# Patient Record
Sex: Female | Born: 1940
Health system: Southern US, Community
[De-identification: ages and names within clinical notes are randomized; demographics above are authoritative.]

## PROBLEM LIST (undated history)

## (undated) DIAGNOSIS — D72819 Decreased white blood cell count, unspecified: Secondary | ICD-10-CM

## (undated) DIAGNOSIS — N189 Chronic kidney disease, unspecified: Secondary | ICD-10-CM

## (undated) DIAGNOSIS — T7840XA Allergy, unspecified, initial encounter: Secondary | ICD-10-CM

## (undated) DIAGNOSIS — K219 Gastro-esophageal reflux disease without esophagitis: Secondary | ICD-10-CM

## (undated) DIAGNOSIS — E785 Hyperlipidemia, unspecified: Secondary | ICD-10-CM

## (undated) HISTORY — PX: TUBAL LIGATION: SHX77

## (undated) HISTORY — PX: TONSILLECTOMY: SUR1361

## (undated) HISTORY — PX: APPENDECTOMY: SHX54

## (undated) HISTORY — DX: Decreased white blood cell count, unspecified: D72.819

## (undated) HISTORY — PX: COLONOSCOPY: SHX174

## (undated) HISTORY — DX: Hyperlipidemia, unspecified: E78.5

## (undated) HISTORY — PX: VAGINAL HYSTERECTOMY: SUR661

## (undated) HISTORY — DX: Allergy, unspecified, initial encounter: T78.40XA

## (undated) HISTORY — DX: Chronic kidney disease, unspecified: N18.9

## (undated) HISTORY — DX: Gastro-esophageal reflux disease without esophagitis: K21.9

## (undated) HISTORY — PX: EYE SURGERY: SHX253

---

## 2003-01-25 ENCOUNTER — Other Ambulatory Visit: Admission: RE | Admit: 2003-01-25 | Discharge: 2003-01-25 | Payer: Self-pay | Admitting: Obstetrics and Gynecology

## 2003-05-27 ENCOUNTER — Ambulatory Visit (HOSPITAL_COMMUNITY): Admission: RE | Admit: 2003-05-27 | Discharge: 2003-05-27 | Payer: Self-pay | Admitting: Obstetrics and Gynecology

## 2003-05-27 ENCOUNTER — Encounter: Payer: Self-pay | Admitting: Obstetrics and Gynecology

## 2004-02-17 ENCOUNTER — Other Ambulatory Visit: Admission: RE | Admit: 2004-02-17 | Discharge: 2004-02-17 | Payer: Self-pay | Admitting: Obstetrics and Gynecology

## 2004-08-15 ENCOUNTER — Ambulatory Visit: Payer: Self-pay | Admitting: Internal Medicine

## 2004-08-18 ENCOUNTER — Ambulatory Visit: Payer: Self-pay | Admitting: Internal Medicine

## 2006-02-22 ENCOUNTER — Encounter: Admission: RE | Admit: 2006-02-22 | Discharge: 2006-02-22 | Payer: Self-pay | Admitting: Internal Medicine

## 2008-04-27 ENCOUNTER — Encounter: Admission: RE | Admit: 2008-04-27 | Discharge: 2008-04-27 | Payer: Self-pay | Admitting: Internal Medicine

## 2009-07-14 ENCOUNTER — Encounter: Admission: RE | Admit: 2009-07-14 | Discharge: 2009-07-14 | Payer: Self-pay | Admitting: Internal Medicine

## 2009-07-28 ENCOUNTER — Encounter: Admission: RE | Admit: 2009-07-28 | Discharge: 2009-07-28 | Payer: Self-pay | Admitting: Internal Medicine

## 2009-08-09 ENCOUNTER — Encounter (INDEPENDENT_AMBULATORY_CARE_PROVIDER_SITE_OTHER): Payer: Self-pay | Admitting: *Deleted

## 2010-08-27 ENCOUNTER — Encounter: Payer: Self-pay | Admitting: Internal Medicine

## 2010-08-31 ENCOUNTER — Other Ambulatory Visit: Payer: Self-pay | Admitting: Internal Medicine

## 2010-08-31 DIAGNOSIS — Z1239 Encounter for other screening for malignant neoplasm of breast: Secondary | ICD-10-CM

## 2010-09-05 NOTE — Letter (Signed)
Summary: Colonoscopy Date Change Letter  [facility]  [address] [place], [place] [address]   Phone: [phone]  Fax: [phone]      August 09, 2009 MRN: [id number]   [facility] [address] [place], [place]  [address]   Dear Ms. SHAKIR,   Previously you were recommended to have a repeat colonoscopy around this time. Your chart was recently reviewed by Dr. Hedwig Morton. Juanda Chance of  Gastroenterology. Follow up colonoscopy is now recommended in January 2013. This revised recommendation is based on current, nationally recognized guidelines for colorectal cancer screening and polyp surveillance. These guidelines are endorsed by the American Cancer Society, [employer] on Colorectal Cancer as well as numerous other major medical organizations.  Please understand that our recommendation assumes that you do not have any new symptoms such as bleeding, a change in bowel habits, anemia, or significant abdominal discomfort. If you do have any concerning GI symptoms or want to discuss the guideline recommendations, please call to arrange an office visit at your earliest convenience. Otherwise we will keep you in our reminder system and contact you 1-2 months prior to the date listed above to schedule your next colonoscopy.  Thank you,  Hedwig Morton. Juanda Chance, M.D.  [facility] [phone]

## 2010-09-18 ENCOUNTER — Ambulatory Visit: Payer: Self-pay

## 2010-09-22 ENCOUNTER — Ambulatory Visit
Admission: RE | Admit: 2010-09-22 | Discharge: 2010-09-22 | Disposition: A | Discharge status: Home / Self Care | Payer: MEDICARE | Source: Ambulatory Visit | Attending: Internal Medicine | Admitting: Internal Medicine

## 2010-09-22 DIAGNOSIS — Z1239 Encounter for other screening for malignant neoplasm of breast: Secondary | ICD-10-CM

## 2011-05-30 ENCOUNTER — Encounter (HOSPITAL_BASED_OUTPATIENT_CLINIC_OR_DEPARTMENT_OTHER): Payer: Medicare Other | Admitting: Hematology and Oncology

## 2011-05-30 ENCOUNTER — Other Ambulatory Visit: Payer: Self-pay | Admitting: Hematology and Oncology

## 2011-05-30 DIAGNOSIS — D709 Neutropenia, unspecified: Secondary | ICD-10-CM

## 2011-05-30 LAB — CBC WITH DIFFERENTIAL/PLATELET
BASO%: 0.6 % (ref 0.0–2.0)
EOS%: 5.7 % (ref 0.0–7.0)
LYMPH%: 45.1 % (ref 14.0–49.7)
MCH: 34.6 pg — ABNORMAL HIGH (ref 25.1–34.0)
MCHC: 34.3 g/dL (ref 31.5–36.0)
MCV: 101 fL (ref 79.5–101.0)
MONO%: 10.8 % (ref 0.0–14.0)
Platelets: 254 10*3/uL (ref 145–400)
RBC: 3.76 10*6/uL (ref 3.70–5.45)

## 2011-05-30 LAB — COMPREHENSIVE METABOLIC PANEL
BUN: 13 mg/dL (ref 6–23)
CO2: 26 mEq/L (ref 19–32)
Creatinine, Ser: 0.77 mg/dL (ref 0.50–1.10)
Glucose, Bld: 97 mg/dL (ref 70–99)
Sodium: 142 mEq/L (ref 135–145)
Total Bilirubin: 0.6 mg/dL (ref 0.3–1.2)
Total Protein: 6.9 g/dL (ref 6.0–8.3)

## 2011-05-30 LAB — MORPHOLOGY: RBC Comments: NORMAL

## 2011-06-01 ENCOUNTER — Other Ambulatory Visit: Payer: Self-pay | Admitting: Hematology and Oncology

## 2011-06-01 DIAGNOSIS — D72819 Decreased white blood cell count, unspecified: Secondary | ICD-10-CM

## 2011-06-01 LAB — PROTEIN ELECTROPHORESIS, SERUM
Alpha-2-Globulin: 8.2 % (ref 7.1–11.8)
Beta 2: 5.4 % (ref 3.2–6.5)
Beta Globulin: 6.2 % (ref 4.7–7.2)
Gamma Globulin: 22.2 % — ABNORMAL HIGH (ref 11.1–18.8)

## 2011-06-01 LAB — ANCA SCREEN W REFLEX TITER
Atypical p-ANCA Screen: NEGATIVE
c-ANCA Screen: NEGATIVE
p-ANCA Screen: NEGATIVE

## 2011-06-01 LAB — VITAMIN B12: Vitamin B-12: 693 pg/mL (ref 211–911)

## 2011-06-01 LAB — IGG, IGA, IGM: IgA: 232 mg/dL (ref 69–380)

## 2011-06-04 ENCOUNTER — Other Ambulatory Visit: Payer: Self-pay | Admitting: Hematology and Oncology

## 2011-06-04 ENCOUNTER — Ambulatory Visit (HOSPITAL_COMMUNITY)
Admission: RE | Admit: 2011-06-04 | Discharge: 2011-06-04 | Disposition: A | Discharge status: Home / Self Care | Payer: Medicare Other | Source: Ambulatory Visit | Attending: Hematology and Oncology | Admitting: Hematology and Oncology

## 2011-06-04 DIAGNOSIS — D709 Neutropenia, unspecified: Secondary | ICD-10-CM | POA: Insufficient documentation

## 2011-06-08 ENCOUNTER — Other Ambulatory Visit: Payer: Self-pay | Admitting: Hematology and Oncology

## 2011-06-08 ENCOUNTER — Ambulatory Visit (HOSPITAL_COMMUNITY)
Admission: RE | Admit: 2011-06-08 | Discharge: 2011-06-08 | Disposition: A | Discharge status: Home / Self Care | Payer: Medicare Other | Source: Ambulatory Visit | Attending: Hematology and Oncology | Admitting: Hematology and Oncology

## 2011-06-08 ENCOUNTER — Other Ambulatory Visit: Payer: Self-pay | Admitting: Interventional Radiology

## 2011-06-08 ENCOUNTER — Ambulatory Visit (HOSPITAL_COMMUNITY): Payer: Medicare Other

## 2011-06-08 DIAGNOSIS — E119 Type 2 diabetes mellitus without complications: Secondary | ICD-10-CM | POA: Insufficient documentation

## 2011-06-08 DIAGNOSIS — D72819 Decreased white blood cell count, unspecified: Secondary | ICD-10-CM | POA: Insufficient documentation

## 2011-06-08 LAB — CBC
HCT: 37.3 % (ref 36.0–46.0)
Hemoglobin: 12.8 g/dL (ref 12.0–15.0)
WBC: 2.6 10*3/uL — ABNORMAL LOW (ref 4.0–10.5)

## 2011-06-08 LAB — APTT: aPTT: 29 seconds (ref 24–37)

## 2011-06-09 ENCOUNTER — Encounter: Payer: Self-pay | Admitting: *Deleted

## 2011-06-14 ENCOUNTER — Other Ambulatory Visit (HOSPITAL_COMMUNITY): Payer: Medicare Other

## 2011-06-19 ENCOUNTER — Ambulatory Visit (HOSPITAL_BASED_OUTPATIENT_CLINIC_OR_DEPARTMENT_OTHER): Payer: Medicare Other | Admitting: Hematology and Oncology

## 2011-06-19 ENCOUNTER — Other Ambulatory Visit: Payer: Self-pay | Admitting: Hematology and Oncology

## 2011-06-19 ENCOUNTER — Other Ambulatory Visit: Payer: Medicare Other | Admitting: Lab

## 2011-06-19 ENCOUNTER — Telehealth: Payer: Self-pay | Admitting: Hematology and Oncology

## 2011-06-19 VITALS — BP 141/73 | HR 55 | Temp 97.8°F | Ht 61.0 in | Wt 153.6 lb

## 2011-06-19 DIAGNOSIS — D72819 Decreased white blood cell count, unspecified: Secondary | ICD-10-CM

## 2011-06-19 NOTE — Telephone Encounter (Signed)
gv pt appt schedule for aug 2013. Per LO aug f/u.

## 2011-06-19 NOTE — Progress Notes (Signed)
This office note has been dictated.

## 2011-06-19 NOTE — Progress Notes (Signed)
CC:   Ashley Lyons, M.D.  IDENTIFYING STATEMENT:  The patient is a 70 year old woman who presents to discuss results.  INTERVAL HISTORY:  In summary, Ashley Lyons was referred for leukopenia. She is asymptomatic.  She gave no past history for recurring infections. Her family history was negative for blood disorders.   Lab results were as follows: 05/30/2011:  White cell count 2.7, hemoglobin 13, hematocrit 38, platelets 254, ANC 1000; Review of peripheral smear revealed normal-appearing white blood cells, red blood cells and platelets.  Serum protein electrophoresis revealed a nonspecific diffuse polyclonal type and an M spike was not detected.  ANA and p-ANCA were negative.  Vitamin B12 was within the normal range of 693.  LDH 164.  An ultrasound of the abdomen on 06/04/2011 showed that the spleen was not enlarged.  The patient received a bone marrow biopsy with aspiration on 06/2011 that showed trilineage hematopoiesis.  The granulocytic precursors were decreased but showed orderly and progressive maturation. There were no increase in blastic cells.  Erythroid and megakaryocytes were abundant with orderly and progressive maturation.  There was no evidence of dyspoiesis.  Iron storage was present and ringed sideroblasts were absent.  MEDICATIONS:  Reviewed and updated.  PAST MEDICAL HISTORY:  unchanged.  SOCIAL HISTORY:  Unchanged.  FAMILY HISTORY:  Unchanged.  REVIEW OF SYSTEMS:  Ten-point review of systems negative.  PHYSICAL EXAMINATION:  The patient is a well-appearing, well-nourished woman in no distress.  vital signs:  Pulse 55, blood pressure 141/73, temperature 97.8, respirations 20, weight 153 pounds.  HEENT:  Head is atraumatic, normocephalic.  Sclerae anicteric.  Mouth moist.  Chest: Clear.  CVS:  Unremarkable.  ABDOMEN:  Soft, nontender.  Bowel sounds present.  EXTREMITIES:  No edema.  LYMPH NODES:  No adenopathy.  CNS: Nonfocal.  LABORATORY DATA:  As above.  In  addition, sodium 142, potassium 4.2, chloride 108, CO2 26, glucose 97.  Total bilirubin 0.6, alkaline phosphatase 45, AST 19, ALT 16, calcium 8.8.  IMPRESSION AND PLAN:  Ashley Lyons is a 70 year old woman with asymptomatic mild leukopenia.  She has no evidence of a dyspoietic findings on bone marrow exam.  I will not be recommending any treatment at this time except for surveillance.  She is to keep up to date with flu and pneumonia vaccines.  She follows up in 9 months' time.   ______________________________ Ashley Lyons, M.D. LIO/MEDQ  D:  06/19/2011  T:  06/19/2011  Job:  [id number]

## 2011-09-19 ENCOUNTER — Encounter: Payer: Self-pay | Admitting: Internal Medicine

## 2012-02-21 ENCOUNTER — Other Ambulatory Visit (HOSPITAL_COMMUNITY): Payer: Self-pay | Admitting: Internal Medicine

## 2012-02-21 ENCOUNTER — Encounter: Payer: Self-pay | Admitting: Internal Medicine

## 2012-02-21 ENCOUNTER — Ambulatory Visit
Admission: RE | Admit: 2012-02-21 | Discharge: 2012-02-21 | Disposition: A | Discharge status: Home / Self Care | Payer: Medicare Other | Source: Ambulatory Visit | Attending: Internal Medicine | Admitting: Internal Medicine

## 2012-02-21 DIAGNOSIS — Z1231 Encounter for screening mammogram for malignant neoplasm of breast: Secondary | ICD-10-CM

## 2012-02-22 ENCOUNTER — Ambulatory Visit (HOSPITAL_COMMUNITY): Payer: Medicare Other

## 2012-03-11 ENCOUNTER — Other Ambulatory Visit: Payer: Self-pay | Admitting: *Deleted

## 2012-03-11 ENCOUNTER — Telehealth: Payer: Self-pay | Admitting: *Deleted

## 2012-03-11 DIAGNOSIS — D72819 Decreased white blood cell count, unspecified: Secondary | ICD-10-CM

## 2012-03-11 NOTE — Telephone Encounter (Signed)
Spoke with pt today.  Gave pt new date and time for appts on 03/12/12  -  0830  Lab   And   0900  F/U.    Pt voiced understanding.

## 2012-03-11 NOTE — Telephone Encounter (Signed)
Per orders patient is aware of appointment time on 03-12-2012

## 2012-03-12 ENCOUNTER — Other Ambulatory Visit (HOSPITAL_BASED_OUTPATIENT_CLINIC_OR_DEPARTMENT_OTHER): Payer: Medicare Other

## 2012-03-12 ENCOUNTER — Encounter: Payer: Self-pay | Admitting: Nurse Practitioner

## 2012-03-12 ENCOUNTER — Telehealth: Payer: Self-pay | Admitting: *Deleted

## 2012-03-12 ENCOUNTER — Ambulatory Visit (HOSPITAL_BASED_OUTPATIENT_CLINIC_OR_DEPARTMENT_OTHER): Payer: Medicare Other | Admitting: Nurse Practitioner

## 2012-03-12 VITALS — BP 128/68 | HR 53 | Temp 99.0°F | Resp 20 | Ht 61.0 in | Wt 143.6 lb

## 2012-03-12 DIAGNOSIS — D72819 Decreased white blood cell count, unspecified: Secondary | ICD-10-CM

## 2012-03-12 DIAGNOSIS — D709 Neutropenia, unspecified: Secondary | ICD-10-CM

## 2012-03-12 LAB — COMPREHENSIVE METABOLIC PANEL
AST: 18 U/L (ref 0–37)
Albumin: 3.8 g/dL (ref 3.5–5.2)
Alkaline Phosphatase: 42 U/L (ref 39–117)
BUN: 14 mg/dL (ref 6–23)
Creatinine, Ser: 0.79 mg/dL (ref 0.50–1.10)
Potassium: 4.3 mEq/L (ref 3.5–5.3)

## 2012-03-12 LAB — CBC WITH DIFFERENTIAL/PLATELET
BASO%: 0.5 % (ref 0.0–2.0)
Basophils Absolute: 0 10*3/uL (ref 0.0–0.1)
EOS%: 3 % (ref 0.0–7.0)
HGB: 12.9 g/dL (ref 11.6–15.9)
MCH: 35.3 pg — ABNORMAL HIGH (ref 25.1–34.0)
MCHC: 34.3 g/dL (ref 31.5–36.0)
MCV: 103.1 fL — ABNORMAL HIGH (ref 79.5–101.0)
MONO%: 8.6 % (ref 0.0–14.0)
RDW: 13.5 % (ref 11.2–14.5)

## 2012-03-12 NOTE — Progress Notes (Signed)
[facility]  Telephone:([phone] [phone]   OFFICE PROGRESS NOTE   Cc:  Gwynneth Aliment, MD  DIAGNOSIS: Mild leukopenia  PAST THERAPY: Bone marrow biopsy and aspiration on 06/2011 showed trilineage hematopoiesis with no evidence of malignancy.  CURRENT THERAPY:Observation.  INTERVAL HISTORY: Ashley Lyons 71 y.o. female returns for follow up of mild, asymptomatic leukopenia. Since her last office visit on 06/20/2011 she has had no significant changes in her health apart from newly diagnosed food allergies (to peanuts, almonds, corn, strawberries, gluten, and string beans.) She states she does have numerous "skin disorders" which are related to her allergies. She relates taking Zantac, OTC Claritin and Benadryl to manage these. She does occasionally have diarrhea when she eats various foods she is allergic to.   Past Medical History  Diagnosis Date  . Leukopenia     History reviewed. No pertinent past surgical history.  Current Outpatient Prescriptions  Medication Sig Dispense Refill  . Clobetasol Propionate Emulsion 0.05 % topical foam       . metFORMIN (GLUCOPHAGE) 500 MG tablet Take 500 mg by mouth daily.        . ranitidine (ZANTAC) 150 MG tablet       . rosuvastatin (CRESTOR) 5 MG tablet Take 5 mg by mouth as directed. M W F         ALLERGIES:   has no known allergies.  REVIEW OF SYSTEMS: As noted in the HPI - occasional mild diarrhea & skin rash related to allergies. The rest of the 14-point review of system was negative.   Filed Vitals:   03/12/12 0922  BP: 128/68  Pulse: 53  Temp: 99 F (37.2 C)  Resp: 20   Wt Readings from Last 3 Encounters:  03/12/12 143 lb 9.6 oz (65.137 kg)  06/19/11 153 lb 9.6 oz (69.673 kg)  05/30/11 153 lb 11.2 oz (69.718 kg)   ECOG Performance status:   PHYSICAL EXAMINATION:   General:  well-nourished African American female in no acute distress.  Eyes:  no scleral icterus.  ENT:  There were no  oropharyngeal lesions.  Neck was without thyromegaly.  Lymphatics:  Negative cervical, supraclavicular or axillary adenopathy.  Respiratory: lungs were clear bilaterally without wheezing or crackles.  Cardiovascular:  Regular rate and rhythm, S1/S2, without murmur, rub or gallop.  There was no pedal edema.  GI:  abdomen was soft, flat, nontender, nondistended, without organomegaly.  Muscoloskeletal:  no spinal tenderness of palpation of vertebral spine.  Skin exam was without echymosis, petichae.  Neuro exam was nonfocal.  Patient was able to get on and off exam table without assistance.  Gait was normal.  Patient was alerted and oriented.  Attention was good.   Language was appropriate.  Mood was normal without depression.  Speech was not pressured.  Thought content was not tangential.     LABORATORY/RADIOLOGY DATA:  Lab Results  Component Value Date   WBC 3.0* 03/12/2012   HGB 12.9 03/12/2012   HCT 37.8 03/12/2012   PLT 275 03/12/2012   GLUCOSE 97 05/30/2011   ALKPHOS 45 05/30/2011   ALT 16 05/30/2011   AST 19 05/30/2011   NA 142 05/30/2011   K 4.2 05/30/2011   CL 108 05/30/2011   CREATININE 0.77 05/30/2011   BUN 13 05/30/2011   CO2 26 05/30/2011   INR 1.18 06/08/2011    Mm Digital Screening  02/22/2012  *RADIOLOGY REPORT*  Clinical Data: Screening.  DIGITAL SCREENING MAMMOGRAM WITH CAD  Comparison:  Previous exams  Findings:  The breast tissue is heterogeneously dense. No suspicious masses, architectural distortion, or calcifications are present.  Images were processed with CAD.  IMPRESSION: No mammographic evidence of malignancy.  A result letter of this screening mammogram will be mailed directly to the patient.  RECOMMENDATION: Screening mammogram in one year. (Code:SM-B-01Y)  BI-RADS CATEGORY 1:  Negative  Original Report Authenticated By: Britta Mccreedy, M.D.       ASSESSMENT AND PLAN: Mild leukopenia which seems to be resolving spontaneously. Patient's ANC today is 1.7, last November it  was 1.0- her WBC today is 3.0 -- last November 2012 it was 2.7. She has not noticed any predisposition towards infections and her medical history is largely unchanged except for new food allergies. We will see her back in one year, and if she continues to have labwork improve, will discharge her at that point to be followed by her primary care physician. We certainly are happy to see her prior to that time should need arise.   This patient was discussed with Dr. Arlan Organ. The length of time of the face-to-face encounter was 15   minutes. More than 50% of time was spent counseling and coordination of care.   Bobbe Medico, AOCNP, NP-C

## 2012-03-12 NOTE — Telephone Encounter (Signed)
Gave patient appointment for 03-18-2013 starting at 8:30 am

## 2012-03-13 ENCOUNTER — Other Ambulatory Visit: Payer: Medicare Other | Admitting: Lab

## 2012-03-18 ENCOUNTER — Ambulatory Visit: Payer: Medicare Other | Admitting: Hematology and Oncology

## 2012-04-04 ENCOUNTER — Telehealth: Payer: Self-pay | Admitting: *Deleted

## 2012-04-04 ENCOUNTER — Encounter: Payer: Self-pay | Admitting: Internal Medicine

## 2012-04-04 ENCOUNTER — Ambulatory Visit (AMBULATORY_SURGERY_CENTER): Payer: Medicare Other | Admitting: *Deleted

## 2012-04-04 VITALS — Ht 63.0 in | Wt 141.9 lb

## 2012-04-04 DIAGNOSIS — Z860100 Personal history of colon polyps, unspecified: Secondary | ICD-10-CM

## 2012-04-04 DIAGNOSIS — Z1211 Encounter for screening for malignant neoplasm of colon: Secondary | ICD-10-CM

## 2012-04-04 MED ORDER — PEG-KCL-NACL-NASULF-NA ASC-C 100 G PO SOLR: ORAL | Status: DC

## 2012-04-04 NOTE — Progress Notes (Signed)
Pt has a soy allergy.  She states that she gets hives when she has soy products.  Note sent to Dr. Juanda Chance as an Lorain Childes, also to Cathlyn Parsons CRNA

## 2012-04-04 NOTE — Telephone Encounter (Signed)
Pt has a soy allergy.  She states that she gets hives when she has soy products.  She has had Fentanyl and Versed in the past and tolerated that well.

## 2012-04-07 NOTE — Telephone Encounter (Signed)
OK to use Fentanyl/Versed

## 2012-04-18 ENCOUNTER — Ambulatory Visit (AMBULATORY_SURGERY_CENTER): Payer: Medicare Other | Admitting: Internal Medicine

## 2012-04-18 ENCOUNTER — Encounter: Payer: Self-pay | Admitting: Internal Medicine

## 2012-04-18 VITALS — BP 140/62 | HR 51 | Temp 96.9°F | Resp 10 | Ht 63.0 in | Wt 141.0 lb

## 2012-04-18 DIAGNOSIS — D126 Benign neoplasm of colon, unspecified: Secondary | ICD-10-CM

## 2012-04-18 DIAGNOSIS — Z1211 Encounter for screening for malignant neoplasm of colon: Secondary | ICD-10-CM

## 2012-04-18 MED ORDER — SODIUM CHLORIDE 0.9 % IV SOLN: 500.0000 mL | INTRAVENOUS | Status: DC

## 2012-04-18 NOTE — Patient Instructions (Addendum)
YOU HAD AN ENDOSCOPIC PROCEDURE TODAY AT THE [facility]: Refer to the procedure report that was given to you for any specific questions about what was found during the examination.  If the procedure report does not answer your questions, please call your gastroenterologist to clarify.  If you requested that your care partner not be given the details of your procedure findings, then the procedure report has been included in a sealed envelope for you to review at your convenience later.  YOU SHOULD EXPECT: Some feelings of bloating in the abdomen. Passage of more gas than usual.  Walking can help get rid of the air that was put into your GI tract during the procedure and reduce the bloating. If you had a lower endoscopy (such as a colonoscopy or flexible sigmoidoscopy) you may notice spotting of blood in your stool or on the toilet paper. If you underwent a bowel prep for your procedure, then you may not have a normal bowel movement for a few days.  DIET: Your first meal following the procedure should be a light meal and then it is ok to progress to your normal diet.  A half-sandwich or bowl of soup is an example of a good first meal.  Heavy or fried foods are harder to digest and may make you feel nauseous or bloated.  Likewise meals heavy in dairy and vegetables can cause extra gas to form and this can also increase the bloating.  Drink plenty of fluids but you should avoid alcoholic beverages for 24 hours.  ACTIVITY: Your care partner should take you home directly after the procedure.  You should plan to take it easy, moving slowly for the rest of the day.  You can resume normal activity the day after the procedure however you should NOT DRIVE or use heavy machinery for 24 hours (because of the sedation medicines used during the test).    SYMPTOMS TO REPORT IMMEDIATELY: A gastroenterologist can be reached at any hour.  During normal business hours, 8:30 AM to 5:00 PM Monday through Friday,  call [phone].  After hours and on weekends, please call the GI answering service at [phone] who will take a message and have the physician on call contact you.   Following lower endoscopy (colonoscopy or flexible sigmoidoscopy):  Excessive amounts of blood in the stool  Significant tenderness or worsening of abdominal pains  Swelling of the abdomen that is new, acute  Fever of 100F or higher  FOLLOW UP: If any biopsies were taken you will be contacted by phone or by letter within the next 1-3 weeks.  Call your gastroenterologist if you have not heard about the biopsies in 3 weeks.  Our staff will call the home number listed on your records the next business day following your procedure to check on you and address any questions or concerns that you may have at that time regarding the information given to you following your procedure. This is a courtesy call and so if there is no answer at the home number and we have not heard from you through the emergency physician on call, we will assume that you have returned to your regular daily activities without incident.  Resume your normal medications  Follow high fiber diet- see handout   SIGNATURES/CONFIDENTIALITY: You and/or your care partner have signed paperwork which will be entered into your electronic medical record.  These signatures attest to the fact that that the information above on your After Visit Summary has  been reviewed and is understood.  Full responsibility of the confidentiality of this discharge information lies with you and/or your care-partner.  

## 2012-04-18 NOTE — Progress Notes (Signed)
Patient did not experience any of the following events: a burn prior to discharge; a fall within the facility; wrong site/side/patient/procedure/implant event; or a hospital transfer or hospital admission upon discharge from the facility. (G8907) Patient did not have preoperative order for IV antibiotic SSI prophylaxis. (G8918)  

## 2012-04-18 NOTE — Op Note (Signed)
[facility] [address] [place], [address]   COLONOSCOPY PROCEDURE REPORT  PATIENT: Ashley, Lyons  MR#: [id number] BIRTHDATE: [birth year] , 71  yrs. old GENDER: Female ENDOSCOPIST: Hart Carwin, MD REFERRED BY:  Dorothyann Peng, M.D. PROCEDURE DATE:  04/18/2012 PROCEDURE:   Colonoscopy with cold biopsy polypectomy ASA CLASS:   Class II INDICATIOns: screening, average risk MEDICATIONS: MAC sedation, administered by CRNA,), and These medications were titrated to patient response per physician's verbal order  , Versed 5 mg IV, fentanyl 50 ug IV  DESCRIPTION OF PROCEDURE:   After the risks and benefits and of the procedure were explained, informed consent was obtained.  A digital rectal exam revealed no abnormalities of the rectum.    The LB [id number]  endoscope was introduced through the anus and advanced to the cecum, which was identified by both the appendix and ileocecal valve .  The quality of the prep was good, using MoviPrep .  The instrument was then slowly withdrawn as the colon was fully examined.     COLON FINDINGS: 3-5 mm flat polyp at 20 cm, removed with cold biopsies.   Mild diverticulosis was noted in the sigmoid colon. Retroflexed views revealed no abnormalities.     The scope was then withdrawn from the patient and the procedure completed.  COMPLICATIONS: There were no complications. ENDOSCOPIC IMPRESSION: 1.   3-5 mm flat polyp at 20 cm, removed with cold biopsies 2.   Mild diverticulosis was noted in the sigmoid colon  RECOMMENDATIONS: 1.  await pathology results 2.  High fiber diet   REPEAT EXAM: In 10 year(s)  for Colonoscopy.  cc:  _______________________________ eSignedHart Carwin, MD 04/18/2012 9:45 AM     PATIENT NAME:  Ashley, Lyons MR#: [id number]

## 2012-04-21 ENCOUNTER — Telehealth: Payer: Self-pay

## 2012-04-21 NOTE — Telephone Encounter (Signed)
Left message on answering machine. 

## 2012-04-22 ENCOUNTER — Encounter: Payer: Self-pay | Admitting: Internal Medicine

## 2012-05-01 ENCOUNTER — Other Ambulatory Visit: Payer: Self-pay

## 2012-09-27 ENCOUNTER — Telehealth: Payer: Self-pay | Admitting: Oncology

## 2012-09-27 ENCOUNTER — Encounter: Payer: Self-pay | Admitting: Oncology

## 2012-09-27 NOTE — Telephone Encounter (Signed)
LMONVM ADVISING THE PT OF HER REASSIGNED MD FROM DR ODOGWU TO DR SHADAD ALONG WITH AN APPT CALENDAR TO FOLLOW

## 2013-03-12 ENCOUNTER — Other Ambulatory Visit (HOSPITAL_BASED_OUTPATIENT_CLINIC_OR_DEPARTMENT_OTHER): Payer: Medicare Other | Admitting: Lab

## 2013-03-12 ENCOUNTER — Ambulatory Visit (HOSPITAL_BASED_OUTPATIENT_CLINIC_OR_DEPARTMENT_OTHER): Payer: Medicare Other | Admitting: Oncology

## 2013-03-12 ENCOUNTER — Other Ambulatory Visit: Payer: Medicare Other | Admitting: Lab

## 2013-03-12 ENCOUNTER — Ambulatory Visit: Payer: Medicare Other | Admitting: Hematology and Oncology

## 2013-03-12 VITALS — BP 141/68 | HR 51 | Temp 98.4°F | Resp 20 | Ht 63.0 in | Wt 145.5 lb

## 2013-03-12 DIAGNOSIS — D72819 Decreased white blood cell count, unspecified: Secondary | ICD-10-CM

## 2013-03-12 LAB — COMPREHENSIVE METABOLIC PANEL (CC13)
ALT: 11 U/L (ref 0–55)
Albumin: 3.5 g/dL (ref 3.5–5.0)
CO2: 24 mEq/L (ref 22–29)
Calcium: 9 mg/dL (ref 8.4–10.4)
Chloride: 109 mEq/L (ref 98–109)
Creatinine: 0.9 mg/dL (ref 0.6–1.1)
Total Protein: 7.2 g/dL (ref 6.4–8.3)

## 2013-03-12 LAB — CBC WITH DIFFERENTIAL/PLATELET
BASO%: 0.5 % (ref 0.0–2.0)
HCT: 38.1 % (ref 34.8–46.6)
HGB: 13 g/dL (ref 11.6–15.9)
MCHC: 34.1 g/dL (ref 31.5–36.0)
MONO#: 0.4 10*3/uL (ref 0.1–0.9)
NEUT%: 42.7 % (ref 38.4–76.8)
WBC: 3 10*3/uL — ABNORMAL LOW (ref 3.9–10.3)
lymph#: 1.2 10*3/uL (ref 0.9–3.3)

## 2013-03-12 NOTE — Progress Notes (Signed)
[facility]  Telephone:(336) [phone] Fax:(336) [phone]   OFFICE PROGRESS NOTE   Cc:  Gwynneth Aliment, MD  DIAGNOSIS: 72 year old with mild leukopenia diagnosed in 2012. Likely reactive vs ethnic variation.   PAST THERAPY: Bone marrow biopsy and aspiration on 06/2011 showed trilineage hematopoiesis with no evidence of malignancy.  CURRENT THERAPY:Observation.  INTERVAL HISTORY: Ashley Lyons 72 y.o. female returns for follow up of mild, asymptomatic leukopenia. Since her last office visit,  she has had no significant changes. She reports no hospitalizations or illnesses. She reports no infections or generalized symptoms. She continues to be active with any decline in energy or performance status. She lost weight and off of her diabetic medications.   Past Medical History  Diagnosis Date  . Leukopenia   . Allergy   . Glaucoma     right eye  . Diabetes mellitus   . GERD (gastroesophageal reflux disease)   . Hyperlipidemia     Past Surgical History  Procedure Laterality Date  . Colonoscopy    . Vaginal hysterectomy    . Appendectomy    . Tonsillectomy    . Tubal ligation      Current Outpatient Prescriptions  Medication Sig Dispense Refill  . Clobetasol Propionate Emulsion 0.05 % topical foam as needed.       . DiphenhydrAMINE HCl (BENADRYL ALLERGY PO) Take 25 mg by mouth as needed.      . Loratadine 10 MG CAPS Take by mouth daily.      . metFORMIN (GLUCOPHAGE) 500 MG tablet Take 500 mg by mouth daily.        . NON FORMULARY Eye drops- 1 drop right eye daily      . ranitidine (ZANTAC) 150 MG tablet daily.       . rosuvastatin (CRESTOR) 5 MG tablet Take 5 mg by mouth as directed. M W F        No current facility-administered medications for this visit.    ALLERGIES:  is allergic to almond oil; bean pod extract; corn-containing products; peanuts; sodium laureth sulfate; soy allergy; and strawberry.  REVIEW OF SYSTEMS: As noted in the HPI   Filed  Vitals:   03/12/13 0847  BP: 141/68  Pulse: 51  Temp: 98.4 F (36.9 C)  Resp: 20   Wt Readings from Last 3 Encounters:  03/12/13 145 lb 8 oz (65.998 kg)  04/18/12 141 lb (63.957 kg)  04/04/12 141 lb 14.4 oz (64.365 kg)   ECOG Performance status: 0  PHYSICAL EXAMINATION:   General:  well-nourished African American female in no acute distress.  Eyes:  no scleral icterus.  ENT:  There were no oropharyngeal lesions.  Neck was without thyromegaly.  Lymphatics:  Negative cervical, supraclavicular or axillary adenopathy.  Respiratory: lungs were clear bilaterally without wheezing or crackles.  Cardiovascular:  Regular rate and rhythm, S1/S2, without murmur, rub or gallop.  There was no pedal edema.  GI:  abdomen was soft, flat, nontender, nondistended, without organomegaly.  Muscoloskeletal:  no spinal tenderness of palpation of vertebral spine.  Skin exam was without echymosis, petichae.  Neuro exam was nonfocal.  Patient was able to get on and off exam table without assistance.  Gait was normal.  Patient was alerted and oriented.  Attention was good.   Language was appropriate.  Mood was normal without depression.    LABORATORY/RADIOLOGY DATA:  Lab Results  Component Value Date   WBC 3.0* 03/12/2013   HGB 13.0 03/12/2013   HCT  38.1 03/12/2013   PLT 261 03/12/2013   GLUCOSE 111* 03/12/2012   ALKPHOS 42 03/12/2012   ALT 14 03/12/2012   AST 18 03/12/2012   NA 139 03/12/2012   K 4.3 03/12/2012   CL 106 03/12/2012   CREATININE 0.79 03/12/2012   BUN 14 03/12/2012   CO2 23 03/12/2012   INR 1.18 06/08/2011      ASSESSMENT AND PLAN:   72 year old with:  Mild leukopenia which seems to be resolving spontaneously. Patient's ANC today is 1.3. Here work up failed to show any blood disorder and likely due to ethnic variation. I see no need for a regular follow up or further work up.  We certainly are happy to see her in the future should need arise.   [facility] MD 03/12/2013

## 2013-03-12 NOTE — Addendum Note (Signed)
Addended by: Reesa Chew on: 03/12/2013 09:22 AM   Modules accepted: Orders, Medications

## 2013-12-01 ENCOUNTER — Other Ambulatory Visit: Payer: Self-pay

## 2013-12-01 DIAGNOSIS — Z1231 Encounter for screening mammogram for malignant neoplasm of breast: Secondary | ICD-10-CM

## 2013-12-16 ENCOUNTER — Encounter (INDEPENDENT_AMBULATORY_CARE_PROVIDER_SITE_OTHER): Payer: Self-pay

## 2013-12-16 ENCOUNTER — Ambulatory Visit
Admission: RE | Admit: 2013-12-16 | Discharge: 2013-12-16 | Disposition: A | Discharge status: Home / Self Care | Payer: Medicare Other | Source: Ambulatory Visit

## 2013-12-16 DIAGNOSIS — Z1231 Encounter for screening mammogram for malignant neoplasm of breast: Secondary | ICD-10-CM

## 2014-12-07 ENCOUNTER — Encounter: Payer: Self-pay | Admitting: Internal Medicine

## 2015-04-14 DIAGNOSIS — N182 Chronic kidney disease, stage 2 (mild): Secondary | ICD-10-CM | POA: Diagnosis not present

## 2015-04-14 DIAGNOSIS — E1122 Type 2 diabetes mellitus with diabetic chronic kidney disease: Secondary | ICD-10-CM | POA: Diagnosis not present

## 2015-04-14 DIAGNOSIS — N08 Glomerular disorders in diseases classified elsewhere: Secondary | ICD-10-CM | POA: Diagnosis not present

## 2015-04-14 DIAGNOSIS — Z23 Encounter for immunization: Secondary | ICD-10-CM | POA: Diagnosis not present

## 2015-04-14 DIAGNOSIS — Z6829 Body mass index (BMI) 29.0-29.9, adult: Secondary | ICD-10-CM | POA: Diagnosis not present

## 2015-05-13 DIAGNOSIS — H401131 Primary open-angle glaucoma, bilateral, mild stage: Secondary | ICD-10-CM | POA: Diagnosis not present

## 2015-05-13 DIAGNOSIS — H25813 Combined forms of age-related cataract, bilateral: Secondary | ICD-10-CM | POA: Diagnosis not present

## 2015-05-16 ENCOUNTER — Ambulatory Visit (INDEPENDENT_AMBULATORY_CARE_PROVIDER_SITE_OTHER): Payer: Medicare Other | Admitting: Internal Medicine

## 2015-05-16 VITALS — BP 150/78 | HR 83 | Temp 98.4°F | Resp 16 | Ht 60.5 in | Wt 149.0 lb

## 2015-05-16 DIAGNOSIS — L259 Unspecified contact dermatitis, unspecified cause: Secondary | ICD-10-CM

## 2015-05-16 DIAGNOSIS — L299 Pruritus, unspecified: Secondary | ICD-10-CM | POA: Diagnosis not present

## 2015-05-16 DIAGNOSIS — Z91048 Other nonmedicinal substance allergy status: Secondary | ICD-10-CM | POA: Diagnosis not present

## 2015-05-16 DIAGNOSIS — Z9109 Other allergy status, other than to drugs and biological substances: Secondary | ICD-10-CM

## 2015-05-16 MED ORDER — DERMA-SMOOTHE/FS BODY 0.01 % EX OIL: 0.0100 "application " | TOPICAL_OIL | Freq: Three times a day (TID) | CUTANEOUS | Status: DC

## 2015-05-16 MED ORDER — CLOBETASOL PROPIONATE 0.05 % EX OINT: 1.0000 "application " | TOPICAL_OINTMENT | Freq: Two times a day (BID) | CUTANEOUS | Status: DC

## 2015-05-16 MED ORDER — METHYLPREDNISOLONE ACETATE 80 MG/ML IJ SUSP
80.0000 mg | Freq: Once | INTRAMUSCULAR | Status: AC
Start: 2015-05-16 — End: 2015-05-16
  Administered 2015-05-16: 80 mg via INTRAMUSCULAR

## 2015-05-16 NOTE — Progress Notes (Signed)
Patient ID: Ashley Lyons, female   DOB: Feb 08, 1941, 74 y.o.   MRN: [id number]   05/16/2015 at 10:26 AM  Ashley Lyons / DOB: Mar 17, 1941 / MRN: [id number]  Problem list reviewed and updated by me where necessary.   SUBJECTIVE  Ashley Lyons is a 74 y.o. ill appearing female presenting for the chief complaint of itchy rash face, scalp, chest, and arms. This recurrent and she has severe environmental allergys, food allergy and contact allergy.  She has had many dermatologists. What helps is strong cortisone ointments and Derma smooth on scalp. Oral AHs help itching. She requests depomedrol..  Her dermatologists prescribes, derma-smoothe/FS and says penut allergy not a problem with this.   She  has a past medical history of Leukopenia; Allergy; Glaucoma; Diabetes mellitus; GERD (gastroesophageal reflux disease); and Hyperlipidemia.    Medications reviewed and updated by myself where necessary, and exist elsewhere in the encounter.   Ashley Lyons is allergic to almond oil; bean pod extract; corn-containing products; peanuts; sodium laureth sulfate; soy allergy; and strawberry. She  reports that she has never smoked. She has never used smokeless tobacco. She reports that she does not drink alcohol or use illicit drugs. She  has no sexual activity history on file. The patient  has past surgical history that includes Colonoscopy; Vaginal hysterectomy; Appendectomy; Tonsillectomy; and Tubal ligation.  Her family history includes Prostate cancer in her brother and father. There is no history of Colon cancer, Esophageal cancer, Rectal cancer, or Stomach cancer.  Review of Systems  Constitutional: Negative.  Negative for fever.  HENT: Negative.   Eyes: Negative.   Respiratory: Negative for shortness of breath.   Cardiovascular: Negative for chest pain.  Gastrointestinal: Negative for nausea.  Skin: Positive for itching and rash.  Neurological: Negative for dizziness and headaches.    Endo/Heme/Allergies: Positive for environmental allergies.  Psychiatric/Behavioral: Negative.     OBJECTIVE  Her  height is 5' 0.5" (1.537 m) and weight is 149 lb (67.586 kg). Her oral temperature is 98.4 F (36.9 C). Her blood pressure is 150/78 and her pulse is 83. Her respiration is 16 and oxygen saturation is 96%.  The patient's body mass index is 28.61 kg/(m^2).  Physical Exam  Constitutional: She is oriented to person, place, and time. She appears well-developed and well-nourished. She appears distressed.  HENT:  Head: Normocephalic.  Mouth/Throat: Oropharynx is clear and moist.  Eyes: Conjunctivae and EOM are normal. No scleral icterus.  Neck: Normal range of motion. Neck supple.  Cardiovascular: Normal rate and regular rhythm.   Respiratory: Effort normal and breath sounds normal.  GI: She exhibits no distension.  Musculoskeletal: Normal range of motion.  Lymphadenopathy:    She has no cervical adenopathy.  Neurological: She is alert and oriented to person, place, and time. Coordination normal.  Skin: Skin is warm, dry and intact. Rash is not pustular and not urticarial. There is erythema.     Psychiatric: She has a normal mood and affect.    No results found for this or any previous visit (from the past 24 hour(s)).  ASSESSMENT & PLAN  Ashley Lyons was seen today for rash and belepharitis.  Diagnoses and all orders for this visit:  Contact dermatitis -     methylPREDNISolone acetate (DEPO-MEDROL) injection 80 mg; Inject 1 mL (80 mg total) into the muscle once. -     Fluocinolone Acetonide (DERMA-SMOOTHE/FS BODY) 0.01 % OIL; Apply 5.10 application topically 3 (three) times daily. -     clobetasol ointment (  TEMOVATE) 0.05 %; Apply 1 application topically 2 (two) times daily. Apply bid to rash till gone  Avoid face, genitals.  Allergy to environmental factors -     methylPREDNISolone acetate (DEPO-MEDROL) injection 80 mg; Inject 1 mL (80 mg total) into the muscle once. -      Fluocinolone Acetonide (DERMA-SMOOTHE/FS BODY) 0.01 % OIL; Apply 3.61 application topically 3 (three) times daily. -     clobetasol ointment (TEMOVATE) 0.05 %; Apply 1 application topically 2 (two) times daily. Apply bid to rash till gone  Avoid face, genitals.  Itch of skin -     methylPREDNISolone acetate (DEPO-MEDROL) injection 80 mg; Inject 1 mL (80 mg total) into the muscle once. -     Fluocinolone Acetonide (DERMA-SMOOTHE/FS BODY) 0.01 % OIL; Apply 4.43 application topically 3 (three) times daily. -     clobetasol ointment (TEMOVATE) 0.05 %; Apply 1 application topically 2 (two) times daily. Apply bid to rash till gone  Avoid face, genitals.

## 2015-05-16 NOTE — Patient Instructions (Signed)
Food Allergy A food allergy is when your body reacts to a food in a way that is not normal. The reaction can be gentle or very bad. Signs of a Gentle Reaction  Stuffy nose.  Tingling in the mouth.  An itchy, red rash.  Throwing up (vomiting).  Watery poop (diarrhea). Signs of a Very Bad Reaction  Puffiness (swelling). This may be on the lips, face, or tongue, or in the mouth or throat.  Breathing loudly (wheezing).  A hoarse voice.  Itchy, red, swollen areas of skin (hives).  Dizziness or light-headedness.  Fainting.  Trouble breathing or swallowing.  A tight feeling in the chest.  A very fast heartbeat. HOME CARE General Instructions  Avoid the foods that you are allergic to.  Read food labels. Look for ingredients that you are allergic to.  When you are at a restaurant, tell your server that you have an allergy. Ask if your meal has an ingredient that you are allergic to.  Take medicines only as told by your doctor. Do not drive until the medicine has worn off, unless your doctor says it is okay.  Tell all people who care for you that you have a food allergy. This includes your doctor and dentist.  If you think that you might be allergic to something else, talk with your doctor. Do not eat a food to see if you are allergic to it without talking with your doctor first. If you have a very bad allergy:  Wear a bracelet or necklace that says you have an allergy.  Carry your allergy kit (anaphylaxis kit) or an allergy shot (epinephrine injection) with you all the time. Use them as told by your doctor.  Make sure that you, your family, and your boss know:  How to use your allergy kit.  How to give you an allergy shot.  If you use the medicine epinephrine:  Get more right away in case you have another reaction.  Get help. You can have a life-threatening reaction after taking the medicine. If you are being tested for an allergy:  Follow a diet as told by  your doctor.  Keep a food diary as told by your doctor. Every day, write down:  What you eat and drink and when.  What problems you have and when. GET HELP IF:  The signs of your reaction have not gone away within 2 days.  The signs of your reaction get worse.  You have new signs of a reaction. GET HELP RIGHT AWAY IF:  You use the medicine epinephrine.  You are having a very bad reaction. Signs of a very bad reaction are:  Puffiness. This may be on the lips, face, or tongue, or in the mouth or throat.  Breathing loudly.  A hoarse voice.  Itchy, red swollen areas of skin.  Dizziness or light-headedness.  Fainting.  Trouble breathing or swallowing.  A tight feeling in the chest.  A very fast heartbeat.   This information is not intended to replace advice given to you by your health care provider. Make sure you discuss any questions you have with your health care provider.   Document Released: 01/10/2010 Document Revised: 04/13/2015 Document Reviewed: 05/04/2014 Elsevier Interactive Patient Education 2016 [employer]. Drug Allergy Allergic reactions to medicines are common. Some allergic reactions are mild. A delayed type of drug allergy that occurs 1 week or more after exposure to a medicine or vaccine is called serum sickness. A life-threatening, sudden (acute) allergic reaction  that involves the whole body is called anaphylaxis. CAUSES  "True" drug allergies occur when there is an allergic reaction to a medicine. This is caused by overactivity of the immune system. First, the body becomes sensitized. The immune system is triggered by your first exposure to the medicine. Following this first exposure, future exposure to the same medicine may be life-threatening. Almost any medicine can cause an allergic reaction. Common ones are:  Penicillin.  Sulfonamides (sulfa drugs).  Local anesthetics.  X-ray dyes that contain iodine. SYMPTOMS  Common symptoms of a minor  allergic reaction are:  Swelling around the mouth.  An itchy red rash or hives.  Vomiting or diarrhea. Anaphylaxis can cause swelling of the mouth and throat. This makes it difficult to breathe and swallow. Severe reactions can be fatal within seconds, even after exposure to only a trace amount of the drug that causes the reaction. HOME CARE INSTRUCTIONS  If you are unsure of what caused your reaction, write down:  The names of the medicines you took.  How much medicine you took.  How you took the medicine, such as whether you took a pill, injected the medicine, or applied it to your skin.  All of the things you ate and drank.  The date and time of your reaction.  The symptoms of the reaction.  You may want to follow up with an allergy specialist after the reaction has cleared in order to be tested to confirm the allergy. It is important to confirm that your reaction is an allergy, not just a side effect to the medicine. If you have a true allergy to a medicine, this may prevent that medicine and related medicines from being given to you when you are very ill.  If you have hives or a rash:  Take medicines as directed by your caregiver.  You may use an over-the-counter antihistamine (diphenhydramine) as needed.  Apply cold compresses to the skin or take baths in cool water. Avoid hot baths or showers.  If you are severely allergic:  Continuous observation after a severe reaction may be needed. Hospitalization is often required.  Wear a medical alert bracelet or necklace stating your allergy.  You and your family must learn how to use an anaphylaxis kit or give an epinephrine injection to temporarily treat an emergency allergic reaction. If you have had a severe reaction, always carry your epinephrine injection or anaphylaxis kit with you. This can be lifesaving if you have a severe reaction.  Do not drive or perform tasks after treatment until the medicines used to treat your  reaction have worn off, or until your caregiver says it is okay.  If you have a drug allergy that was confirmed by your health care provider:  Carry information about the drug allergy with you at all times.  Always check with a pharmacist before taking any over-the-counter medicine. SEEK MEDICAL CARE IF:   You think you had an allergic reaction. Symptoms usually start within 30 minutes after exposure.  Symptoms are getting worse rather than better.  You develop new symptoms.  The symptoms that brought you to your caregiver return. SEEK IMMEDIATE MEDICAL CARE IF:   You have swelling of the mouth, difficulty breathing, or wheezing.  You have a tight feeling in your chest or throat.  You develop hives, swelling, or itching all over your body.  You develop severe vomiting or diarrhea.  You feel faint or pass out. This is an emergency. Use your epinephrine injection or anaphylaxis  kit as you have been instructed. Call for emergency medical help. Even if you improve after the injection, you need to be examined at a hospital emergency department. MAKE SURE YOU:   Understand these instructions.  Will watch your condition.  Will get help right away if you are not doing well or get worse.   This information is not intended to replace advice given to you by your health care provider. Make sure you discuss any questions you have with your health care provider.   Document Released: 07/23/2005 Document Revised: 08/13/2014 Document Reviewed: 02/22/2015 Elsevier Interactive Patient Education 2016 [employer] Allergy A seafood allergy is when you have a reaction to fish or shellfish. When you have an allergy, your body's defense system (immune system) overreacts to a substance that is normally not harmful (allergen). If you are allergic to seafood, your body reacts to fish or shellfish as though these were dangerous substances. In some cases, a fish or shellfish allergy can cause a  severe, life-threatening reaction that may make it hard to breathe (anaphylaxis). A shellfish allergy is one of the most common types of allergy. Certain types of shellfish are more likely to cause a reaction. These include crab, lobster, and shrimp. A shellfish allergy often does not start until the person is an adult. It is less common to have an allergy to finned fish, such as tuna, halibut, or salmon. A fish allergy also often does not start until the person is an adult. Being allergic to shellfish does not mean you will be allergic to finned fish. The opposite is also true. You might have just one of these allergies, or you might be allergic to both fish and shellfish. CAUSES  A seafood allergy is caused by your immune system's overreaction to a protein in seafood. Allergy symptoms are caused when your immune system releases chemicals in response to that protein. RISK FACTORS You may be at greater risk for a seafood allergy if you already have another type of allergy. SIGNS AND SYMPTOMS  Symptoms of a shellfish allergy include:  Itching or swelling around your mouth.  Wheezing.  Coughing.  Feeling that your throat is tight.  Trouble swallowing.  Hives.  Stomach cramps.  Vomiting.  Diarrhea.  A weak pulse.  Turning pale.  Feeling confused or dizzy. Symptoms of a fish allergy include:  Skin rashes or hives.  Stomach cramps.  Nausea and vomiting.  Diarrhea.  Nasal congestion.  Sneezing.  Headaches.  Asthma.  Trouble breathing.  Feeling confused or dizzy. DIAGNOSIS  Your health care provider may suspect a seafood allergy based on your signs and symptoms. A physical exam will be done. You may have to see a health care provider who specializes in treating allergies (allergist). This health care provider may order tests to help confirm the diagnosis. These may include:  Blood tests.  Blood tests may be used to check for antibodies to the fish or shellfish  being tested. Antibodies are proteins that your body produces to protect you from germs and other things that can make you sick.  Skin prick tests.  In this test, a small amount of a liquid containing the allergy-causing substance is put on your arm.  This spot is then pricked with a germ-free (sterile) needle so some of the liquid can get into your skin.  If a reddish spot appears after about 15 minutes, it could mean you are allergic to that substance.  An oral food challenge.  This is a  supervised test to see how your body responds to small amounts of foods that could be causing your symptoms. TREATMENT The main treatment for a seafood allergy is to avoid the foods you are allergic to. Treatment can also include:  Taking medicine (antihistamines or corticosteroids) to ease symptoms.  Having an emergency treatment plan that includes injecting epinephrine if anaphylaxis occurs.  Your health care provider will give you an emergency plan and teach you how to use the epinephrine injector. HOME CARE INSTRUCTIONS  Do not eat the shellfish or fish that you are allergic to.  Read food labels carefully. Fish and shellfish can be ingredients in sauces, broths, and other products.  When you eat out, let your server know that you have a food allergy. Ask how foods are prepared.  Take medicines only as directed by your health care provider.  Make sure you understand your emergency treatment plan.  Make sure you know how to use the epinephrine injector.  Always carry two doses with you in case of an anaphylactic emergency. Make sure they have not expired. SEEK MEDICAL CARE IF: You continue to have symptoms after treatment. SEEK IMMEDIATE MEDICAL CARE IF:  You have trouble breathing.  You accidentally ate some fish or shellfish that you are allergic to.  You used your epinephrine injector. You must seek emergency care as soon as possible after using the injector. MAKE SURE  YOU:  Understand these instructions.  Will watch your condition.  Will get help right away if you are not doing well or get worse.   This information is not intended to replace advice given to you by your health care provider. Make sure you discuss any questions you have with your health care provider.   Document Released: 01/12/2002 Document Revised: 08/13/2014 Document Reviewed: 11/06/2013 Elsevier Interactive Patient Education [employer].

## 2015-05-18 DIAGNOSIS — N08 Glomerular disorders in diseases classified elsewhere: Secondary | ICD-10-CM | POA: Diagnosis not present

## 2015-05-18 DIAGNOSIS — L509 Urticaria, unspecified: Secondary | ICD-10-CM | POA: Diagnosis not present

## 2015-05-18 DIAGNOSIS — N182 Chronic kidney disease, stage 2 (mild): Secondary | ICD-10-CM | POA: Diagnosis not present

## 2015-05-18 DIAGNOSIS — E1122 Type 2 diabetes mellitus with diabetic chronic kidney disease: Secondary | ICD-10-CM | POA: Diagnosis not present

## 2015-05-20 DIAGNOSIS — L309 Dermatitis, unspecified: Secondary | ICD-10-CM | POA: Diagnosis not present

## 2015-07-04 DIAGNOSIS — L308 Other specified dermatitis: Secondary | ICD-10-CM | POA: Diagnosis not present

## 2015-07-04 DIAGNOSIS — L239 Allergic contact dermatitis, unspecified cause: Secondary | ICD-10-CM | POA: Diagnosis not present

## 2015-08-03 ENCOUNTER — Ambulatory Visit (INDEPENDENT_AMBULATORY_CARE_PROVIDER_SITE_OTHER): Payer: Medicare Other | Admitting: Emergency Medicine

## 2015-08-03 VITALS — BP 124/72 | HR 85 | Temp 97.9°F | Resp 18 | Ht 61.0 in | Wt 151.0 lb

## 2015-08-03 DIAGNOSIS — L308 Other specified dermatitis: Secondary | ICD-10-CM | POA: Diagnosis not present

## 2015-08-03 MED ORDER — PREDNISONE 20 MG PO TABS: 40.0000 mg | ORAL_TABLET | Freq: Every day | ORAL | Status: DC

## 2015-08-03 NOTE — Patient Instructions (Signed)
Prednisone Prednisone tablets What is this medicine? PREDNISONE (PRED ni sone) is a corticosteroid. It is commonly used to treat inflammation of the skin, joints, lungs, and other organs. Common conditions treated include asthma, allergies, and arthritis. It is also used for other conditions, such as blood disorders and diseases of the adrenal glands. This medicine may be used for other purposes; ask your health care provider or pharmacist if you have questions. What should I tell my health care provider before I take this medicine? They need to know if you have any of these conditions: -Cushing's syndrome -diabetes -glaucoma -heart disease -high blood pressure -infection (especially a virus infection such as chickenpox, cold sores, or herpes) -kidney disease -liver disease -mental illness -myasthenia gravis -osteoporosis -seizures -stomach or intestine problems -thyroid disease -an unusual or allergic reaction to lactose, prednisone, other medicines, foods, dyes, or preservatives -pregnant or trying to get pregnant -breast-feeding How should I use this medicine? Take this medicine by mouth with a glass of water. Follow the directions on the prescription label. Take this medicine with food. If you are taking this medicine once a day, take it in the morning. Do not take more medicine than you are told to take. Do not suddenly stop taking your medicine because you may develop a severe reaction. Your doctor will tell you how much medicine to take. If your doctor wants you to stop the medicine, the dose may be slowly lowered over time to avoid any side effects. Talk to your pediatrician regarding the use of this medicine in children. Special care may be needed. Overdosage: If you think you have taken too much of this medicine contact a poison control center or emergency room at once. NOTE: This medicine is only for you. Do not share this medicine with others. What if I miss a dose? If you  miss a dose, take it as soon as you can. If it is almost time for your next dose, talk to your doctor or health care professional. You may need to miss a dose or take an extra dose. Do not take double or extra doses without advice. What may interact with this medicine? Do not take this medicine with any of the following medications: -metyrapone -mifepristone This medicine may also interact with the following medications: -aminoglutethimide -amphotericin B -aspirin and aspirin-like medicines -barbiturates -certain medicines for diabetes, like glipizide or glyburide -cholestyramine -cholinesterase inhibitors -cyclosporine -digoxin -diuretics -ephedrine -female hormones, like estrogens and birth control pills -isoniazid -ketoconazole -NSAIDS, medicines for pain and inflammation, like ibuprofen or naproxen -phenytoin -rifampin -toxoids -vaccines -warfarin This list may not describe all possible interactions. Give your health care provider a list of all the medicines, herbs, non-prescription drugs, or dietary supplements you use. Also tell them if you smoke, drink alcohol, or use illegal drugs. Some items may interact with your medicine. What should I watch for while using this medicine? Visit your doctor or health care professional for regular checks on your progress. If you are taking this medicine over a prolonged period, carry an identification card with your name and address, the type and dose of your medicine, and your doctor's name and address. This medicine may increase your risk of getting an infection. Tell your doctor or health care professional if you are around anyone with measles or chickenpox, or if you develop sores or blisters that do not heal properly. If you are going to have surgery, tell your doctor or health care professional that you have taken this medicine within the  last twelve months. Ask your doctor or health care professional about your diet. You may need to  lower the amount of salt you eat. This medicine may affect blood sugar levels. If you have diabetes, check with your doctor or health care professional before you change your diet or the dose of your diabetic medicine. What side effects may I notice from receiving this medicine? Side effects that you should report to your doctor or health care professional as soon as possible: -allergic reactions like skin rash, itching or hives, swelling of the face, lips, or tongue -changes in emotions or moods -changes in vision -depressed mood -eye pain -fever or chills, cough, sore throat, pain or difficulty passing urine -increased thirst -swelling of ankles, feet Side effects that usually do not require medical attention (report to your doctor or health care professional if they continue or are bothersome): -confusion, excitement, restlessness -headache -nausea, vomiting -skin problems, acne, thin and shiny skin -trouble sleeping -weight gain This list may not describe all possible side effects. Call your doctor for medical advice about side effects. You may report side effects to FDA at 1-800-FDA-1088. Where should I keep my medicine? Keep out of the reach of children. Store at room temperature between 15 and 30 degrees C (59 and 86 degrees F). Protect from light. Keep container tightly closed. Throw away any unused medicine after the expiration date. NOTE: This sheet is a summary. It may not cover all possible information. If you have questions about this medicine, talk to your doctor, pharmacist, or health care provider.    2016, Elsevier/[employer]. (2011-03-08 10:57:14)

## 2015-08-03 NOTE — Progress Notes (Signed)
Subjective:  Patient ID: Ashley Lyons, female    DOB: [birth year]  Age: 74 y.o. MRN: [id number]  CC: Follow-up   HPI Ashley Lyons presents   Patient has a rash. She's been seen by her family doctor and then again by a dermatologist who did a biopsy several months ago. She was treated with oral prednisone with improvement of symptoms but she says symptoms came back as soon as she stopped the medication. She was concerned about taking prednisone because it elevator sugar and she is controlling her diabetes with diet. Her biopsy was significant for superficial perivascular dermatitis. It did not respond to topical agents. Now she's come to the office requesting treatment but does not want prednisone.  History Ashley Lyons has a past medical history of Leukopenia; Allergy; Glaucoma; Diabetes mellitus; GERD (gastroesophageal reflux disease); and Hyperlipidemia.   She has past surgical history that includes Colonoscopy; Vaginal hysterectomy; Appendectomy; Tonsillectomy; and Tubal ligation.   Her  family history includes Ashley Lyons. There is no history of Colon cancer, Esophageal cancer, Rectal cancer, or Stomach cancer.  She   reports that she has never smoked. She has never used smokeless tobacco. She reports that she does not drink alcohol or use illicit drugs.  Outpatient Prescriptions Prior to Visit  Medication Sig Dispense Refill  . DiphenhydrAMINE HCl (BENADRYL ALLERGY PO) Take 25 mg by mouth as needed.    . Loratadine 10 MG CAPS Take by mouth daily.    . NON FORMULARY Apply 1 drop to eye daily. Timolol  1 drop Each eye daily    . rosuvastatin (CRESTOR) 5 MG tablet Take 5 mg by mouth as directed. M W F     . clobetasol ointment (TEMOVATE) [id number] % Apply 1 application topically 2 (two) times daily. Apply bid to rash till gone  Avoid face, genitals. (Patient not taking: Reported on 08/03/2015) 60 g 3  . Fluocinolone Acetonide (DERMA-SMOOTHE/FS BODY) 0.01 % OIL  Apply [id number] application topically 3 (three) times daily. (Patient not taking: Reported on 08/03/2015) 118.28 Bottle 1   No facility-administered medications prior to visit.    Social History   Social History  . Marital Status: Married    Spouse Name: N/A  . Number of Children: N/A  . Years of Education: N/A   Social History Main Topics  . Smoking status: Never Smoker   . Smokeless tobacco: Never Used  . Alcohol Use: No  . Drug Use: No  . Sexual Activity: Not Asked   Other Topics Concern  . None   Social History Narrative     Review of Systems  Constitutional: Negative for fever, chills and appetite change.  HENT: Negative for congestion, ear pain, postnasal drip, sinus pressure and sore throat.   Eyes: Negative for pain and redness.  Respiratory: Negative for cough, shortness of breath and wheezing.   Cardiovascular: Negative for leg swelling.  Gastrointestinal: Negative for nausea, vomiting, abdominal pain, diarrhea, constipation and blood in stool.  Endocrine: Negative for polyuria.  Genitourinary: Negative for dysuria, urgency, frequency and flank pain.  Musculoskeletal: Negative for gait problem.  Skin: Positive for rash.  Neurological: Negative for weakness and headaches.  Psychiatric/Behavioral: Negative for confusion and decreased concentration. The patient is not nervous/anxious.     Objective:  BP 124/72 mmHg  Pulse 85  Temp(Src) 97.9 F (36.6 C) (Oral)  Resp 18  Ht 5\' 1"  (1.549 m)  Wt 151 lb (68.493 kg)  BMI 28.55 kg/m2  SpO2  98%  Physical Exam  Constitutional: She is oriented to person, place, and time. She appears well-developed and well-nourished.  HENT:  Head: Normocephalic and atraumatic.  Eyes: Conjunctivae are normal. Pupils are equal, round, and reactive to light.  Pulmonary/Chest: Effort normal.  Musculoskeletal: She exhibits no edema.  Neurological: She is alert and oriented to person, place, and time.  Skin: Skin is dry. Rash (She has a  widely disseminated coalescent rash involving her neck and trunk and scattered on her arms and proximal legs.) noted.  Psychiatric: She has a normal mood and affect. Her behavior is normal. Thought content normal.      Assessment & Plan:   Ashley Lyons was seen today for follow-up.  Diagnoses and all orders for this visit:  Superficial perivascular dermatitis  Other orders -     predniSONE (DELTASONE) 20 MG tablet; Take 2 tablets (40 mg total) by mouth daily with breakfast.  I am having Ashley Lyons start on predniSONE. I am also having her maintain her rosuvastatin, Loratadine, DiphenhydrAMINE HCl (BENADRYL ALLERGY PO), NON FORMULARY, DERMA-SMOOTHE/FS BODY, and clobetasol ointment.  Meds ordered this encounter  Medications  . predniSONE (DELTASONE) 20 MG tablet    Sig: Take 2 tablets (40 mg total) by mouth daily with breakfast.    Dispense:  60 tablet    Refill:  0    I encouraged her to follow-up with her dermatologist is treating her in to the biopsy. The upper back on her prednisone with instructions absolutely cannot run out of her prednisone to either be seen again here or follow-up with her dermatologist prior to discharge medicines  Appropriate red flag conditions were discussed with the patient as well as actions that should be taken.  Patient expressed his understanding.  Follow-up: Return if symptoms worsen or fail to improve.  Ashley Culver, MD

## 2015-08-05 DIAGNOSIS — L239 Allergic contact dermatitis, unspecified cause: Secondary | ICD-10-CM | POA: Diagnosis not present

## 2015-08-15 DIAGNOSIS — N08 Glomerular disorders in diseases classified elsewhere: Secondary | ICD-10-CM | POA: Diagnosis not present

## 2015-08-15 DIAGNOSIS — E1122 Type 2 diabetes mellitus with diabetic chronic kidney disease: Secondary | ICD-10-CM | POA: Diagnosis not present

## 2015-08-15 DIAGNOSIS — N182 Chronic kidney disease, stage 2 (mild): Secondary | ICD-10-CM | POA: Diagnosis not present

## 2015-08-15 DIAGNOSIS — L309 Dermatitis, unspecified: Secondary | ICD-10-CM | POA: Diagnosis not present

## 2015-09-06 DIAGNOSIS — B353 Tinea pedis: Secondary | ICD-10-CM | POA: Diagnosis not present

## 2015-09-06 DIAGNOSIS — E1151 Type 2 diabetes mellitus with diabetic peripheral angiopathy without gangrene: Secondary | ICD-10-CM | POA: Diagnosis not present

## 2015-09-06 DIAGNOSIS — M216X1 Other acquired deformities of right foot: Secondary | ICD-10-CM | POA: Diagnosis not present

## 2015-09-06 DIAGNOSIS — M216X2 Other acquired deformities of left foot: Secondary | ICD-10-CM | POA: Diagnosis not present

## 2015-09-06 DIAGNOSIS — L602 Onychogryphosis: Secondary | ICD-10-CM | POA: Diagnosis not present

## 2015-09-27 DIAGNOSIS — L853 Xerosis cutis: Secondary | ICD-10-CM | POA: Diagnosis not present

## 2015-10-11 DIAGNOSIS — H43813 Vitreous degeneration, bilateral: Secondary | ICD-10-CM | POA: Diagnosis not present

## 2015-10-11 DIAGNOSIS — H25013 Cortical age-related cataract, bilateral: Secondary | ICD-10-CM | POA: Diagnosis not present

## 2015-10-11 DIAGNOSIS — E119 Type 2 diabetes mellitus without complications: Secondary | ICD-10-CM | POA: Diagnosis not present

## 2015-10-11 DIAGNOSIS — H401131 Primary open-angle glaucoma, bilateral, mild stage: Secondary | ICD-10-CM | POA: Diagnosis not present

## 2015-11-01 DIAGNOSIS — H25013 Cortical age-related cataract, bilateral: Secondary | ICD-10-CM | POA: Diagnosis not present

## 2015-11-01 DIAGNOSIS — H401131 Primary open-angle glaucoma, bilateral, mild stage: Secondary | ICD-10-CM | POA: Diagnosis not present

## 2015-11-01 DIAGNOSIS — H43813 Vitreous degeneration, bilateral: Secondary | ICD-10-CM | POA: Diagnosis not present

## 2015-11-01 DIAGNOSIS — E119 Type 2 diabetes mellitus without complications: Secondary | ICD-10-CM | POA: Diagnosis not present

## 2015-11-07 DIAGNOSIS — H409 Unspecified glaucoma: Secondary | ICD-10-CM | POA: Diagnosis not present

## 2015-11-07 DIAGNOSIS — H2513 Age-related nuclear cataract, bilateral: Secondary | ICD-10-CM | POA: Diagnosis not present

## 2015-11-07 DIAGNOSIS — H25811 Combined forms of age-related cataract, right eye: Secondary | ICD-10-CM | POA: Diagnosis not present

## 2015-11-07 DIAGNOSIS — H401131 Primary open-angle glaucoma, bilateral, mild stage: Secondary | ICD-10-CM | POA: Diagnosis not present

## 2015-11-07 DIAGNOSIS — H401111 Primary open-angle glaucoma, right eye, mild stage: Secondary | ICD-10-CM | POA: Diagnosis not present

## 2015-11-07 DIAGNOSIS — H2511 Age-related nuclear cataract, right eye: Secondary | ICD-10-CM | POA: Diagnosis not present

## 2015-11-21 DIAGNOSIS — L03115 Cellulitis of right lower limb: Secondary | ICD-10-CM | POA: Diagnosis not present

## 2015-12-07 DIAGNOSIS — N08 Glomerular disorders in diseases classified elsewhere: Secondary | ICD-10-CM | POA: Diagnosis not present

## 2015-12-07 DIAGNOSIS — R030 Elevated blood-pressure reading, without diagnosis of hypertension: Secondary | ICD-10-CM | POA: Diagnosis not present

## 2015-12-07 DIAGNOSIS — E1122 Type 2 diabetes mellitus with diabetic chronic kidney disease: Secondary | ICD-10-CM | POA: Diagnosis not present

## 2015-12-07 DIAGNOSIS — N182 Chronic kidney disease, stage 2 (mild): Secondary | ICD-10-CM | POA: Diagnosis not present

## 2015-12-14 DIAGNOSIS — H10012 Acute follicular conjunctivitis, left eye: Secondary | ICD-10-CM | POA: Diagnosis not present

## 2015-12-21 DIAGNOSIS — H10012 Acute follicular conjunctivitis, left eye: Secondary | ICD-10-CM | POA: Diagnosis not present

## 2015-12-21 DIAGNOSIS — H2512 Age-related nuclear cataract, left eye: Secondary | ICD-10-CM | POA: Diagnosis not present

## 2015-12-21 DIAGNOSIS — H00012 Hordeolum externum right lower eyelid: Secondary | ICD-10-CM | POA: Diagnosis not present

## 2015-12-21 DIAGNOSIS — H401134 Primary open-angle glaucoma, bilateral, indeterminate stage: Secondary | ICD-10-CM | POA: Diagnosis not present

## 2015-12-22 DIAGNOSIS — L2084 Intrinsic (allergic) eczema: Secondary | ICD-10-CM | POA: Diagnosis not present

## 2015-12-22 DIAGNOSIS — L299 Pruritus, unspecified: Secondary | ICD-10-CM | POA: Diagnosis not present

## 2016-01-10 DIAGNOSIS — H10012 Acute follicular conjunctivitis, left eye: Secondary | ICD-10-CM | POA: Diagnosis not present

## 2016-01-10 DIAGNOSIS — H2512 Age-related nuclear cataract, left eye: Secondary | ICD-10-CM | POA: Diagnosis not present

## 2016-01-10 DIAGNOSIS — H401134 Primary open-angle glaucoma, bilateral, indeterminate stage: Secondary | ICD-10-CM | POA: Diagnosis not present

## 2016-01-10 DIAGNOSIS — H00012 Hordeolum externum right lower eyelid: Secondary | ICD-10-CM | POA: Diagnosis not present

## 2016-01-10 DIAGNOSIS — H0012 Chalazion right lower eyelid: Secondary | ICD-10-CM | POA: Diagnosis not present

## 2016-01-24 DIAGNOSIS — Z961 Presence of intraocular lens: Secondary | ICD-10-CM | POA: Diagnosis not present

## 2016-02-14 ENCOUNTER — Ambulatory Visit
Admission: RE | Admit: 2016-02-14 | Discharge: 2016-02-14 | Disposition: A | Discharge status: Home / Self Care | Payer: Medicare Other | Source: Ambulatory Visit | Attending: Internal Medicine | Admitting: Internal Medicine

## 2016-02-14 ENCOUNTER — Other Ambulatory Visit: Payer: Self-pay | Admitting: Internal Medicine

## 2016-02-14 DIAGNOSIS — Z1231 Encounter for screening mammogram for malignant neoplasm of breast: Secondary | ICD-10-CM

## 2016-02-14 DIAGNOSIS — R001 Bradycardia, unspecified: Secondary | ICD-10-CM | POA: Diagnosis not present

## 2016-02-14 DIAGNOSIS — E1122 Type 2 diabetes mellitus with diabetic chronic kidney disease: Secondary | ICD-10-CM | POA: Diagnosis not present

## 2016-02-14 DIAGNOSIS — E559 Vitamin D deficiency, unspecified: Secondary | ICD-10-CM | POA: Diagnosis not present

## 2016-02-14 DIAGNOSIS — N08 Glomerular disorders in diseases classified elsewhere: Secondary | ICD-10-CM | POA: Diagnosis not present

## 2016-02-14 DIAGNOSIS — N182 Chronic kidney disease, stage 2 (mild): Secondary | ICD-10-CM | POA: Diagnosis not present

## 2016-02-14 DIAGNOSIS — Z0000 Encounter for general adult medical examination without abnormal findings: Secondary | ICD-10-CM | POA: Diagnosis not present

## 2016-04-12 DIAGNOSIS — L2084 Intrinsic (allergic) eczema: Secondary | ICD-10-CM | POA: Diagnosis not present

## 2016-04-12 DIAGNOSIS — L299 Pruritus, unspecified: Secondary | ICD-10-CM | POA: Diagnosis not present

## 2016-04-12 DIAGNOSIS — L409 Psoriasis, unspecified: Secondary | ICD-10-CM | POA: Diagnosis not present

## 2016-04-25 DIAGNOSIS — H00012 Hordeolum externum right lower eyelid: Secondary | ICD-10-CM | POA: Diagnosis not present

## 2016-04-25 DIAGNOSIS — H401134 Primary open-angle glaucoma, bilateral, indeterminate stage: Secondary | ICD-10-CM | POA: Diagnosis not present

## 2016-04-25 DIAGNOSIS — H10012 Acute follicular conjunctivitis, left eye: Secondary | ICD-10-CM | POA: Diagnosis not present

## 2016-04-25 DIAGNOSIS — H2512 Age-related nuclear cataract, left eye: Secondary | ICD-10-CM | POA: Diagnosis not present

## 2016-04-25 DIAGNOSIS — H0012 Chalazion right lower eyelid: Secondary | ICD-10-CM | POA: Diagnosis not present

## 2016-06-06 DIAGNOSIS — H26491 Other secondary cataract, right eye: Secondary | ICD-10-CM | POA: Diagnosis not present

## 2016-06-06 DIAGNOSIS — H2512 Age-related nuclear cataract, left eye: Secondary | ICD-10-CM | POA: Diagnosis not present

## 2016-06-06 DIAGNOSIS — H401134 Primary open-angle glaucoma, bilateral, indeterminate stage: Secondary | ICD-10-CM | POA: Diagnosis not present

## 2016-06-18 DIAGNOSIS — H2512 Age-related nuclear cataract, left eye: Secondary | ICD-10-CM | POA: Diagnosis not present

## 2016-06-21 DIAGNOSIS — N08 Glomerular disorders in diseases classified elsewhere: Secondary | ICD-10-CM | POA: Diagnosis not present

## 2016-06-21 DIAGNOSIS — M859 Disorder of bone density and structure, unspecified: Secondary | ICD-10-CM | POA: Diagnosis not present

## 2016-06-21 DIAGNOSIS — M8580 Other specified disorders of bone density and structure, unspecified site: Secondary | ICD-10-CM | POA: Diagnosis not present

## 2016-06-21 DIAGNOSIS — N182 Chronic kidney disease, stage 2 (mild): Secondary | ICD-10-CM | POA: Diagnosis not present

## 2016-06-21 DIAGNOSIS — E1122 Type 2 diabetes mellitus with diabetic chronic kidney disease: Secondary | ICD-10-CM | POA: Diagnosis not present

## 2016-06-21 DIAGNOSIS — N181 Chronic kidney disease, stage 1: Secondary | ICD-10-CM | POA: Diagnosis not present

## 2016-07-09 DIAGNOSIS — H25812 Combined forms of age-related cataract, left eye: Secondary | ICD-10-CM | POA: Diagnosis not present

## 2016-07-09 DIAGNOSIS — H2512 Age-related nuclear cataract, left eye: Secondary | ICD-10-CM | POA: Diagnosis not present

## 2016-08-24 DIAGNOSIS — Z961 Presence of intraocular lens: Secondary | ICD-10-CM | POA: Diagnosis not present

## 2016-09-06 DIAGNOSIS — L2084 Intrinsic (allergic) eczema: Secondary | ICD-10-CM | POA: Diagnosis not present

## 2016-09-06 DIAGNOSIS — L299 Pruritus, unspecified: Secondary | ICD-10-CM | POA: Diagnosis not present

## 2016-10-22 DIAGNOSIS — N182 Chronic kidney disease, stage 2 (mild): Secondary | ICD-10-CM | POA: Diagnosis not present

## 2016-10-22 DIAGNOSIS — N08 Glomerular disorders in diseases classified elsewhere: Secondary | ICD-10-CM | POA: Diagnosis not present

## 2016-10-22 DIAGNOSIS — M549 Dorsalgia, unspecified: Secondary | ICD-10-CM | POA: Diagnosis not present

## 2016-10-22 DIAGNOSIS — E1122 Type 2 diabetes mellitus with diabetic chronic kidney disease: Secondary | ICD-10-CM | POA: Diagnosis not present

## 2016-11-13 DIAGNOSIS — H26492 Other secondary cataract, left eye: Secondary | ICD-10-CM | POA: Diagnosis not present

## 2016-11-13 DIAGNOSIS — H0012 Chalazion right lower eyelid: Secondary | ICD-10-CM | POA: Diagnosis not present

## 2016-11-13 DIAGNOSIS — H401134 Primary open-angle glaucoma, bilateral, indeterminate stage: Secondary | ICD-10-CM | POA: Diagnosis not present

## 2016-12-06 DIAGNOSIS — H26492 Other secondary cataract, left eye: Secondary | ICD-10-CM | POA: Diagnosis not present

## 2016-12-06 DIAGNOSIS — H0012 Chalazion right lower eyelid: Secondary | ICD-10-CM | POA: Diagnosis not present

## 2016-12-06 DIAGNOSIS — H401134 Primary open-angle glaucoma, bilateral, indeterminate stage: Secondary | ICD-10-CM | POA: Diagnosis not present

## 2017-02-13 DIAGNOSIS — H04123 Dry eye syndrome of bilateral lacrimal glands: Secondary | ICD-10-CM | POA: Diagnosis not present

## 2017-02-13 DIAGNOSIS — H26492 Other secondary cataract, left eye: Secondary | ICD-10-CM | POA: Diagnosis not present

## 2017-03-26 DIAGNOSIS — E1122 Type 2 diabetes mellitus with diabetic chronic kidney disease: Secondary | ICD-10-CM | POA: Diagnosis not present

## 2017-03-26 DIAGNOSIS — E559 Vitamin D deficiency, unspecified: Secondary | ICD-10-CM | POA: Diagnosis not present

## 2017-03-26 DIAGNOSIS — Z0000 Encounter for general adult medical examination without abnormal findings: Secondary | ICD-10-CM | POA: Diagnosis not present

## 2017-03-26 DIAGNOSIS — D531 Other megaloblastic anemias, not elsewhere classified: Secondary | ICD-10-CM | POA: Diagnosis not present

## 2017-03-26 DIAGNOSIS — N182 Chronic kidney disease, stage 2 (mild): Secondary | ICD-10-CM | POA: Diagnosis not present

## 2017-03-26 DIAGNOSIS — N08 Glomerular disorders in diseases classified elsewhere: Secondary | ICD-10-CM | POA: Diagnosis not present

## 2017-06-06 DIAGNOSIS — L2084 Intrinsic (allergic) eczema: Secondary | ICD-10-CM | POA: Diagnosis not present

## 2017-06-25 DIAGNOSIS — H33322 Round hole, left eye: Secondary | ICD-10-CM | POA: Diagnosis not present

## 2017-06-25 DIAGNOSIS — H26492 Other secondary cataract, left eye: Secondary | ICD-10-CM | POA: Diagnosis not present

## 2017-06-25 DIAGNOSIS — H04123 Dry eye syndrome of bilateral lacrimal glands: Secondary | ICD-10-CM | POA: Diagnosis not present

## 2017-06-25 DIAGNOSIS — H401134 Primary open-angle glaucoma, bilateral, indeterminate stage: Secondary | ICD-10-CM | POA: Diagnosis not present

## 2017-07-22 DIAGNOSIS — R030 Elevated blood-pressure reading, without diagnosis of hypertension: Secondary | ICD-10-CM | POA: Diagnosis not present

## 2017-07-22 DIAGNOSIS — N08 Glomerular disorders in diseases classified elsewhere: Secondary | ICD-10-CM | POA: Diagnosis not present

## 2017-07-22 DIAGNOSIS — E1122 Type 2 diabetes mellitus with diabetic chronic kidney disease: Secondary | ICD-10-CM | POA: Diagnosis not present

## 2017-07-22 DIAGNOSIS — N182 Chronic kidney disease, stage 2 (mild): Secondary | ICD-10-CM | POA: Diagnosis not present

## 2017-09-25 DIAGNOSIS — H04123 Dry eye syndrome of bilateral lacrimal glands: Secondary | ICD-10-CM | POA: Diagnosis not present

## 2017-09-25 DIAGNOSIS — H26492 Other secondary cataract, left eye: Secondary | ICD-10-CM | POA: Diagnosis not present

## 2017-09-25 DIAGNOSIS — H33322 Round hole, left eye: Secondary | ICD-10-CM | POA: Diagnosis not present

## 2017-09-25 DIAGNOSIS — H43812 Vitreous degeneration, left eye: Secondary | ICD-10-CM | POA: Diagnosis not present

## 2017-10-11 DIAGNOSIS — H35412 Lattice degeneration of retina, left eye: Secondary | ICD-10-CM | POA: Diagnosis not present

## 2017-10-11 DIAGNOSIS — H43813 Vitreous degeneration, bilateral: Secondary | ICD-10-CM | POA: Diagnosis not present

## 2017-10-11 DIAGNOSIS — H33322 Round hole, left eye: Secondary | ICD-10-CM | POA: Diagnosis not present

## 2017-10-11 DIAGNOSIS — H33312 Horseshoe tear of retina without detachment, left eye: Secondary | ICD-10-CM | POA: Diagnosis not present

## 2017-11-01 DIAGNOSIS — H33312 Horseshoe tear of retina without detachment, left eye: Secondary | ICD-10-CM | POA: Diagnosis not present

## 2017-11-25 DIAGNOSIS — E1122 Type 2 diabetes mellitus with diabetic chronic kidney disease: Secondary | ICD-10-CM | POA: Diagnosis not present

## 2017-11-25 DIAGNOSIS — N08 Glomerular disorders in diseases classified elsewhere: Secondary | ICD-10-CM | POA: Diagnosis not present

## 2017-11-25 DIAGNOSIS — E785 Hyperlipidemia, unspecified: Secondary | ICD-10-CM | POA: Diagnosis not present

## 2017-11-25 DIAGNOSIS — N182 Chronic kidney disease, stage 2 (mild): Secondary | ICD-10-CM | POA: Diagnosis not present

## 2018-01-28 DIAGNOSIS — Z1389 Encounter for screening for other disorder: Secondary | ICD-10-CM | POA: Diagnosis not present

## 2018-01-28 DIAGNOSIS — E785 Hyperlipidemia, unspecified: Secondary | ICD-10-CM | POA: Diagnosis not present

## 2018-01-28 DIAGNOSIS — Z79899 Other long term (current) drug therapy: Secondary | ICD-10-CM | POA: Diagnosis not present

## 2018-03-18 ENCOUNTER — Other Ambulatory Visit: Payer: Self-pay

## 2018-03-18 NOTE — Patient Outreach (Signed)
[place] [facility]) Care Management  03/18/2018  AMELY VOORHEIS 1941-01-17 [id number]   Medication Adherence call to Mrs. Luvenia Redden Left a message for patient to call back patient is due on Pravastatin 40 mg. Mrs. Kwan is showing past due under [place].   [place] Management Direct Dial [phone]  Fax [phone] [email or web address]

## 2018-03-25 DIAGNOSIS — H401122 Primary open-angle glaucoma, left eye, moderate stage: Secondary | ICD-10-CM | POA: Diagnosis not present

## 2018-03-25 DIAGNOSIS — H401111 Primary open-angle glaucoma, right eye, mild stage: Secondary | ICD-10-CM | POA: Diagnosis not present

## 2018-04-03 DIAGNOSIS — D649 Anemia, unspecified: Secondary | ICD-10-CM | POA: Diagnosis not present

## 2018-04-03 DIAGNOSIS — R8291 Other chromoabnormalities of urine: Secondary | ICD-10-CM | POA: Diagnosis not present

## 2018-04-03 DIAGNOSIS — Z1212 Encounter for screening for malignant neoplasm of rectum: Secondary | ICD-10-CM | POA: Diagnosis not present

## 2018-04-03 DIAGNOSIS — Z1231 Encounter for screening mammogram for malignant neoplasm of breast: Secondary | ICD-10-CM | POA: Diagnosis not present

## 2018-04-03 DIAGNOSIS — N182 Chronic kidney disease, stage 2 (mild): Secondary | ICD-10-CM | POA: Diagnosis not present

## 2018-04-03 DIAGNOSIS — E1122 Type 2 diabetes mellitus with diabetic chronic kidney disease: Secondary | ICD-10-CM | POA: Diagnosis not present

## 2018-04-03 DIAGNOSIS — Z0000 Encounter for general adult medical examination without abnormal findings: Secondary | ICD-10-CM | POA: Diagnosis not present

## 2018-04-03 DIAGNOSIS — Z79899 Other long term (current) drug therapy: Secondary | ICD-10-CM | POA: Diagnosis not present

## 2018-04-03 DIAGNOSIS — E559 Vitamin D deficiency, unspecified: Secondary | ICD-10-CM | POA: Diagnosis not present

## 2018-04-03 DIAGNOSIS — Z1289 Encounter for screening for malignant neoplasm of other sites: Secondary | ICD-10-CM | POA: Diagnosis not present

## 2018-04-03 DIAGNOSIS — E7800 Pure hypercholesterolemia, unspecified: Secondary | ICD-10-CM | POA: Diagnosis not present

## 2018-04-03 LAB — BASIC METABOLIC PANEL
BUN: 11 (ref 4–21)
Creatinine: 0.9 (ref 0.5–1.1)
GLUCOSE: 101
POTASSIUM: 4.5 (ref 3.4–5.3)
SODIUM: 143 (ref 137–147)

## 2018-04-03 LAB — HEPATIC FUNCTION PANEL
ALT: 12 (ref 7–35)
AST: 22 (ref 13–35)
BILIRUBIN, TOTAL: 0.5

## 2018-04-03 LAB — LIPID PANEL
Cholesterol: 196 (ref 0–200)
HDL: 72 — AB (ref 35–70)
LDL Cholesterol: 110
LDL/HDL RATIO: 1.5
Triglycerides: 68 (ref 40–160)

## 2018-04-03 LAB — CBC AND DIFFERENTIAL
HEMATOCRIT: 39 (ref 36–46)
Hemoglobin: 13.3 (ref 12.0–16.0)
WBC: 2.4

## 2018-04-03 LAB — VITAMIN B12: VITAMIN B 12: 1047

## 2018-04-03 LAB — TSH: TSH: 1.75 (ref 0.41–5.90)

## 2018-04-03 LAB — HEMOGLOBIN A1C: HEMOGLOBIN A1C: 6.3

## 2018-04-03 LAB — VITAMIN D 25 HYDROXY (VIT D DEFICIENCY, FRACTURES): Vit D, 25-Hydroxy: 45.9

## 2018-04-17 ENCOUNTER — Other Ambulatory Visit: Payer: Self-pay | Admitting: Internal Medicine

## 2018-04-17 DIAGNOSIS — Z1231 Encounter for screening mammogram for malignant neoplasm of breast: Secondary | ICD-10-CM

## 2018-04-17 DIAGNOSIS — E2839 Other primary ovarian failure: Secondary | ICD-10-CM

## 2018-05-10 ENCOUNTER — Encounter: Payer: Self-pay | Admitting: Internal Medicine

## 2018-05-10 DIAGNOSIS — R8291 Other chromoabnormalities of urine: Secondary | ICD-10-CM | POA: Insufficient documentation

## 2018-05-10 DIAGNOSIS — E2839 Other primary ovarian failure: Secondary | ICD-10-CM

## 2018-05-10 DIAGNOSIS — R001 Bradycardia, unspecified: Secondary | ICD-10-CM | POA: Insufficient documentation

## 2018-05-10 DIAGNOSIS — R7303 Prediabetes: Secondary | ICD-10-CM | POA: Insufficient documentation

## 2018-05-10 DIAGNOSIS — E7800 Pure hypercholesterolemia, unspecified: Secondary | ICD-10-CM | POA: Insufficient documentation

## 2018-05-13 ENCOUNTER — Other Ambulatory Visit: Payer: Self-pay | Admitting: Nurse Practitioner

## 2018-06-12 ENCOUNTER — Other Ambulatory Visit: Payer: Medicare Other

## 2018-06-12 ENCOUNTER — Ambulatory Visit: Payer: Medicare Other

## 2018-06-23 ENCOUNTER — Ambulatory Visit
Admission: RE | Admit: 2018-06-23 | Discharge: 2018-06-23 | Disposition: A | Discharge status: Home / Self Care | Payer: Medicare Other | Source: Ambulatory Visit | Attending: Internal Medicine | Admitting: Internal Medicine

## 2018-06-23 DIAGNOSIS — Z780 Asymptomatic menopausal state: Secondary | ICD-10-CM | POA: Diagnosis not present

## 2018-06-23 DIAGNOSIS — Z1231 Encounter for screening mammogram for malignant neoplasm of breast: Secondary | ICD-10-CM

## 2018-06-23 DIAGNOSIS — M85851 Other specified disorders of bone density and structure, right thigh: Secondary | ICD-10-CM | POA: Diagnosis not present

## 2018-06-23 DIAGNOSIS — E2839 Other primary ovarian failure: Secondary | ICD-10-CM

## 2018-06-24 NOTE — Progress Notes (Signed)
Your bone density results are NORMAL. Please continue to engage in weight-bearing exercises three days weekly. If you are taking calcium and vitamin D supplements, please continue.   Happy holidays to you and your family!  Sincerely,    Ellesse Antenucci N. Baird Cancer, MD

## 2018-07-15 ENCOUNTER — Ambulatory Visit (INDEPENDENT_AMBULATORY_CARE_PROVIDER_SITE_OTHER): Payer: Medicare Other | Admitting: Internal Medicine

## 2018-07-15 ENCOUNTER — Encounter: Payer: Self-pay | Admitting: Internal Medicine

## 2018-07-15 VITALS — BP 112/78 | HR 50 | Temp 98.1°F | Ht 60.25 in | Wt 143.6 lb

## 2018-07-15 DIAGNOSIS — E1122 Type 2 diabetes mellitus with diabetic chronic kidney disease: Secondary | ICD-10-CM

## 2018-07-15 DIAGNOSIS — L2084 Intrinsic (allergic) eczema: Secondary | ICD-10-CM

## 2018-07-15 DIAGNOSIS — N182 Chronic kidney disease, stage 2 (mild): Secondary | ICD-10-CM

## 2018-07-15 DIAGNOSIS — E7800 Pure hypercholesterolemia, unspecified: Secondary | ICD-10-CM

## 2018-07-15 LAB — POCT URINALYSIS DIPSTICK
BILIRUBIN UA: NEGATIVE
GLUCOSE UA: NEGATIVE
Ketones, UA: NEGATIVE
Nitrite, UA: NEGATIVE
Protein, UA: NEGATIVE
RBC UA: NEGATIVE
Spec Grav, UA: 1.015 (ref 1.010–1.025)
Urobilinogen, UA: 0.2 E.U./dL
pH, UA: 5.5 (ref 5.0–8.0)

## 2018-07-15 LAB — BMP8+EGFR
BUN / CREAT RATIO: 15 (ref 12–28)
BUN: 12 mg/dL (ref 8–27)
CHLORIDE: 102 mmol/L (ref 96–106)
CO2: 23 mmol/L (ref 20–29)
CREATININE: 0.8 mg/dL (ref 0.57–1.00)
Calcium: 9.2 mg/dL (ref 8.7–10.3)
GFR calc non Af Amer: 70 mL/min/{1.73_m2} (ref >59)
GFR, EST AFRICAN AMERICAN: 81 mL/min/{1.73_m2} (ref >59)
GLUCOSE: 113 mg/dL — AB (ref 65–99)
Potassium: 4.3 mmol/L (ref 3.5–5.2)
Sodium: 141 mmol/L (ref 134–144)

## 2018-07-15 LAB — POCT UA - MICROALBUMIN
ALBUMIN/CREATININE RATIO, URINE, POC: 30
CREATININE, POC: 200 mg/dL
MICROALBUMIN (UR) POC: 10 mg/L

## 2018-07-15 LAB — HEMOGLOBIN A1C
Est. average glucose Bld gHb Est-mCnc: 134 mg/dL
Hgb A1c MFr Bld: 6.3 % — ABNORMAL HIGH (ref 4.8–5.6)

## 2018-07-15 MED ORDER — PRAVASTATIN SODIUM 40 MG PO TABS
ORAL_TABLET | ORAL | 2 refills | Status: DC
Start: 2018-07-15 — End: 2019-06-24

## 2018-07-15 NOTE — Patient Instructions (Signed)

## 2018-07-15 NOTE — Progress Notes (Signed)
Subjective:     Patient ID: Ashley Lyons , female    DOB: Sep 16, 1940 , 77 y.o.   MRN: [id number]   Chief Complaint  Patient presents with  . Diabetes    HPI  Diabetes  She presents for her follow-up diabetic visit. She has type 2 diabetes mellitus. Her disease course has been stable. There are no hypoglycemic associated symptoms. There are no diabetic associated symptoms. There are no hypoglycemic complications. Risk factors for coronary artery disease include diabetes mellitus, dyslipidemia and post-menopausal.     Past Medical History:  Diagnosis Date  . Allergy   . Diabetes mellitus   . GERD (gastroesophageal reflux disease)   . Glaucoma    right eye  . Hyperlipidemia   . Leukopenia      Family History  Problem Relation Age of Onset  . Prostate cancer Father   . Diabetes Mother   . Prostate cancer Brother   . Colon cancer Neg Hx   . Esophageal cancer Neg Hx   . Rectal cancer Neg Hx   . Stomach cancer Neg Hx      Current Outpatient Medications:  .  Barberry-Oreg Grape-Goldenseal (BERBERINE COMPLEX PO), Take 500 mg by mouth., Disp: , Rfl:  .  BIOTIN PO, Take by mouth., Disp: , Rfl:  .  CALCIUM PO, Take by mouth., Disp: , Rfl:  .  co-enzyme Q-10 30 MG capsule, Take 30 mg by mouth 3 (three) times daily., Disp: , Rfl:  .  DiphenhydrAMINE HCl (BENADRYL ALLERGY PO), Take 25 mg by mouth as needed., Disp: , Rfl:  .  LECITHIN PO, Take by mouth., Disp: , Rfl:  .  NON FORMULARY, Apply 1 drop to eye daily. Timolol  1 drop Each eye daily, Disp: , Rfl:  .  SPIRULINA PO, Take by mouth., Disp: , Rfl:  .  Timolol Maleate (ISTALOL) 0.5 % (DAILY) SOLN, Apply to eye., Disp: , Rfl:  .  pravastatin (PRAVACHOL) 40 MG tablet, TAKE 1 TABLET BY MOUTH ONCE DAILY MONDAY  THROUGH  FRIDAY (Patient not taking: Reported on 07/15/2018), Disp: 30 tablet, Rfl: 2   Allergies  Allergen Reactions  . Almond Oil Hives    Almonds  . Bean Pod Extract Hives    String Beans  . Corn-Containing  Products Hives  . Peanuts [Peanut Oil] Hives  . Sodium Laureth Sulfate Hives  . Soy Allergy Hives  . Strawberry Extract Hives     Review of Systems  Constitutional: Negative.   Respiratory: Negative.   Cardiovascular: Negative.   Neurological: Negative.   Psychiatric/Behavioral: Negative.      Today's Vitals   07/15/18 0904  BP: 112/78  Pulse: (!) 50  Temp: 98.1 F (36.7 C)  TempSrc: Oral  Weight: 143 lb 9.6 oz (65.1 kg)  Height: 5' 0.25" (1.53 m)  PainSc: 0-No pain   Body mass index is 27.81 kg/m.   Objective:  Physical Exam Vitals signs and nursing note reviewed.  Constitutional:      Appearance: Normal appearance. She is normal weight.  HENT:     Head: Normocephalic and atraumatic.  Neck:     Musculoskeletal: Normal range of motion and neck supple.  Cardiovascular:     Rate and Rhythm: Normal rate and regular rhythm.  Pulmonary:     Effort: Pulmonary effort is normal.     Breath sounds: Normal breath sounds.  Skin:    General: Skin is warm and dry.  Neurological:     General: No focal deficit  present.     Mental Status: She is alert and oriented to person, place, and time.  Psychiatric:        Mood and Affect: Mood normal.         Assessment And Plan:     1. Type 2 diabetes mellitus with stage 2 chronic kidney disease, without long-term current use of insulin ([place])  Importance of regular exercise was discussed with the patient. She will rto in four months for re-evaluation. She is encouraged to avoid sugary beverages.   - Hemoglobin A1c - BMP8+EGFR  2. Chronic renal disease, stage II  Chronic. She is encouraged to stay well hydrated.   3. Pure hypercholesterolemia  I will check lipid panel at her next visit. She will continue with current meds.   4. Intrinsic atopic dermatitis  Chronic. As per Dermatology. Thankfully, a lot of her symptoms have improved with dietary changes.   [name], MD

## 2018-07-17 NOTE — Progress Notes (Signed)
Here are your lab results:  Your hba1c is 6.3, this is great. Your kidney function are stable. Happy holidays to you and your family!  Sincerely,    Eleshia Wooley N. Baird Cancer, MD

## 2018-07-21 DIAGNOSIS — H401111 Primary open-angle glaucoma, right eye, mild stage: Secondary | ICD-10-CM | POA: Diagnosis not present

## 2018-07-22 DIAGNOSIS — H26492 Other secondary cataract, left eye: Secondary | ICD-10-CM | POA: Diagnosis not present

## 2018-07-22 DIAGNOSIS — H04123 Dry eye syndrome of bilateral lacrimal glands: Secondary | ICD-10-CM | POA: Diagnosis not present

## 2018-07-22 DIAGNOSIS — H401122 Primary open-angle glaucoma, left eye, moderate stage: Secondary | ICD-10-CM | POA: Diagnosis not present

## 2018-07-22 DIAGNOSIS — H401111 Primary open-angle glaucoma, right eye, mild stage: Secondary | ICD-10-CM | POA: Diagnosis not present

## 2018-08-12 ENCOUNTER — Other Ambulatory Visit: Payer: Self-pay | Admitting: Internal Medicine

## 2018-10-21 DIAGNOSIS — H43812 Vitreous degeneration, left eye: Secondary | ICD-10-CM | POA: Diagnosis not present

## 2018-10-21 DIAGNOSIS — H26492 Other secondary cataract, left eye: Secondary | ICD-10-CM | POA: Diagnosis not present

## 2018-10-21 DIAGNOSIS — H04123 Dry eye syndrome of bilateral lacrimal glands: Secondary | ICD-10-CM | POA: Diagnosis not present

## 2018-10-21 DIAGNOSIS — H401111 Primary open-angle glaucoma, right eye, mild stage: Secondary | ICD-10-CM | POA: Diagnosis not present

## 2018-10-21 DIAGNOSIS — H401122 Primary open-angle glaucoma, left eye, moderate stage: Secondary | ICD-10-CM | POA: Diagnosis not present

## 2018-10-21 LAB — HM DIABETES EYE EXAM

## 2018-10-30 ENCOUNTER — Other Ambulatory Visit: Payer: Self-pay

## 2018-10-30 NOTE — Patient Outreach (Signed)
[place] [facility]) Care Management  10/30/2018  KHLOEY CHERN May 14, 1941 [id number]   Medication Adherence call to Mrs. Luvenia Redden HIPPA Compliant Voice message left with a call back number. Mrs. Warrior is showing due on Pravastatin 40 mg under [place].    [place] Management Direct Dial [phone]  Fax [phone] [email or web address]

## 2018-11-12 ENCOUNTER — Other Ambulatory Visit: Payer: Self-pay

## 2018-11-12 ENCOUNTER — Ambulatory Visit (INDEPENDENT_AMBULATORY_CARE_PROVIDER_SITE_OTHER): Payer: Medicare Other | Admitting: Internal Medicine

## 2018-11-12 ENCOUNTER — Encounter: Payer: Self-pay | Admitting: Internal Medicine

## 2018-11-12 VITALS — BP 126/82 | HR 49 | Temp 98.3°F | Ht 60.25 in | Wt 141.2 lb

## 2018-11-12 DIAGNOSIS — R252 Cramp and spasm: Secondary | ICD-10-CM | POA: Diagnosis not present

## 2018-11-12 DIAGNOSIS — E7800 Pure hypercholesterolemia, unspecified: Secondary | ICD-10-CM

## 2018-11-12 DIAGNOSIS — E1122 Type 2 diabetes mellitus with diabetic chronic kidney disease: Secondary | ICD-10-CM | POA: Diagnosis not present

## 2018-11-12 DIAGNOSIS — N182 Chronic kidney disease, stage 2 (mild): Secondary | ICD-10-CM

## 2018-11-12 NOTE — Patient Instructions (Signed)
Muscle Cramps and Spasms Muscle cramps and spasms are when muscles tighten by themselves. They usually get better within minutes. Muscle cramps are painful. They are usually stronger and last longer than muscle spasms. Muscle spasms may or may not be painful. They can last a few seconds or much longer. Cramps and spasms can affect any muscle, but they occur most often in the calf muscles of the leg. They are usually not caused by a serious problem. In many cases, the cause is not known. Some common causes include:  Doing more physical work or exercise than your body is ready for.  Using the muscles too much (overuse) by repeating certain movements too many times.  Staying in a certain position for a long time.  Playing a sport or doing an activity without preparing properly.  Using bad form or technique while playing a sport or doing an activity.  Not having enough water in your body (dehydration).  Injury.  Side effects of some medicines.  Low levels of the salts and minerals in your blood (electrolytes), such as low potassium or calcium. Follow these instructions at home: Managing pain and stiffness      Massage, stretch, and relax the muscle. Do this for many minutes at a time.  If told, put heat on tight or tense muscles as often as told by your doctor. Use the heat source that your doctor recommends, such as a moist heat pack or a heating pad. ? Place a towel between your skin and the heat source. ? Leave the heat on for 20-30 minutes. ? Remove the heat if your skin turns bright red. This is very important if you are not able to feel pain, heat, or cold. You may have a greater risk of getting burned.  If told, put ice on the affected area. This may help if you are sore or have pain after a cramp or spasm. ? Put ice in a plastic bag. ? Place a towel between your skin and the bag. ? Leave the ice on for 20 minutes, 2-3 times a day.  Try taking hot showers or baths to help  relax tight muscles. Eating and drinking  Drink enough fluid to keep your pee (urine) pale yellow.  Eat a healthy diet to help ensure that your muscles work well. This should include: ? Fruits and vegetables. ? Lean protein. ? Whole grains. ? Low-fat or nonfat dairy products. General instructions  If you are having cramps often, avoid intense exercise for several days.  Take over-the-counter and prescription medicines only as told by your doctor.  Watch for any changes in your symptoms.  Keep all follow-up visits as told by your doctor. This is important. Contact a doctor if:  Your cramps or spasms get worse or happen more often.  Your cramps or spasms do not get better with time. Summary  Muscle cramps and spasms are when muscles tighten by themselves. They usually get better within minutes.  Cramps and spasms occur most often in the calf muscles of the leg.  Massage, stretch, and relax the muscle. This may help the cramp or spasm go away.  Drink enough fluid to keep your pee (urine) pale yellow. This information is not intended to replace advice given to you by your health care provider. Make sure you discuss any questions you have with your health care provider. Document Released: 07/05/2008 Document Revised: 12/16/2017 Document Reviewed: 12/16/2017 Elsevier Interactive Patient Education  2019 Elsevier Inc.  

## 2018-11-12 NOTE — Progress Notes (Signed)
Subjective:     Patient ID: Ashley Lyons , female    DOB: 01/21/1941 , 78 y.o.   MRN: [id number]   Chief Complaint  Patient presents with  . Diabetes  . Hyperlipidemia    HPI  Diabetes  She presents for her follow-up diabetic visit. She has type 2 diabetes mellitus. Her disease course has been stable. There are no hypoglycemic associated symptoms. There are no hypoglycemic complications. Risk factors for coronary artery disease include dyslipidemia, diabetes mellitus, sedentary lifestyle and post-menopausal. She is following a generally healthy diet. She participates in exercise intermittently. There is no change in her home blood glucose trend. Her breakfast blood glucose is taken between 7-8 am. Her breakfast blood glucose range is generally 90-110 mg/dl. An ACE inhibitor/angiotensin II receptor blocker is not being taken. Eye exam is current.     Past Medical History:  Diagnosis Date  . Allergy   . Diabetes mellitus   . GERD (gastroesophageal reflux disease)   . Glaucoma    right eye  . Hyperlipidemia   . Leukopenia      Family History  Problem Relation Age of Onset  . Prostate cancer Father   . Diabetes Mother   . Prostate cancer Brother   . Colon cancer Neg Hx   . Esophageal cancer Neg Hx   . Rectal cancer Neg Hx   . Stomach cancer Neg Hx      Current Outpatient Medications:  .  Barberry-Oreg Grape-Goldenseal (BERBERINE COMPLEX PO), Take 500 mg by mouth., Disp: , Rfl:  .  BIOTIN PO, Take by mouth., Disp: , Rfl:  .  CALCIUM PO, Take by mouth., Disp: , Rfl:  .  Cholecalciferol (VITAMIN D3) 50 MCG (2000 UT) capsule, Take 2,000 Units by mouth daily., Disp: , Rfl:  .  co-enzyme Q-10 30 MG capsule, Take 30 mg by mouth 3 (three) times daily., Disp: , Rfl:  .  DiphenhydrAMINE HCl (BENADRYL ALLERGY PO), Take 25 mg by mouth as needed., Disp: , Rfl:  .  LECITHIN PO, Take by mouth., Disp: , Rfl:  .  Multiple Vitamin (MULTIVITAMIN) tablet, Take 1 tablet by mouth daily.,  Disp: , Rfl:  .  omega-3 acid ethyl esters (LOVAZA) 1 g capsule, Take by mouth 2 (two) times daily., Disp: , Rfl:  .  pravastatin (PRAVACHOL) 40 MG tablet, TAKE 1 TABLET BY MOUTH ONCE DAILY MONDAY  THROUGH  FRIDAY, Disp: 90 tablet, Rfl: 2 .  SPIRULINA PO, Take by mouth., Disp: , Rfl:  .  Timolol Maleate (ISTALOL) 0.5 % (DAILY) SOLN, Apply to eye., Disp: , Rfl:  .  vitamin C (ASCORBIC ACID) 500 MG tablet, Take 500 mg by mouth daily., Disp: , Rfl:    Allergies  Allergen Reactions  . Almond Oil Hives    Almonds  . Bean Pod Extract Hives    String Beans  . Corn-Containing Products Hives  . Peanuts [Peanut Oil] Hives  . Sodium Laureth Sulfate Hives  . Soy Allergy Hives  . Strawberry Extract Hives     Review of Systems  Constitutional: Negative.   Respiratory: Negative.   Cardiovascular: Negative.   Gastrointestinal: Negative.   Musculoskeletal: Positive for myalgias (she c/o muscle aches/cramps. Wants to decrease dosing of chol meds. ).  Neurological: Negative.   Psychiatric/Behavioral: Negative.      Today's Vitals   11/12/18 0939  BP: 126/82  Pulse: (!) 49  Temp: 98.3 F (36.8 C)  TempSrc: Oral  Weight: 141 lb 3.2 oz (64 kg)  Height: 5' 0.25" (1.53 m)  PainSc: 1   PainLoc: Ankle   Body mass index is 27.35 kg/m.   Objective:  Physical Exam Vitals signs and nursing note reviewed.  Constitutional:      Appearance: Normal appearance.  HENT:     Head: Normocephalic and atraumatic.  Cardiovascular:     Rate and Rhythm: Normal rate and regular rhythm.     Heart sounds: Normal heart sounds.  Pulmonary:     Effort: Pulmonary effort is normal.     Breath sounds: Normal breath sounds.  Skin:    General: Skin is warm.  Neurological:     General: No focal deficit present.     Mental Status: She is alert.  Psychiatric:        Mood and Affect: Mood normal.        Behavior: Behavior normal.         Assessment And Plan:     1. Type 2 diabetes mellitus with stage 2  chronic kidney disease, without long-term current use of insulin ([place])  I will check labs as listed below. She is not on any medications. However, she does take supplements that she believes help her with glycemic control. She stopped meds due to previous adverse reactions. Thus far, these supplements have kept her a1c less than 7. She is encouraged to stay active.   - CMP14+EGFR - Hemoglobin A1c  2. Pure hypercholesterolemia  I will check a lipid panel at her next visit. She is encouraged to avoid fried foods and to exercise no less than 150 minutes per week.   3. Muscle cramps  I will check cpk. She is encouraged to stay well hydrated. She may benefit from CoQ10. She agrees to continue with statin use, just less frequent dosing. She will now take MWF.   - CK, total      [name], MD    THE PATIENT IS ENCOURAGED TO PRACTICE SOCIAL DISTANCING DUE TO THE COVID-19 PANDEMIC.

## 2018-11-13 LAB — CMP14+EGFR
ALT: 14 IU/L (ref 0–32)
AST: 26 IU/L (ref 0–40)
Albumin/Globulin Ratio: 1.4 (ref 1.2–2.2)
Albumin: 4.1 g/dL (ref 3.7–4.7)
Alkaline Phosphatase: 53 IU/L (ref 39–117)
BUN/Creatinine Ratio: 18 (ref 12–28)
BUN: 15 mg/dL (ref 8–27)
Bilirubin Total: 0.6 mg/dL (ref 0.0–1.2)
CO2: 24 mmol/L (ref 20–29)
Calcium: 9.3 mg/dL (ref 8.7–10.3)
Chloride: 103 mmol/L (ref 96–106)
Creatinine, Ser: 0.85 mg/dL (ref 0.57–1.00)
GFR calc Af Amer: 75 mL/min/{1.73_m2} (ref >59)
GFR calc non Af Amer: 65 mL/min/{1.73_m2} (ref >59)
Globulin, Total: 2.9 g/dL (ref 1.5–4.5)
Glucose: 95 mg/dL (ref 65–99)
Potassium: 4.6 mmol/L (ref 3.5–5.2)
Sodium: 139 mmol/L (ref 134–144)
Total Protein: 7 g/dL (ref 6.0–8.5)

## 2018-11-13 LAB — HEMOGLOBIN A1C
Est. average glucose Bld gHb Est-mCnc: 137 mg/dL
Hgb A1c MFr Bld: 6.4 % — ABNORMAL HIGH (ref 4.8–5.6)

## 2018-11-13 LAB — CK: Total CK: 94 U/L (ref 24–173)

## 2018-11-17 ENCOUNTER — Other Ambulatory Visit: Payer: Self-pay

## 2018-11-17 NOTE — Patient Outreach (Signed)
[place] [facility]) Care Management  11/17/2018  Ashley Lyons 1941/04/24 [id number]   Medication Adherence call to Ashley Lyons spoke with patient she is due on Pravastatin 40 mg she explain she had a doctors appointment last week and doctor said to take 1 tablet every other day the prescription said 1 tablet daily. Ashley Lyons is showing past due under [place].   [place] Management Direct Dial [phone]  Fax [phone] [email or web address]

## 2018-11-18 ENCOUNTER — Encounter: Payer: Self-pay | Admitting: Internal Medicine

## 2019-02-20 IMAGING — MG DIGITAL SCREENING BILATERAL MAMMOGRAM WITH TOMO AND CAD
8 series · 8 of 24 positions shown · non-contrast
Comparison: Previous exam(s).

CLINICAL DATA: Screening.

EXAM:
DIGITAL SCREENING BILATERAL MAMMOGRAM WITH TOMO AND CAD

[R MLO synth-2D]
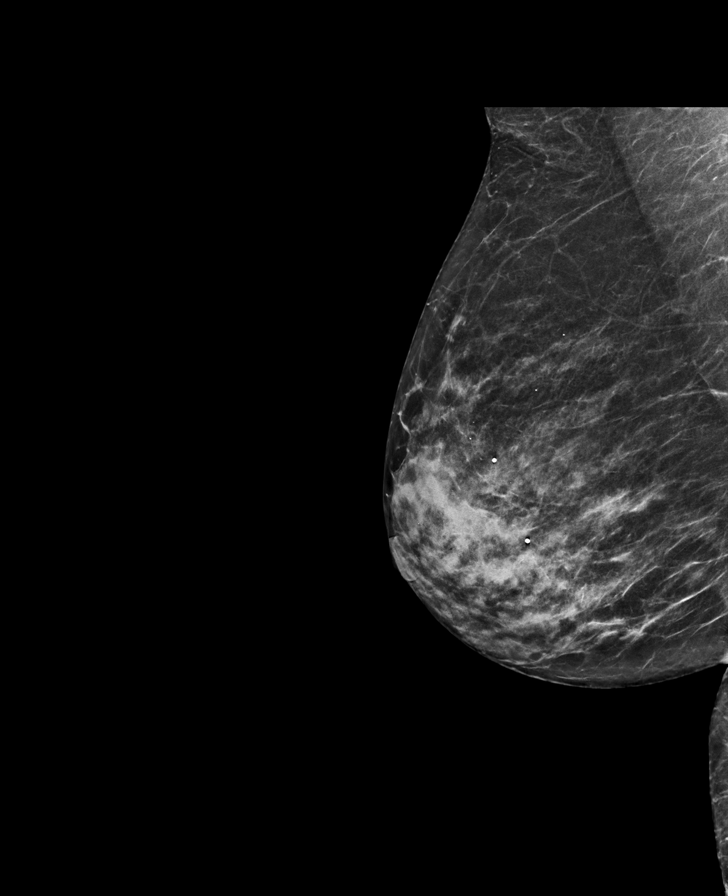

[R CC synth-2D]
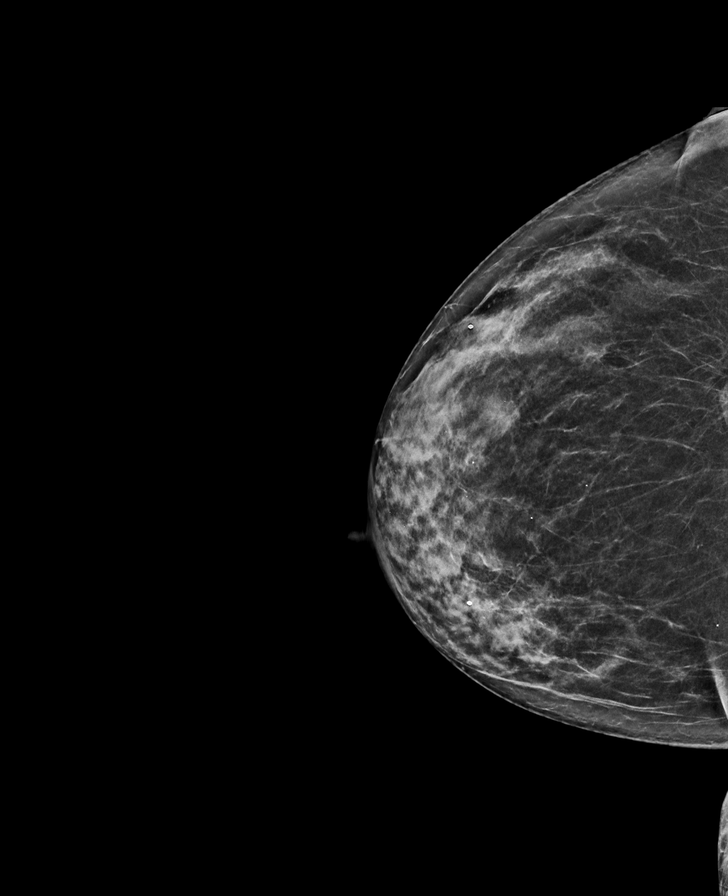

[L MLO synth-2D]
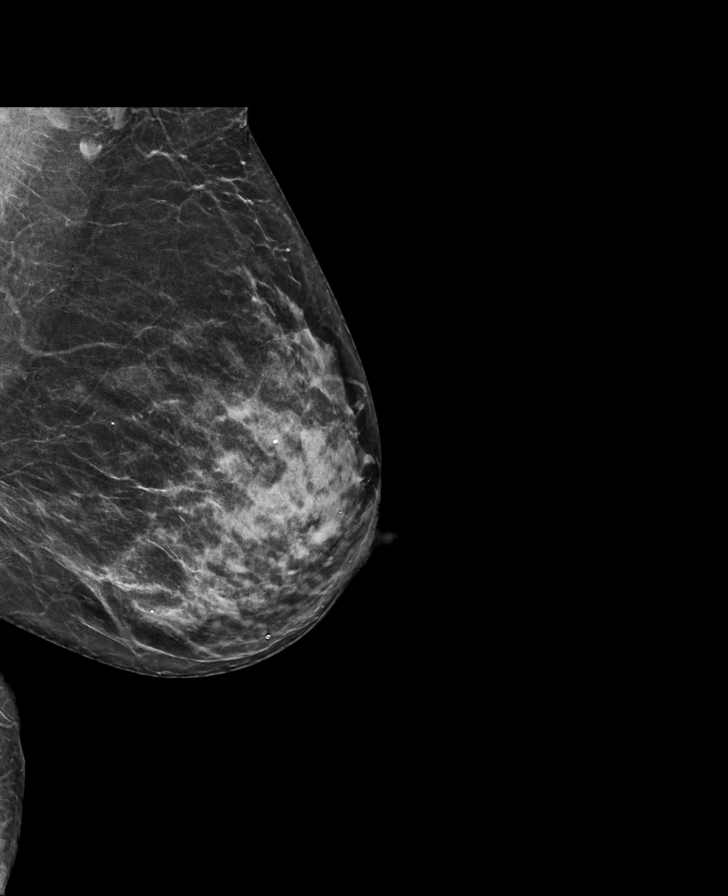

[L CC synth-2D]
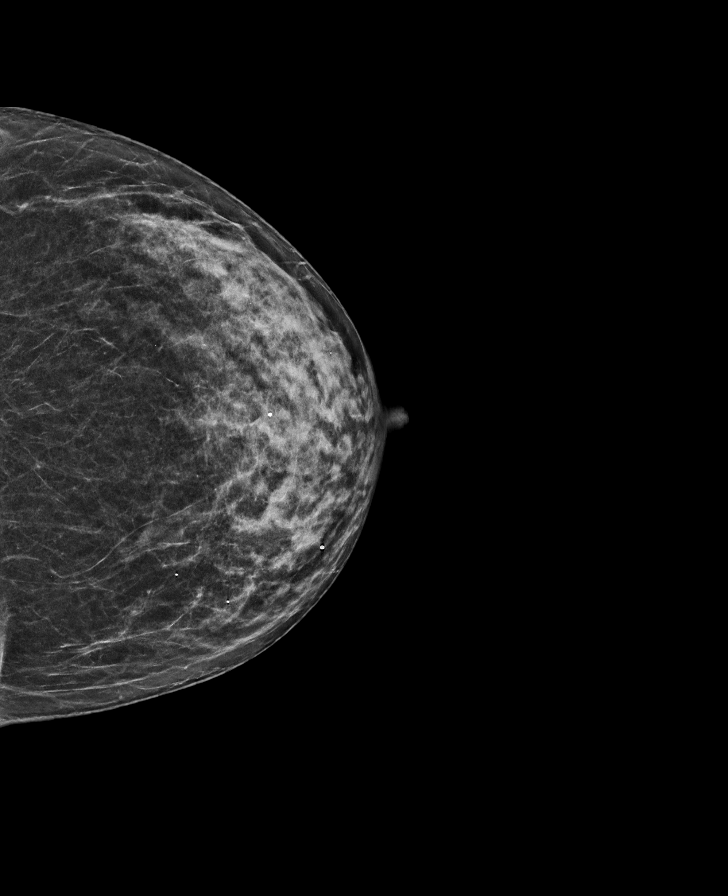

[R MLO tomo · tomo slice 33/65.0]
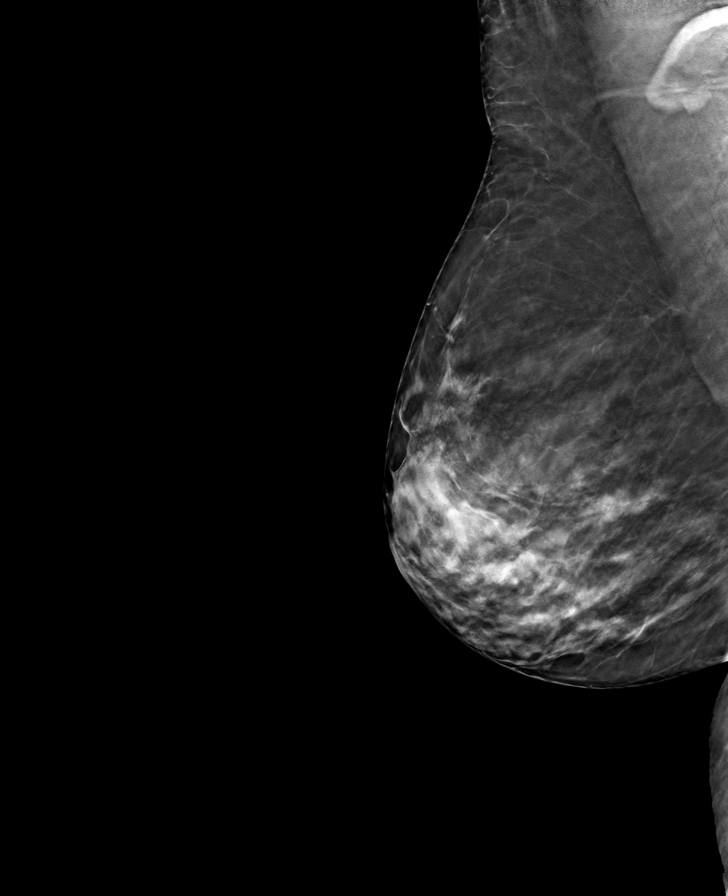

[L CC tomo · tomo slice 30/59.0]
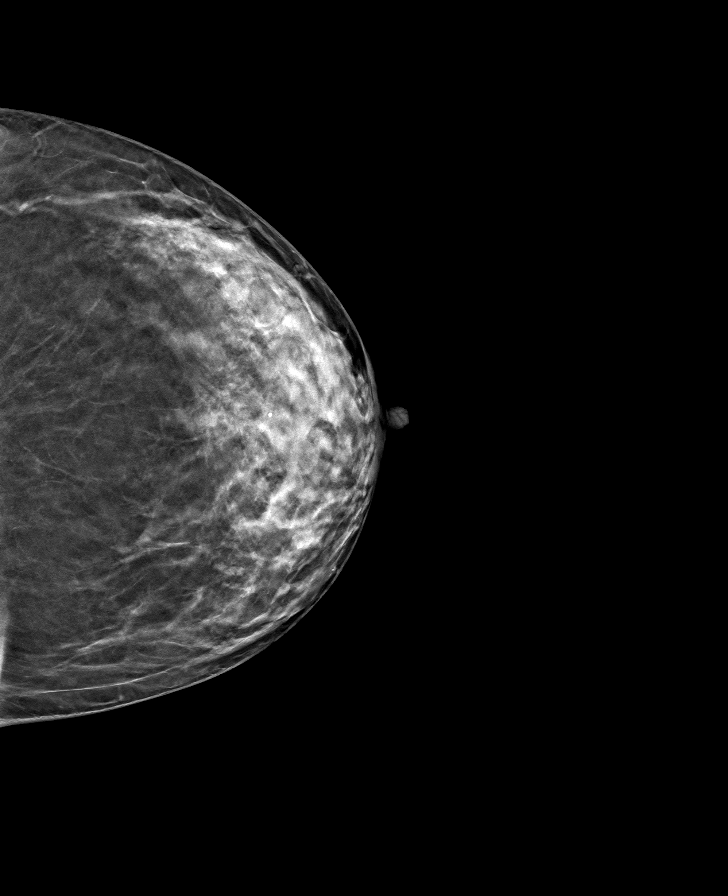

[L MLO tomo · tomo slice 35/69.0]
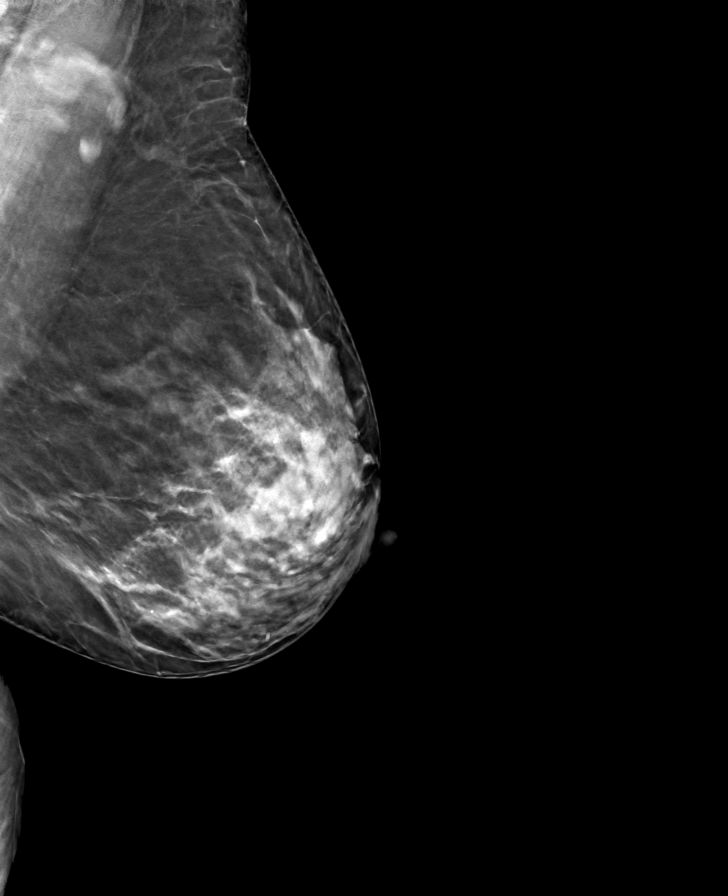

[R CC tomo · tomo slice 33/64.0]
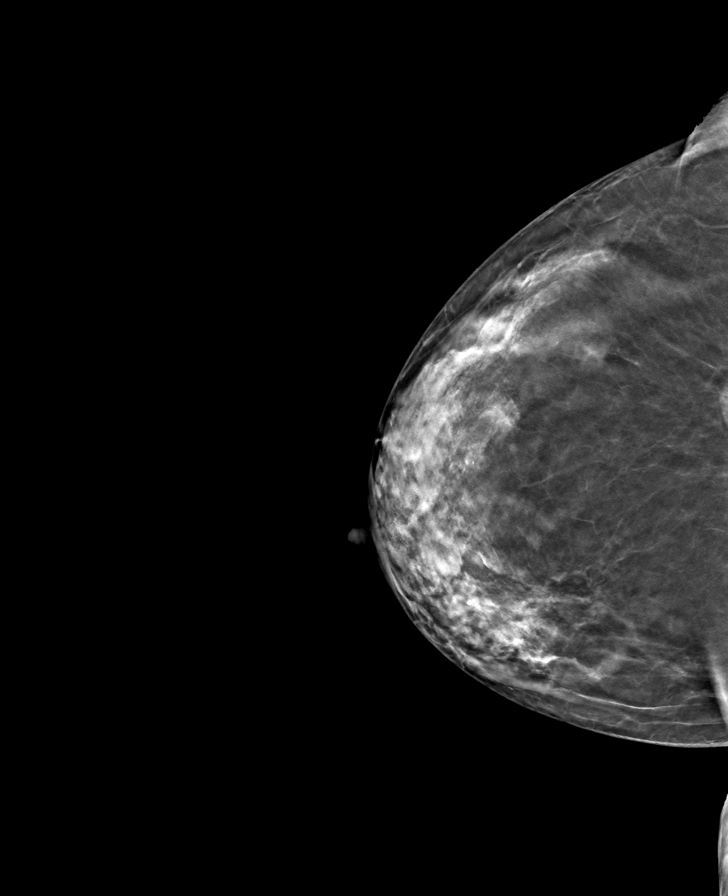

[8 of 24 positions shown; findings below may reference images not displayed]

ACR Breast Density Category c: The breast tissue is heterogeneously
dense, which may obscure small masses.
FINDINGS: There are no findings suspicious for malignancy. Images were
processed with CAD.
IMPRESSION: No mammographic evidence of malignancy. A result letter of this
screening mammogram will be mailed directly to the patient.

RECOMMENDATION:
Screening mammogram in one year. (Code:FT-U-LHB)

BI-RADS CATEGORY  1: Negative.

## 2019-04-14 ENCOUNTER — Other Ambulatory Visit: Payer: Self-pay

## 2019-04-15 ENCOUNTER — Ambulatory Visit (INDEPENDENT_AMBULATORY_CARE_PROVIDER_SITE_OTHER): Payer: Medicare Other

## 2019-04-15 ENCOUNTER — Encounter: Payer: Self-pay | Admitting: Internal Medicine

## 2019-04-15 ENCOUNTER — Ambulatory Visit (INDEPENDENT_AMBULATORY_CARE_PROVIDER_SITE_OTHER): Payer: Medicare Other | Admitting: Internal Medicine

## 2019-04-15 VITALS — BP 132/76 | HR 50 | Temp 98.5°F | Ht 60.0 in | Wt 147.2 lb

## 2019-04-15 DIAGNOSIS — D709 Neutropenia, unspecified: Secondary | ICD-10-CM | POA: Diagnosis not present

## 2019-04-15 DIAGNOSIS — E1122 Type 2 diabetes mellitus with diabetic chronic kidney disease: Secondary | ICD-10-CM

## 2019-04-15 DIAGNOSIS — R001 Bradycardia, unspecified: Secondary | ICD-10-CM | POA: Diagnosis not present

## 2019-04-15 DIAGNOSIS — Z23 Encounter for immunization: Secondary | ICD-10-CM

## 2019-04-15 DIAGNOSIS — Z0000 Encounter for general adult medical examination without abnormal findings: Secondary | ICD-10-CM | POA: Diagnosis not present

## 2019-04-15 DIAGNOSIS — N182 Chronic kidney disease, stage 2 (mild): Secondary | ICD-10-CM

## 2019-04-15 LAB — POCT UA - MICROALBUMIN
Albumin/Creatinine Ratio, Urine, POC: 30
Creatinine, POC: 100 mg/dL
Microalbumin Ur, POC: 10 mg/L

## 2019-04-15 LAB — POCT URINALYSIS DIPSTICK
Bilirubin, UA: NEGATIVE
Blood, UA: NEGATIVE
Glucose, UA: NEGATIVE
Ketones, UA: NEGATIVE
Nitrite, UA: NEGATIVE
Protein, UA: NEGATIVE
Spec Grav, UA: 1.015 (ref 1.010–1.025)
Urobilinogen, UA: 0.2 E.U./dL
pH, UA: 5.5 (ref 5.0–8.0)

## 2019-04-15 NOTE — Progress Notes (Signed)
Subjective:   Ashley Lyons is a 78 y.o. female who presents for Medicare Annual (Subsequent) preventive examination.  Review of Systems:  n/a Cardiac Risk Factors include: advanced age (>49men, >65 women);diabetes mellitus;sedentary lifestyle     Objective:     Vitals: BP 132/76 (BP Location: Left Arm, Patient Position: Sitting, Cuff Size: Normal)   Pulse (!) 50   Temp 98.5 F (36.9 C) (Oral)   Ht 5' (1.524 m)   Wt 147 lb 3.2 oz (66.8 kg)   BMI 28.75 kg/m   Body mass index is 28.75 kg/m.  Advanced Directives 04/15/2019  Does Patient Have a Medical Advance Directive? Yes  Type of Paramedic of [place];Living will  Copy of [place] in Chart? No - copy requested    Tobacco Social History   Tobacco Use  Smoking Status Never Smoker  Smokeless Tobacco Never Used     Counseling given: Not Answered   Clinical Intake:  Pre-visit preparation completed: Yes  Pain : No/denies pain     Nutritional Status: BMI 25 -29 Overweight Nutritional Risks: None Diabetes: No  How often do you need to have someone help you when you read instructions, pamphlets, or other written materials from your doctor or pharmacy?: 1 - Never What is the last grade level you completed in school?: Master's degree  Interpreter Needed?: No  Information entered by :: NAllen LPN  Past Medical History:  Diagnosis Date  . Allergy   . Diabetes mellitus   . GERD (gastroesophageal reflux disease)   . Glaucoma    right eye  . Hyperlipidemia   . Leukopenia    Past Surgical History:  Procedure Laterality Date  . APPENDECTOMY    . COLONOSCOPY    . TONSILLECTOMY    . TUBAL LIGATION    . VAGINAL HYSTERECTOMY     Family History  Problem Relation Age of Onset  . Prostate cancer Father   . Diabetes Mother   . Prostate cancer Brother   . Colon cancer Neg Hx   . Esophageal cancer Neg Hx   . Rectal cancer Neg Hx   . Stomach cancer Neg Hx    Social  History   Socioeconomic History  . Marital status: Married    Spouse name: Not on file  . Number of children: Not on file  . Years of education: Not on file  . Highest education level: Not on file  Occupational History  . Occupation: retired  Scientific laboratory technician  . Financial resource strain: Not hard at all  . Food insecurity    Worry: Never true    Inability: Never true  . Transportation needs    Medical: No    Non-medical: No  Tobacco Use  . Smoking status: Never Smoker  . Smokeless tobacco: Never Used  Substance and Sexual Activity  . Alcohol use: Yes    Comment: wine on occasion  . Drug use: No  . Sexual activity: Not Currently  Lifestyle  . Physical activity    Days per week: 0 days    Minutes per session: 0 min  . Stress: Only a little  Relationships  . Social Herbalist on phone: Not on file    Gets together: Not on file    Attends religious service: Not on file    Active member of club or organization: Not on file    Attends meetings of clubs or organizations: Not on file    Relationship  status: Not on file  Other Topics Concern  . Not on file  Social History Narrative  . Not on file    Outpatient Encounter Medications as of 04/15/2019  Medication Sig  . Barberry-Oreg Grape-Goldenseal (BERBERINE COMPLEX PO) Take 500 mg by mouth.  Marland Kitchen BIOTIN PO Take by mouth.  Marland Kitchen CALCIUM PO Take by mouth.  . Cholecalciferol (VITAMIN D3) 50 MCG (2000 UT) capsule Take 2,000 Units by mouth daily.  Marland Kitchen co-enzyme Q-10 30 MG capsule Take 30 mg by mouth 3 (three) times daily.  . DiphenhydrAMINE HCl (BENADRYL ALLERGY PO) Take 25 mg by mouth as needed.  Marland Kitchen LECITHIN PO Take by mouth.  . Multiple Vitamin (MULTIVITAMIN) tablet Take 1 tablet by mouth daily.  Marland Kitchen omega-3 acid ethyl esters (LOVAZA) 1 g capsule Take by mouth 2 (two) times daily.  . pravastatin (PRAVACHOL) 40 MG tablet TAKE 1 TABLET BY MOUTH ONCE DAILY MONDAY  THROUGH  FRIDAY  . SPIRULINA PO Take by mouth.  . Timolol Maleate  (ISTALOL) 0.5 % (DAILY) SOLN Apply to eye.  . vitamin C (ASCORBIC ACID) 500 MG tablet Take 500 mg by mouth daily.   No facility-administered encounter medications on file as of 04/15/2019.     Activities of Daily Living In your present state of health, do you have any difficulty performing the following activities: 04/15/2019  Hearing? N  Vision? N  Difficulty concentrating or making decisions? N  Walking or climbing stairs? N  Dressing or bathing? N  Doing errands, shopping? N  Preparing Food and eating ? N  Using the Toilet? N  In the past six months, have you accidently leaked urine? Y  Comment if held too long  Do you have problems with loss of bowel control? N  Managing your Medications? N  Managing your Finances? N  Housekeeping or managing your Housekeeping? N  Some recent data might be hidden    Patient Care Team: Glendale Chard, MD as PCP - General (Internal Medicine)    Assessment:   This is a routine wellness examination for Maurita.  Exercise Activities and Dietary recommendations Current Exercise Habits: The patient does not participate in regular exercise at present  Goals    . Weight (lb) < 200 lb (90.7 kg)     04/15/2019, would like to weigh 135 pounds       Fall Risk Fall Risk  04/15/2019 11/12/2018 07/15/2018  Falls in the past year? 0 1 0  Number falls in past yr: - 0 -  Injury with Fall? - 0 -  Follow up Falls evaluation completed;Education provided;Falls prevention discussed - -   Is the patient's home free of loose throw rugs in walkways, pet beds, electrical cords, etc?   yes      Grab bars in the bathroom? no      Handrails on the stairs?   yes      Adequate lighting?   yes  Timed Get Up and Go performed: n/a  Depression Screen PHQ 2/9 Scores 04/15/2019 11/12/2018 07/15/2018 08/03/2015  PHQ - 2 Score 0 0 0 0  PHQ- 9 Score 0 - - -     Cognitive Function     6CIT Screen 04/15/2019  What Year? 0 points  What month? 0 points  What time? 0 points   Count back from 20 0 points  Months in reverse 0 points  Repeat phrase 0 points  Total Score 0    Immunization History  Administered Date(s) Administered  . DTaP 10/27/2012  .  Influenza-Unspecified 05/15/2018  . Pneumococcal Conjugate-13 07/11/2016  . Pneumococcal-Unspecified 10/27/2010, 07/21/2016    Qualifies for Shingles Vaccine? yes  Screening Tests Health Maintenance  Topic Date Due  . INFLUENZA VACCINE  03/07/2019  . URINE MICROALBUMIN  07/16/2019  . TETANUS/TDAP  10/28/2022  . DEXA SCAN  Completed  . PNA vac Low Risk Adult  Completed    Cancer Screenings: Lung: Low Dose CT Chest recommended if Age 64-80 years, 30 pack-year currently smoking OR have quit w/in 15years. Patient does not qualify. Breast:  Up to date on Mammogram? Yes   Up to date of Bone Density/Dexa? Yes Colorectal: up to date  Additional Screenings: : Hepatitis C Screening: n/a     Plan:    Patient would like to weigh 135 pounds.   I have personally reviewed and noted the following in the patient's chart:   . Medical and social history . Use of alcohol, tobacco or illicit drugs  . Current medications and supplements . Functional ability and status . Nutritional status . Physical activity . Advanced directives . List of other physicians . Hospitalizations, surgeries, and ER visits in previous 12 months . Vitals . Screenings to include cognitive, depression, and falls . Referrals and appointments  In addition, I have reviewed and discussed with patient certain preventive protocols, quality metrics, and best practice recommendations. A written personalized care plan for preventive services as well as general preventive health recommendations were provided to patient.     Kellie Simmering, LPN  [phone]

## 2019-04-15 NOTE — Progress Notes (Signed)
Subjective:     Patient ID: Ashley Lyons , female    DOB: 1940-12-09 , 78 y.o.   MRN: [id number]   Chief Complaint  Patient presents with  . Annual Exam  . Diabetes    HPI  She is here today for full physical examination. She also had AWV performed by [facility] today. She has no specific concerns or complaints at this time.   Diabetes She presents for her follow-up diabetic visit. She has type 2 diabetes mellitus. There are no hypoglycemic associated symptoms. Pertinent negatives for diabetes include no blurred vision and no chest pain. There are no hypoglycemic complications. Risk factors for coronary artery disease include diabetes mellitus, dyslipidemia, sedentary lifestyle and post-menopausal. Current diabetic treatment includes diet. She is compliant with treatment most of the time. She is following a diabetic diet. She participates in exercise intermittently. An ACE inhibitor/angiotensin II receptor blocker is not being taken. Eye exam is current.     Past Medical History:  Diagnosis Date  . Allergy   . Diabetes mellitus   . GERD (gastroesophageal reflux disease)   . Glaucoma    right eye  . Hyperlipidemia   . Leukopenia      Family History  Problem Relation Age of Onset  . Prostate cancer Father   . Diabetes Mother   . Prostate cancer Brother   . Colon cancer Neg Hx   . Esophageal cancer Neg Hx   . Rectal cancer Neg Hx   . Stomach cancer Neg Hx      Current Outpatient Medications:  .  Barberry-Oreg Grape-Goldenseal (BERBERINE COMPLEX PO), Take 500 mg by mouth., Disp: , Rfl:  .  BIOTIN PO, Take by mouth., Disp: , Rfl:  .  CALCIUM PO, Take by mouth., Disp: , Rfl:  .  Cholecalciferol (VITAMIN D3) 50 MCG (2000 UT) capsule, Take 2,000 Units by mouth daily., Disp: , Rfl:  .  co-enzyme Q-10 30 MG capsule, Take 30 mg by mouth 3 (three) times daily., Disp: , Rfl:  .  DiphenhydrAMINE HCl (BENADRYL ALLERGY PO), Take 25 mg by mouth as needed., Disp: , Rfl:  .   LECITHIN PO, Take by mouth., Disp: , Rfl:  .  Multiple Vitamin (MULTIVITAMIN) tablet, Take 1 tablet by mouth daily., Disp: , Rfl:  .  omega-3 acid ethyl esters (LOVAZA) 1 g capsule, Take by mouth 2 (two) times daily., Disp: , Rfl:  .  pravastatin (PRAVACHOL) 40 MG tablet, TAKE 1 TABLET BY MOUTH ONCE DAILY MONDAY  THROUGH  FRIDAY, Disp: 90 tablet, Rfl: 2 .  SPIRULINA PO, Take by mouth., Disp: , Rfl:  .  Timolol Maleate (ISTALOL) 0.5 % (DAILY) SOLN, Apply to eye., Disp: , Rfl:  .  vitamin C (ASCORBIC ACID) 500 MG tablet, Take 500 mg by mouth daily., Disp: , Rfl:    Allergies  Allergen Reactions  . Almond Oil Hives    Almonds  . Bean Pod Extract Hives    String Beans  . Corn-Containing Products Hives  . Peanuts [Peanut Oil] Hives  . Sodium Laureth Sulfate Hives  . Soy Allergy Hives  . Strawberry Extract Hives     The patient states she uses post menopausal status for birth control. Last LMP was No LMP recorded. Patient has had a hysterectomy.. Negative for DysmenorrheaNegative for: breast discharge, breast lump(s), breast pain and breast self exam. Associated symptoms include abnormal vaginal bleeding. Pertinent negatives include abnormal bleeding (hematology), anxiety, decreased libido, depression, difficulty falling sleep, dyspareunia, history  of infertility, nocturia, sexual dysfunction, sleep disturbances, urinary incontinence, urinary urgency, vaginal discharge and vaginal itching. Diet regular.The patient states her exercise level is  intermittent.  . The patient's tobacco use is:  Social History   Tobacco Use  Smoking Status Never Smoker  Smokeless Tobacco Never Used  . She has been exposed to passive smoke. The patient's alcohol use is:  Social History   Substance and Sexual Activity  Alcohol Use Yes   Comment: wine on occasion    Review of Systems  Constitutional: Negative.   HENT: Negative.   Eyes: Negative.  Negative for blurred vision.  Respiratory: Negative.    Cardiovascular: Negative.  Negative for chest pain.  Endocrine: Negative.   Genitourinary: Negative.   Musculoskeletal: Negative.   Skin: Negative.   Allergic/Immunologic: Negative.   Neurological: Negative.   Hematological: Negative.   Psychiatric/Behavioral: Negative.      Today's Vitals   04/15/19 0909  BP: 132/76  Pulse: (!) 50  Temp: 98.5 F (36.9 C)  TempSrc: Oral  SpO2: 96%  Weight: 147 lb 3.2 oz (66.8 kg)  Height: 5' (1.524 m)   Body mass index is 28.75 kg/m.   Objective:  Physical Exam Vitals signs and nursing note reviewed.  Constitutional:      Appearance: Normal appearance.  HENT:     Head: Normocephalic and atraumatic.     Right Ear: Tympanic membrane, ear canal and external ear normal.     Left Ear: Tympanic membrane, ear canal and external ear normal.     Nose: Nose normal.     Mouth/Throat:     Mouth: Mucous membranes are moist.     Pharynx: Oropharynx is clear.  Eyes:     Extraocular Movements: Extraocular movements intact.     Conjunctiva/sclera: Conjunctivae normal.     Pupils: Pupils are equal, round, and reactive to light.  Neck:     Musculoskeletal: Normal range of motion and neck supple.  Cardiovascular:     Rate and Rhythm: Normal rate and regular rhythm.     Pulses:          Dorsalis pedis pulses are 2+ on the right side and 2+ on the left side.       Posterior tibial pulses are 1+ on the right side and 1+ on the left side.     Heart sounds: Normal heart sounds.  Pulmonary:     Effort: Pulmonary effort is normal.     Breath sounds: Normal breath sounds.  Chest:     Breasts: Tanner Score is 5.        Right: Normal. No swelling, bleeding, inverted nipple, mass, nipple discharge or skin change.        Left: Normal. No swelling, bleeding, inverted nipple, mass, nipple discharge or skin change.  Abdominal:     General: Abdomen is flat. Bowel sounds are normal.     Palpations: Abdomen is soft.  Genitourinary:    Comments:  deferred Musculoskeletal: Normal range of motion.  Feet:     Right foot:     Protective Sensation: 5 sites tested. 5 sites sensed.     Skin integrity: Skin integrity normal.     Toenail Condition: Right toenails are normal.     Left foot:     Protective Sensation: 5 sites tested. 5 sites sensed.     Skin integrity: Skin integrity normal.     Toenail Condition: Left toenails are normal.  Skin:    General: Skin is warm and dry.  Neurological:     General: No focal deficit present.     Mental Status: She is alert and oriented to person, place, and time.  Psychiatric:        Mood and Affect: Mood normal.        Behavior: Behavior normal.         Assessment And Plan:     1. Routine general medical examination at health care facility  A full exam was performed. Importance of monthly self breast exams was discussed with the patient.  PATIENT HAS BEEN ADVISED TO GET 30-45 MINUTES REGULAR EXERCISE NO LESS THAN FOUR TO FIVE DAYS PER WEEK - BOTH WEIGHTBEARING EXERCISES AND AEROBIC ARE RECOMMENDED.  SHE WAS ADVISED TO FOLLOW A HEALTHY DIET WITH AT LEAST SIX FRUITS/VEGGIES PER DAY, DECREASE INTAKE OF RED MEAT, AND TO INCREASE FISH INTAKE TO TWO DAYS PER WEEK.  MEATS/FISH SHOULD NOT BE FRIED, BAKED OR BROILED IS PREFERABLE.  I SUGGEST WEARING SPF 50 SUNSCREEN ON EXPOSED PARTS AND ESPECIALLY WHEN IN THE DIRECT SUNLIGHT FOR AN EXTENDED PERIOD OF TIME.  PLEASE AVOID FAST FOOD RESTAURANTS AND INCREASE YOUR WATER INTAKE.   2. Type 2 diabetes mellitus with stage 2 chronic kidney disease, without long-term current use of insulin ([place])  Diabetic foot exam was performed.  I DISCUSSED WITH THE PATIENT AT LENGTH REGARDING THE GOALS OF GLYCEMIC CONTROL AND POSSIBLE LONG-TERM COMPLICATIONS.  I  ALSO STRESSED THE IMPORTANCE OF COMPLIANCE WITH HOME GLUCOSE MONITORING, DIETARY RESTRICTIONS INCLUDING AVOIDANCE OF SUGARY DRINKS/PROCESSED FOODS,  ALONG WITH REGULAR EXERCISE.  I  ALSO STRESSED THE IMPORTANCE OF ANNUAL  EYE EXAMS, SELF FOOT CARE AND COMPLIANCE WITH OFFICE VISITS.  - CMP14+EGFR - CBC - Lipid panel - Hemoglobin A1c - POCT Urinalysis Dipstick (81002) - POCT UA - Microalbumin  3. Neutropenia, unspecified type ([place])  Chronic. I will check CBC today.   4. Bradycardia  Chronic. However, today her HR is lowest it has been. She denies having SOB or worsening fatigue. However, I would like to refer her to Cardiology for further evaluation. I will also check thyroid function today.  OF note, it was normal in August 2019.  - TSH - EKG 12-Lead        [name], MD    THE PATIENT IS ENCOURAGED TO PRACTICE SOCIAL DISTANCING DUE TO THE COVID-19 PANDEMIC.

## 2019-04-15 NOTE — Patient Instructions (Signed)
Ashley Lyons , Thank you for taking time to come for your Medicare Wellness Visit. I appreciate your ongoing commitment to your health goals. Please review the following plan we discussed and let me know if I can assist you in the future.   Screening recommendations/referrals: Colonoscopy: 04/2012 Mammogram: 06/2018 Bone Density: 06/2018 Recommended yearly ophthalmology/optometry visit for glaucoma screening and checkup Recommended yearly dental visit for hygiene and checkup  Vaccinations: Influenza vaccine: today Pneumococcal vaccine: 07/2016 Tdap vaccine: 10/2012 Shingles vaccine: discussed    Advanced directives: Please bring a copy of your POA (Power of Alice) and/or Living Will to your next appointment.    Conditions/risks identified: overweight  Next appointment: 04/20/2020 at 8:30   Preventive Care 29 Years and Older, Female Preventive care refers to lifestyle choices and visits with your health care provider that can promote health and wellness. What does preventive care include?  A yearly physical exam. This is also called an annual well check.  Dental exams once or twice a year.  Routine eye exams. Ask your health care provider how often you should have your eyes checked.  Personal lifestyle choices, including:  Daily care of your teeth and gums.  Regular physical activity.  Eating a healthy diet.  Avoiding tobacco and drug use.  Limiting alcohol use.  Practicing safe sex.  Taking low-dose aspirin every day.  Taking vitamin and mineral supplements as recommended by your health care provider. What happens during an annual well check? The services and screenings done by your health care provider during your annual well check will depend on your age, overall health, lifestyle risk factors, and family history of disease. Counseling  Your health care provider may ask you questions about your:  Alcohol use.  Tobacco use.  Drug use.  Emotional  well-being.  Home and relationship well-being.  Sexual activity.  Eating habits.  History of falls.  Memory and ability to understand (cognition).  Work and work Statistician.  Reproductive health. Screening  You may have the following tests or measurements:  Height, weight, and BMI.  Blood pressure.  Lipid and cholesterol levels. These may be checked every 5 years, or more frequently if you are over 87 years old.  Skin check.  Lung cancer screening. You may have this screening every year starting at age 54 if you have a 30-pack-year history of smoking and currently smoke or have quit within the past 15 years.  Fecal occult blood test (FOBT) of the stool. You may have this test every year starting at age 58.  Flexible sigmoidoscopy or colonoscopy. You may have a sigmoidoscopy every 5 years or a colonoscopy every 10 years starting at age 53.  Hepatitis C blood test.  Hepatitis B blood test.  Sexually transmitted disease (STD) testing.  Diabetes screening. This is done by checking your blood sugar (glucose) after you have not eaten for a while (fasting). You may have this done every 1-3 years.  Bone density scan. This is done to screen for osteoporosis. You may have this done starting at age 90.  Mammogram. This may be done every 1-2 years. Talk to your health care provider about how often you should have regular mammograms. Talk with your health care provider about your test results, treatment options, and if necessary, the need for more tests. Vaccines  Your health care provider may recommend certain vaccines, such as:  Influenza vaccine. This is recommended every year.  Tetanus, diphtheria, and acellular pertussis (Tdap, Td) vaccine. You may need a Td booster  every 10 years.  Zoster vaccine. You may need this after age 80.  Pneumococcal 13-valent conjugate (PCV13) vaccine. One dose is recommended after age 88.  Pneumococcal polysaccharide (PPSV23) vaccine. One  dose is recommended after age 62. Talk to your health care provider about which screenings and vaccines you need and how often you need them. This information is not intended to replace advice given to you by your health care provider. Make sure you discuss any questions you have with your health care provider. Document Released: 08/19/2015 Document Revised: 04/11/2016 Document Reviewed: 05/24/2015 Elsevier Interactive Patient Education  2017 [place] Prevention in the Home Falls can cause injuries. They can happen to people of all ages. There are many things you can do to make your home safe and to help prevent falls. What can I do on the outside of my home?  Regularly fix the edges of walkways and driveways and fix any cracks.  Remove anything that might make you trip as you walk through a door, such as a raised step or threshold.  Trim any bushes or trees on the path to your home.  Use bright outdoor lighting.  Clear any walking paths of anything that might make someone trip, such as rocks or tools.  Regularly check to see if handrails are loose or broken. Make sure that both sides of any steps have handrails.  Any raised decks and porches should have guardrails on the edges.  Have any leaves, snow, or ice cleared regularly.  Use sand or salt on walking paths during winter.  Clean up any spills in your garage right away. This includes oil or grease spills. What can I do in the bathroom?  Use night lights.  Install grab bars by the toilet and in the tub and shower. Do not use towel bars as grab bars.  Use non-skid mats or decals in the tub or shower.  If you need to sit down in the shower, use a plastic, non-slip stool.  Keep the floor dry. Clean up any water that spills on the floor as soon as it happens.  Remove soap buildup in the tub or shower regularly.  Attach bath mats securely with double-sided non-slip rug tape.  Do not have throw rugs and other  things on the floor that can make you trip. What can I do in the bedroom?  Use night lights.  Make sure that you have a light by your bed that is easy to reach.  Do not use any sheets or blankets that are too big for your bed. They should not hang down onto the floor.  Have a firm chair that has side arms. You can use this for support while you get dressed.  Do not have throw rugs and other things on the floor that can make you trip. What can I do in the kitchen?  Clean up any spills right away.  Avoid walking on wet floors.  Keep items that you use a lot in easy-to-reach places.  If you need to reach something above you, use a strong step stool that has a grab bar.  Keep electrical cords out of the way.  Do not use floor polish or wax that makes floors slippery. If you must use wax, use non-skid floor wax.  Do not have throw rugs and other things on the floor that can make you trip. What can I do with my stairs?  Do not leave any items on the stairs.  Make  sure that there are handrails on both sides of the stairs and use them. Fix handrails that are broken or loose. Make sure that handrails are as long as the stairways.  Check any carpeting to make sure that it is firmly attached to the stairs. Fix any carpet that is loose or worn.  Avoid having throw rugs at the top or bottom of the stairs. If you do have throw rugs, attach them to the floor with carpet tape.  Make sure that you have a light switch at the top of the stairs and the bottom of the stairs. If you do not have them, ask someone to add them for you. What else can I do to help prevent falls?  Wear shoes that:  Do not have high heels.  Have rubber bottoms.  Are comfortable and fit you well.  Are closed at the toe. Do not wear sandals.  If you use a stepladder:  Make sure that it is fully opened. Do not climb a closed stepladder.  Make sure that both sides of the stepladder are locked into place.  Ask  someone to hold it for you, if possible.  Clearly mark and make sure that you can see:  Any grab bars or handrails.  First and last steps.  Where the edge of each step is.  Use tools that help you move around (mobility aids) if they are needed. These include:  Canes.  Walkers.  Scooters.  Crutches.  Turn on the lights when you go into a dark area. Replace any light bulbs as soon as they burn out.  Set up your furniture so you have a clear path. Avoid moving your furniture around.  If any of your floors are uneven, fix them.  If there are any pets around you, be aware of where they are.  Review your medicines with your doctor. Some medicines can make you feel dizzy. This can increase your chance of falling. Ask your doctor what other things that you can do to help prevent falls. This information is not intended to replace advice given to you by your health care provider. Make sure you discuss any questions you have with your health care provider. Document Released: 05/19/2009 Document Revised: 12/29/2015 Document Reviewed: 08/27/2014 Elsevier Interactive Patient Education  2017 [employer].

## 2019-04-15 NOTE — Patient Instructions (Signed)
Health Maintenance, Female Adopting a healthy lifestyle and getting preventive care are important in promoting health and wellness. Ask your health care provider about:  The right schedule for you to have regular tests and exams.  Things you can do on your own to prevent diseases and keep yourself healthy. What should I know about diet, weight, and exercise? Eat a healthy diet   Eat a diet that includes plenty of vegetables, fruits, low-fat dairy products, and lean protein.  Do not eat a lot of foods that are high in solid fats, added sugars, or sodium. Maintain a healthy weight Body mass index (BMI) is used to identify weight problems. It estimates body fat based on height and weight. Your health care provider can help determine your BMI and help you achieve or maintain a healthy weight. Get regular exercise Get regular exercise. This is one of the most important things you can do for your health. Most adults should:  Exercise for at least 150 minutes each week. The exercise should increase your heart rate and make you sweat (moderate-intensity exercise).  Do strengthening exercises at least twice a week. This is in addition to the moderate-intensity exercise.  Spend less time sitting. Even light physical activity can be beneficial. Watch cholesterol and blood lipids Have your blood tested for lipids and cholesterol at 80 years of age, then have this test every 5 years. Have your cholesterol levels checked more often if:  Your lipid or cholesterol levels are high.  You are older than 80 years of age.  You are at high risk for heart disease. What should I know about cancer screening? Depending on your health history and family history, you may need to have cancer screening at various ages. This may include screening for:  Breast cancer.  Cervical cancer.  Colorectal cancer.  Skin cancer.  Lung cancer. What should I know about heart disease, diabetes, and high blood  pressure? Blood pressure and heart disease  High blood pressure causes heart disease and increases the risk of stroke. This is more likely to develop in people who have high blood pressure readings, are of African descent, or are overweight.  Have your blood pressure checked: ? Every 3-5 years if you are 18-39 years of age. ? Every year if you are 40 years old or older. Diabetes Have regular diabetes screenings. This checks your fasting blood sugar level. Have the screening done:  Once every three years after age 40 if you are at a normal weight and have a low risk for diabetes.  More often and at a younger age if you are overweight or have a high risk for diabetes. What should I know about preventing infection? Hepatitis B If you have a higher risk for hepatitis B, you should be screened for this virus. Talk with your health care provider to find out if you are at risk for hepatitis B infection. Hepatitis C Testing is recommended for:  Everyone born from 1945 through 1965.  Anyone with known risk factors for hepatitis C. Sexually transmitted infections (STIs)  Get screened for STIs, including gonorrhea and chlamydia, if: ? You are sexually active and are younger than 80 years of age. ? You are older than 80 years of age and your health care provider tells you that you are at risk for this type of infection. ? Your sexual activity has changed since you were last screened, and you are at increased risk for chlamydia or gonorrhea. Ask your health care provider if   you are at risk.  Ask your health care provider about whether you are at high risk for HIV. Your health care provider may recommend a prescription medicine to help prevent HIV infection. If you choose to take medicine to prevent HIV, you should first get tested for HIV. You should then be tested every 3 months for as long as you are taking the medicine. Pregnancy  If you are about to stop having your period (premenopausal) and  you may become pregnant, seek counseling before you get pregnant.  Take 400 to 800 micrograms (mcg) of folic acid every day if you become pregnant.  Ask for birth control (contraception) if you want to prevent pregnancy. Osteoporosis and menopause Osteoporosis is a disease in which the bones lose minerals and strength with aging. This can result in bone fractures. If you are 65 years old or older, or if you are at risk for osteoporosis and fractures, ask your health care provider if you should:  Be screened for bone loss.  Take a calcium or vitamin D supplement to lower your risk of fractures.  Be given hormone replacement therapy (HRT) to treat symptoms of menopause. Follow these instructions at home: Lifestyle  Do not use any products that contain nicotine or tobacco, such as cigarettes, e-cigarettes, and chewing tobacco. If you need help quitting, ask your health care provider.  Do not use street drugs.  Do not share needles.  Ask your health care provider for help if you need support or information about quitting drugs. Alcohol use  Do not drink alcohol if: ? Your health care provider tells you not to drink. ? You are pregnant, may be pregnant, or are planning to become pregnant.  If you drink alcohol: ? Limit how much you use to 0-1 drink a day. ? Limit intake if you are breastfeeding.  Be aware of how much alcohol is in your drink. In the [place] one drink equals one 12 oz bottle of beer (355 mL), one 5 oz glass of wine (148 mL), or one 1 oz glass of hard liquor (44 mL). General instructions  Schedule regular health, dental, and eye exams.  Stay current with your vaccines.  Tell your health care provider if: ? You often feel depressed. ? You have ever been abused or do not feel safe at home. Summary  Adopting a healthy lifestyle and getting preventive care are important in promoting health and wellness.  Follow your health care provider's instructions about healthy  diet, exercising, and getting tested or screened for diseases.  Follow your health care provider's instructions on monitoring your cholesterol and blood pressure. This information is not intended to replace advice given to you by your health care provider. Make sure you discuss any questions you have with your health care provider. Document Released: 02/05/2011 Document Revised: 07/16/2018 Document Reviewed: 07/16/2018 Elsevier Patient Education  2020 Elsevier Inc.  

## 2019-04-16 LAB — CBC
Hematocrit: 42.2 % (ref 34.0–46.6)
Hemoglobin: 13.9 g/dL (ref 11.1–15.9)
MCH: 33.2 pg — ABNORMAL HIGH (ref 26.6–33.0)
MCHC: 32.9 g/dL (ref 31.5–35.7)
MCV: 101 fL — ABNORMAL HIGH (ref 79–97)
Platelets: 268 10*3/uL (ref 150–450)
RBC: 4.19 x10E6/uL (ref 3.77–5.28)
RDW: 13.1 % (ref 11.7–15.4)
WBC: 3.8 10*3/uL (ref 3.4–10.8)

## 2019-04-16 LAB — LIPID PANEL
Chol/HDL Ratio: 3.1 ratio (ref 0.0–4.4)
Cholesterol, Total: 227 mg/dL — ABNORMAL HIGH (ref 100–199)
HDL: 73 mg/dL (ref >39)
LDL Chol Calc (NIH): 143 mg/dL — ABNORMAL HIGH (ref 0–99)
Triglycerides: 65 mg/dL (ref 0–149)
VLDL Cholesterol Cal: 11 mg/dL (ref 5–40)

## 2019-04-16 LAB — CMP14+EGFR
ALT: 14 IU/L (ref 0–32)
AST: 24 IU/L (ref 0–40)
Albumin/Globulin Ratio: 1.3 (ref 1.2–2.2)
Albumin: 4.2 g/dL (ref 3.7–4.7)
Alkaline Phosphatase: 58 IU/L (ref 39–117)
BUN/Creatinine Ratio: 16 (ref 12–28)
BUN: 13 mg/dL (ref 8–27)
Bilirubin Total: 0.6 mg/dL (ref 0.0–1.2)
CO2: 24 mmol/L (ref 20–29)
Calcium: 9.3 mg/dL (ref 8.7–10.3)
Chloride: 102 mmol/L (ref 96–106)
Creatinine, Ser: 0.79 mg/dL (ref 0.57–1.00)
GFR calc Af Amer: 82 mL/min/{1.73_m2} (ref >59)
GFR calc non Af Amer: 71 mL/min/{1.73_m2} (ref >59)
Globulin, Total: 3.3 g/dL (ref 1.5–4.5)
Glucose: 110 mg/dL — ABNORMAL HIGH (ref 65–99)
Potassium: 4.1 mmol/L (ref 3.5–5.2)
Sodium: 140 mmol/L (ref 134–144)
Total Protein: 7.5 g/dL (ref 6.0–8.5)

## 2019-04-16 LAB — TSH: TSH: 2.29 u[IU]/mL (ref 0.450–4.500)

## 2019-04-16 LAB — HEMOGLOBIN A1C
Est. average glucose Bld gHb Est-mCnc: 137 mg/dL
Hgb A1c MFr Bld: 6.4 % — ABNORMAL HIGH (ref 4.8–5.6)

## 2019-04-19 NOTE — Addendum Note (Signed)
Addended by: [name] on: 04/19/2019 02:02 PM   Modules accepted: Orders

## 2019-05-05 DIAGNOSIS — H04123 Dry eye syndrome of bilateral lacrimal glands: Secondary | ICD-10-CM | POA: Diagnosis not present

## 2019-05-05 DIAGNOSIS — H401111 Primary open-angle glaucoma, right eye, mild stage: Secondary | ICD-10-CM | POA: Diagnosis not present

## 2019-05-05 DIAGNOSIS — H401122 Primary open-angle glaucoma, left eye, moderate stage: Secondary | ICD-10-CM | POA: Diagnosis not present

## 2019-05-05 DIAGNOSIS — H26492 Other secondary cataract, left eye: Secondary | ICD-10-CM | POA: Diagnosis not present

## 2019-06-24 ENCOUNTER — Ambulatory Visit: Payer: Medicare Other | Admitting: Cardiovascular Disease

## 2019-06-24 ENCOUNTER — Encounter: Payer: Self-pay | Admitting: Cardiovascular Disease

## 2019-06-24 ENCOUNTER — Other Ambulatory Visit: Payer: Self-pay

## 2019-06-24 VITALS — BP 143/71 | HR 50 | Temp 96.9°F | Ht 62.0 in | Wt 145.0 lb

## 2019-06-24 DIAGNOSIS — Z5181 Encounter for therapeutic drug level monitoring: Secondary | ICD-10-CM | POA: Diagnosis not present

## 2019-06-24 DIAGNOSIS — E7800 Pure hypercholesterolemia, unspecified: Secondary | ICD-10-CM

## 2019-06-24 DIAGNOSIS — R001 Bradycardia, unspecified: Secondary | ICD-10-CM | POA: Diagnosis not present

## 2019-06-24 MED ORDER — PRAVASTATIN SODIUM 20 MG PO TABS: 20.0000 mg | ORAL_TABLET | Freq: Every day | ORAL | 3 refills | Status: DC

## 2019-06-24 NOTE — Patient Instructions (Signed)
Medication Instructions:  CHANGE YOUR PRAVACHOL TO 20 MG DAILY   *If you need a refill on your cardiac medications before your next appointment, please call your pharmacy*  Lab Work: FASTING LP/CMET IN 6 MONTHS   If you have labs (blood work) drawn today and your tests are completely normal, you will receive your results only by: Marland Kitchen MyChart Message (if you have MyChart) OR . A paper copy in the mail If you have any lab test that is abnormal or we need to change your treatment, we will call you to review the results.  Testing/Procedures: NONE  Follow-Up: At [facility], you and your health needs are our priority.  As part of our continuing mission to provide you with exceptional heart care, we have created designated Provider Care Teams.  These Care Teams include your primary Cardiologist (physician) and Advanced Practice Providers (APPs -  Physician Assistants and Nurse Practitioners) who all work together to provide you with the care you need, when you need it.  Your next appointment:   6 month(s)  The format for your next appointment:   Either In Person or Virtual  Provider:   You may see DR [facility]  or one of the following Advanced Practice Providers on your designated Care Team:    Kerin Ransom, PA-C  [place], [place]  Coletta Memos, [place]   Other Instructions  TRY TO EXERCISE [place]

## 2019-06-24 NOTE — Progress Notes (Signed)
Cardiology Office Note   Date:  06/24/2019   ID:  RENESSA Lyons, DOB [birth year], MRN [id number]  PCP:  Ashley Chard, MD  Cardiologist:   Ashley Latch, MD   No chief complaint on file.     History of Present Illness: Ashley Lyons is a 78 y.o. female with diabetes, hyperlipidemia, and GERD who is being seen today for the evaluation of bradycardia at the request of Ashley Chard, MD. She saw Dr. Baird Lyons on 04/2019 and was noted to be bradycardic.  Her heart rate was 43 bpm at that appointment.  EKG at that time revealed sinus bradycardia at 43 bpm and LAFB.  She has a history of bradycardia but this was the lowest recorded value.  Overall she has been feeling quite well.  She has no exertional chest pain or shortness of breath.  The only time she ever gets chest pain is when she is feeling stressed.  She has no lightheadedness or dizziness.  At times she gets mild edema at her ankles which she attributes to sitting for long periods of time.  She works as an English as a second language teacher.  She has no orthopnea or PND.  She does limit the salt in her diet.  She notes that lately she has not been exercising as much, which she attributes to the coronavirus.  She has started riding her stationary bike and feels good with exercise.  She started with 15 minutes and is trying to increase to 30 minutes.   Past Medical History:  Diagnosis Date  . Allergy   . Diabetes mellitus   . GERD (gastroesophageal reflux disease)   . Glaucoma    right eye  . Hyperlipidemia   . Leukopenia     Past Surgical History:  Procedure Laterality Date  . APPENDECTOMY    . COLONOSCOPY    . TONSILLECTOMY    . TUBAL LIGATION    . VAGINAL HYSTERECTOMY       Current Outpatient Medications  Medication Sig Dispense Refill  . Barberry-Oreg Grape-Goldenseal (BERBERINE COMPLEX PO) Take 500 mg by mouth.    Marland Kitchen BIOTIN PO Take by mouth.    Marland Kitchen CALCIUM PO Take by mouth.    . Cholecalciferol (VITAMIN D3) 50 MCG (2000 UT) capsule Take  2,000 Units by mouth daily.    Marland Kitchen co-enzyme Q-10 30 MG capsule Take 30 mg by mouth 3 (three) times daily.    . DiphenhydrAMINE HCl (BENADRYL ALLERGY PO) Take 25 mg by mouth as needed.    Marland Kitchen LECITHIN PO Take by mouth.    . Multiple Vitamin (MULTIVITAMIN) tablet Take 1 tablet by mouth daily.    Marland Kitchen omega-3 acid ethyl esters (LOVAZA) 1 g capsule Take by mouth 2 (two) times daily.    . pravastatin (PRAVACHOL) 40 MG tablet TAKE 1 TABLET BY MOUTH ONCE DAILY MONDAY  THROUGH  FRIDAY 90 tablet 2  . SPIRULINA PO Take by mouth.    . Timolol Maleate (ISTALOL) 0.5 % (DAILY) SOLN Apply to eye.    . vitamin C (ASCORBIC ACID) 500 MG tablet Take 500 mg by mouth daily.     No current facility-administered medications for this visit.     Allergies:   Almond oil, Bean pod extract, Corn-containing products, Peanuts [peanut oil], Sodium laureth sulfate, Soy allergy, and Strawberry extract    Social History:  The patient  reports that she has never smoked. She has never used smokeless tobacco. She reports current alcohol use. She reports that she does not  use drugs.   Family History:  The patient's family history includes Diabetes in her mother; Prostate Lyons in her brother and father.    ROS:  Please see the history of present illness.   Otherwise, review of systems are positive for none.   All other systems are reviewed and negative.    PHYSICAL EXAM: VS:  BP (!) 143/71   Pulse (!) 50   Temp (!) 96.9 F (36.1 C)   Ht 5\' 2"  (1.575 m)   Wt 145 lb (65.8 kg)   SpO2 100%   BMI 26.52 kg/m  , BMI Body mass index is 26.52 kg/m. GENERAL:  Well appearing HEENT:  Pupils equal round and reactive, fundi not visualized, oral mucosa unremarkable NECK:  No jugular venous distention, waveform within normal limits, carotid upstroke brisk and symmetric, no bruits LUNGS:  Clear to auscultation bilaterally HEART:  Bradycardic.  Regular rhythm.  PMI not displaced or sustained,S1 and S2 within normal limits, no S3, no S4,  no clicks, no rubs, no murmurs ABD:  Flat, positive bowel sounds normal in frequency in pitch, no bruits, no rebound, no guarding, no midline pulsatile mass, no hepatomegaly, no splenomegaly EXT:  2 plus pulses throughout, no edema, no cyanosis no clubbing SKIN:  No rashes no nodules NEURO:  Cranial nerves II through XII grossly intact, motor grossly intact throughout PSYCH:  Cognitively intact, oriented to person place and time   EKG:  EKG is not ordered today. 04/15/2019: Sinus bradycardia.  Rate 43 bpm.  LAFB.   Recent Labs: 04/15/2019: ALT 14; BUN 13; Creatinine, Ser 0.79; Hemoglobin 13.9; Platelets 268; Potassium 4.1; Sodium 140; TSH 2.290    Lipid Panel    Component Value Date/Time   CHOL 227 (H) 04/15/2019 1054   TRIG 65 04/15/2019 1054   HDL 73 04/15/2019 1054   CHOLHDL 3.1 04/15/2019 1054   LDLCALC 143 (H) 04/15/2019 1054      Wt Readings from Last 3 Encounters:  06/24/19 145 lb (65.8 kg)  04/15/19 147 lb 3.2 oz (66.8 kg)  04/15/19 147 lb 3.2 oz (66.8 kg)      ASSESSMENT AND PLAN:  # Bradycardia:  She is asymptomatic.  QRS is narrow on EKG.  No nodal agents but no indication for pacemaker.  # Hyperlipidemia:  Lipids are poorly controlled.  LDL should be at least <100.  She notes that her diet has been poor lately and she is not exercising.  She is going to work on increasing this to 150 minutes weekly.  She is taking pravastatin every other day due to myalgias.  She is going to take it 20 mg daily instead.  Repeat lipids and CMP in 6 months.   Current medicines are reviewed at length with the patient today.  The patient does not have concerns regarding medicines.  The following changes have been made:  Pravastatin 20mg  daily  Labs/ tests ordered today include:  No orders of the defined types were placed in this encounter.    Disposition:   FU with Bacilio Abascal C. Oval Linsey, MD, [facility] in 6 months.       Signed, Daryan Cagley C. Oval Linsey, [place], [facility]  06/24/2019 10:24 AM     [place]

## 2019-08-20 ENCOUNTER — Encounter: Payer: Self-pay | Admitting: Internal Medicine

## 2019-08-20 ENCOUNTER — Ambulatory Visit (INDEPENDENT_AMBULATORY_CARE_PROVIDER_SITE_OTHER): Payer: Medicare Other | Admitting: Internal Medicine

## 2019-08-20 ENCOUNTER — Other Ambulatory Visit: Payer: Self-pay

## 2019-08-20 VITALS — BP 136/74 | HR 45 | Temp 98.7°F | Ht 62.0 in | Wt 148.4 lb

## 2019-08-20 DIAGNOSIS — E1122 Type 2 diabetes mellitus with diabetic chronic kidney disease: Secondary | ICD-10-CM | POA: Diagnosis not present

## 2019-08-20 DIAGNOSIS — D709 Neutropenia, unspecified: Secondary | ICD-10-CM | POA: Diagnosis not present

## 2019-08-20 DIAGNOSIS — Z6827 Body mass index (BMI) 27.0-27.9, adult: Secondary | ICD-10-CM

## 2019-08-20 DIAGNOSIS — N182 Chronic kidney disease, stage 2 (mild): Secondary | ICD-10-CM | POA: Diagnosis not present

## 2019-08-20 DIAGNOSIS — R001 Bradycardia, unspecified: Secondary | ICD-10-CM

## 2019-08-20 DIAGNOSIS — E663 Overweight: Secondary | ICD-10-CM

## 2019-08-20 NOTE — Patient Instructions (Signed)
COVID-19 Vaccine Information can be found at: [email or web address].uk For questions related to vaccine distribution or appointments, please email [email or web address] or call [phone].    Exercising to Stay Healthy To become healthy and stay healthy, it is recommended that you do moderate-intensity and vigorous-intensity exercise. You can tell that you are exercising at a moderate intensity if your heart starts beating faster and you start breathing faster but can still hold a conversation. You can tell that you are exercising at a vigorous intensity if you are breathing much harder and faster and cannot hold a conversation while exercising. Exercising regularly is important. It has many health benefits, such as:  Improving overall fitness, flexibility, and endurance.  Increasing bone density.  Helping with weight control.  Decreasing body fat.  Increasing muscle strength.  Reducing stress and tension.  Improving overall health. How often should I exercise? Choose an activity that you enjoy, and set realistic goals. Your health care provider can help you make an activity plan that works for you. Exercise regularly as told by your health care provider. This may include:  Doing strength training two times a week, such as: ? Lifting weights. ? Using resistance bands. ? Push-ups. ? Sit-ups. ? Yoga.  Doing a certain intensity of exercise for a given amount of time. Choose from these options: ? A total of 150 minutes of moderate-intensity exercise every week. ? A total of 75 minutes of vigorous-intensity exercise every week. ? A mix of moderate-intensity and vigorous-intensity exercise every week. Children, pregnant women, people who have not exercised regularly, people who are overweight, and older adults may need to talk with a health care provider about what activities are safe to do. If you have a medical condition, be sure  to talk with your health care provider before you start a new exercise program. What are some exercise ideas? Moderate-intensity exercise ideas include:  Walking 1 mile (1.6 km) in about 15 minutes.  Biking.  Hiking.  Golfing.  Dancing.  Water aerobics. Vigorous-intensity exercise ideas include:  Walking 4.5 miles (7.2 km) or more in about 1 hour.  Jogging or running 5 miles (8 km) in about 1 hour.  Biking 10 miles (16.1 km) or more in about 1 hour.  Lap swimming.  Roller-skating or in-line skating.  Cross-country skiing.  Vigorous competitive sports, such as football, basketball, and soccer.  Jumping rope.  Aerobic dancing. What are some everyday activities that can help me to get exercise?  [place] work, such as: ? Pushing a Conservation officer, nature. ? Raking and bagging leaves.  Washing your car.  Pushing a stroller.  Shoveling snow.  Gardening.  Washing windows or floors. How can I be more active in my day-to-day activities?  Use stairs instead of an elevator.  Take a walk during your lunch break.  If you drive, park your car farther away from your work or school.  If you take public transportation, get off one stop early and walk the rest of the way.  Stand up or walk around during all of your indoor phone calls.  Get up, stretch, and walk around every 30 minutes throughout the day.  Enjoy exercise with a friend. Support to continue exercising will help you keep a regular routine of activity. What guidelines can I follow while exercising?  Before you start a new exercise program, talk with your health care provider.  Do not exercise so much that you hurt yourself, feel dizzy, or get very short of breath.  Wear comfortable clothes and  wear shoes with good support.  Drink plenty of water while you exercise to prevent dehydration or heat stroke.  Work out until your breathing and your heartbeat get faster. Where to find more information  [place] Department  of Health and Human Services: BondedCompany.at  Centers for Disease Control and Prevention (CDC): http://www.wolf.info/ Summary  Exercising regularly is important. It will improve your overall fitness, flexibility, and endurance.  Regular exercise also will improve your overall health. It can help you control your weight, reduce stress, and improve your bone density.  Do not exercise so much that you hurt yourself, feel dizzy, or get very short of breath.  Before you start a new exercise program, talk with your health care provider. This information is not intended to replace advice given to you by your health care provider. Make sure you discuss any questions you have with your health care provider. Document Revised: 07/05/2017 Document Reviewed: 06/13/2017 Elsevier Patient Education  2020 [employer].

## 2019-08-20 NOTE — Progress Notes (Signed)
This visit occurred during the SARS-CoV-2 public health emergency.  Safety protocols were in place, including screening questions prior to the visit, additional usage of staff PPE, and extensive cleaning of exam room while observing appropriate contact time as indicated for disinfecting solutions.  Subjective:     Patient ID: Ashley Lyons , female    DOB: 20-Jul-1941 , 79 y.o.   MRN: [id number]   Chief Complaint  Patient presents with  . Diabetes    HPI  Diabetes She presents for her follow-up diabetic visit. She has type 2 diabetes mellitus. Her disease course has been stable. There are no hypoglycemic associated symptoms. There are no hypoglycemic complications. Risk factors for coronary artery disease include dyslipidemia, diabetes mellitus, sedentary lifestyle and post-menopausal. She is following a generally healthy diet. She participates in exercise intermittently. There is no change in her home blood glucose trend. Her breakfast blood glucose is taken between 7-8 am. Her breakfast blood glucose range is generally 90-110 mg/dl. An ACE inhibitor/angiotensin II receptor blocker is not being taken. Eye exam is current.     Past Medical History:  Diagnosis Date  . Allergy   . Diabetes mellitus   . GERD (gastroesophageal reflux disease)   . Glaucoma    right eye  . Hyperlipidemia   . Leukopenia      Family History  Problem Relation Age of Onset  . Prostate cancer Father   . Diabetes Mother   . Prostate cancer Brother   . Kidney disease Sister   . Liver disease Brother   . Colon cancer Brother   . Esophageal cancer Neg Hx   . Rectal cancer Neg Hx   . Stomach cancer Neg Hx      Current Outpatient Medications:  .  Barberry-Oreg Grape-Goldenseal (BERBERINE COMPLEX PO), Take 500 mg by mouth., Disp: , Rfl:  .  BIOTIN PO, Take by mouth., Disp: , Rfl:  .  CALCIUM PO, Take by mouth., Disp: , Rfl:  .  Cholecalciferol (VITAMIN D3) 50 MCG (2000 UT) capsule, Take 2,000 Units by  mouth daily., Disp: , Rfl:  .  co-enzyme Q-10 30 MG capsule, Take 30 mg by mouth daily. , Disp: , Rfl:  .  DiphenhydrAMINE HCl (BENADRYL ALLERGY PO), Take 25 mg by mouth as needed., Disp: , Rfl:  .  Multiple Vitamin (MULTIVITAMIN) tablet, Take 1 tablet by mouth daily., Disp: , Rfl:  .  omega-3 acid ethyl esters (LOVAZA) 1 g capsule, Take by mouth 2 (two) times daily., Disp: , Rfl:  .  pravastatin (PRAVACHOL) 20 MG tablet, Take 1 tablet (20 mg total) by mouth daily. (Patient taking differently: Take 20 mg by mouth daily. 1/2 tablet daily), Disp: 90 tablet, Rfl: 3 .  SPIRULINA PO, Take by mouth., Disp: , Rfl:  .  Timolol Maleate (ISTALOL) 0.5 % (DAILY) SOLN, Apply to eye., Disp: , Rfl:  .  vitamin C (ASCORBIC ACID) 500 MG tablet, Take 500 mg by mouth daily., Disp: , Rfl:  .  LECITHIN PO, Take by mouth., Disp: , Rfl:    Allergies  Allergen Reactions  . Almond Oil Hives    Almonds  . Bean Pod Extract Hives    String Beans  . Corn-Containing Products Hives  . Peanuts [Peanut Oil] Hives  . Sodium Laureth Sulfate Hives  . Soy Allergy Hives  . Strawberry Extract Hives     Review of Systems  Constitutional: Negative.   Respiratory: Negative.   Cardiovascular: Negative.   Gastrointestinal: Negative.  Neurological: Negative.   Psychiatric/Behavioral: Negative.      Today's Vitals   08/20/19 0847  BP: 136/74  Pulse: (!) 45  Temp: 98.7 F (37.1 C)  TempSrc: Oral  Weight: 148 lb 6.4 oz (67.3 kg)  Height: 5' 2"  (1.575 m)  PainSc: 0-No pain   Body mass index is 27.14 kg/m.   Objective:  Physical Exam Vitals and nursing note reviewed.  Constitutional:      Appearance: Normal appearance.  HENT:     Head: Normocephalic and atraumatic.  Cardiovascular:     Rate and Rhythm: Normal rate and regular rhythm.     Heart sounds: Normal heart sounds.  Pulmonary:     Effort: Pulmonary effort is normal.     Breath sounds: Normal breath sounds.  Skin:    General: Skin is warm.   Neurological:     General: No focal deficit present.     Mental Status: She is alert.  Psychiatric:        Mood and Affect: Mood normal.        Behavior: Behavior normal.         Assessment And Plan:     1. Type 2 diabetes mellitus with stage 2 chronic kidney disease, without long-term current use of insulin (HCC)  Chronic, yet stable. She has been doing well without any medications, only diet/exercise. I will check labs as listed below.    - CMP14+EGFR - Hemoglobin A1c  2. Neutropenia, unspecified type (HCC)  Chronic, yet stable. Most recent CBC from Sept 2020  is within normal limits.   3. Bradycardia  Chronic, she has been asymptomatic. She has been evaluated by Cardiology as well.    4. Overweight with body mass index (BMI) of 27 to 27.9 in adult  Her BMI is stable for her age group. She is encouraged to participate in exercise at least 150 minutes per week.   [name], MD    THE PATIENT IS ENCOURAGED TO PRACTICE SOCIAL DISTANCING DUE TO THE COVID-19 PANDEMIC.

## 2019-08-21 LAB — CMP14+EGFR
ALT: 11 IU/L (ref 0–32)
AST: 19 IU/L (ref 0–40)
Albumin/Globulin Ratio: 1.2 (ref 1.2–2.2)
Albumin: 3.9 g/dL (ref 3.7–4.7)
Alkaline Phosphatase: 58 IU/L (ref 39–117)
BUN/Creatinine Ratio: 13 (ref 12–28)
BUN: 11 mg/dL (ref 8–27)
Bilirubin Total: 0.5 mg/dL (ref 0.0–1.2)
CO2: 22 mmol/L (ref 20–29)
Calcium: 9.4 mg/dL (ref 8.7–10.3)
Chloride: 104 mmol/L (ref 96–106)
Creatinine, Ser: 0.82 mg/dL (ref 0.57–1.00)
GFR calc Af Amer: 78 mL/min/{1.73_m2} (ref >59)
GFR calc non Af Amer: 68 mL/min/{1.73_m2} (ref >59)
Globulin, Total: 3.2 g/dL (ref 1.5–4.5)
Glucose: 114 mg/dL — ABNORMAL HIGH (ref 65–99)
Potassium: 4.5 mmol/L (ref 3.5–5.2)
Sodium: 140 mmol/L (ref 134–144)
Total Protein: 7.1 g/dL (ref 6.0–8.5)

## 2019-08-21 LAB — HEMOGLOBIN A1C
Est. average glucose Bld gHb Est-mCnc: 134 mg/dL
Hgb A1c MFr Bld: 6.3 % — ABNORMAL HIGH (ref 4.8–5.6)

## 2019-09-29 ENCOUNTER — Encounter: Payer: Self-pay | Admitting: Internal Medicine

## 2019-09-29 ENCOUNTER — Ambulatory Visit (INDEPENDENT_AMBULATORY_CARE_PROVIDER_SITE_OTHER): Payer: Medicare Other | Admitting: Internal Medicine

## 2019-09-29 ENCOUNTER — Other Ambulatory Visit: Payer: Self-pay

## 2019-09-29 VITALS — BP 124/76 | HR 85 | Temp 98.0°F | Ht 62.0 in | Wt 147.2 lb

## 2019-09-29 DIAGNOSIS — L2084 Intrinsic (allergic) eczema: Secondary | ICD-10-CM

## 2019-09-29 MED ORDER — TRIAMCINOLONE ACETONIDE 40 MG/ML IJ SUSP
60.0000 mg | Freq: Once | INTRAMUSCULAR | Status: AC
  Administered 2019-09-29: 60 mg via INTRAMUSCULAR

## 2019-09-29 MED ORDER — PREDNISONE 10 MG (21) PO TBPK: ORAL_TABLET | ORAL | 0 refills | Status: DC

## 2019-10-03 NOTE — Progress Notes (Signed)
This visit occurred during the SARS-CoV-2 public health emergency.  Safety protocols were in place, including screening questions prior to the visit, additional usage of staff PPE, and extensive cleaning of exam room while observing appropriate contact time as indicated for disinfecting solutions.  Subjective:     Patient ID: Ashley Lyons , female    DOB: 1940/09/09 , 79 y.o.   MRN: [id number]   Chief Complaint  Patient presents with  . Rash    HPI  She is here today for further evaluation of an itchy rash on her face, neck, hands and forearms. She reports that she had been drinking bone broth - found out one of them has tomato sauce in it. She has allergy to tomatoes. No relief with Benadryl- rash appears to be spreading. She has had similar sx in the past with other foods that she is allergic to. Has been seen by Derm in the past for similar sx.   Rash This is a new problem. The current episode started in the past 7 days. The affected locations include the scalp, left hand and right hand.     Past Medical History:  Diagnosis Date  . Allergy   . Diabetes mellitus   . GERD (gastroesophageal reflux disease)   . Glaucoma    right eye  . Hyperlipidemia   . Leukopenia      Family History  Problem Relation Age of Onset  . Prostate cancer Father   . Diabetes Mother   . Prostate cancer Brother   . Kidney disease Sister   . Liver disease Brother   . Colon cancer Brother   . Esophageal cancer Neg Hx   . Rectal cancer Neg Hx   . Stomach cancer Neg Hx      Current Outpatient Medications:  .  Barberry-Oreg Grape-Goldenseal (BERBERINE COMPLEX PO), Take 500 mg by mouth., Disp: , Rfl:  .  BIOTIN PO, Take 100 mg by mouth. , Disp: , Rfl:  .  CALCIUM PO, Take 1,000 mg by mouth. , Disp: , Rfl:  .  Cholecalciferol (VITAMIN D3) 50 MCG (2000 UT) capsule, Take 2,000 Units by mouth daily., Disp: , Rfl:  .  co-enzyme Q-10 30 MG capsule, Take 30 mg by mouth daily. , Disp: , Rfl:  .   DiphenhydrAMINE HCl (BENADRYL ALLERGY PO), Take 25 mg by mouth as needed., Disp: , Rfl:  .  Multiple Vitamin (MULTIVITAMIN) tablet, Take 1 tablet by mouth daily., Disp: , Rfl:  .  omega-3 acid ethyl esters (LOVAZA) 1 g capsule, Take by mouth daily. , Disp: , Rfl:  .  pravastatin (PRAVACHOL) 20 MG tablet, Take 1 tablet (20 mg total) by mouth daily. (Patient taking differently: Take 20 mg by mouth daily. 1/2 tablet daily), Disp: 90 tablet, Rfl: 3 .  SPIRULINA PO, Take by mouth. As needed, Disp: , Rfl:  .  Timolol Maleate (ISTALOL) 0.5 % (DAILY) SOLN, Apply to eye., Disp: , Rfl:  .  vitamin C (ASCORBIC ACID) 500 MG tablet, Take 500 mg by mouth daily., Disp: , Rfl:  .  predniSONE (STERAPRED UNI-PAK 21 TAB) 10 MG (21) TBPK tablet, Use as directed, dispense as six day dose pack, Disp: 21 tablet, Rfl: 0   Allergies  Allergen Reactions  . Almond Oil Hives    Almonds  . Bean Pod Extract Hives    String Beans  . Corn-Containing Products Hives  . Peanuts [Peanut Oil] Hives  . Sodium Laureth Sulfate Hives  . Soy Allergy Hives  .  Strawberry Extract Hives     Review of Systems  Constitutional: Negative.   Respiratory: Negative.   Cardiovascular: Negative.   Gastrointestinal: Negative.   Skin: Positive for rash.       She adds her face is itching  Neurological: Negative.   Psychiatric/Behavioral: Negative.      Today's Vitals   09/29/19 1424  BP: 124/76  Pulse: 85  Temp: 98 F (36.7 C)  Weight: 147 lb 3.2 oz (66.8 kg)  Height: 5\' 2"  (1.575 m)  PainSc: 0-No pain   Body mass index is 26.92 kg/m.   Objective:  Physical Exam Vitals and nursing note reviewed.  Constitutional:      Appearance: Normal appearance.  HENT:     Head: Normocephalic and atraumatic.  Cardiovascular:     Rate and Rhythm: Normal rate and regular rhythm.     Heart sounds: Normal heart sounds.  Pulmonary:     Effort: Pulmonary effort is normal.     Breath sounds: Normal breath sounds.  Skin:    General:  Skin is warm.     Comments: Hyperpigmented, erythematous rash on face, neck and hands. No vesicular lesions noted.   Neurological:     General: No focal deficit present.     Mental Status: She is alert.  Psychiatric:        Mood and Affect: Mood normal.        Behavior: Behavior normal.         Assessment And Plan:     1. Intrinsic atopic dermatitis  She was given Kenalog, 60mg  IM x1. She was also given prednisone, 10mg  six day dose pack. She does not wish to have Derm referral at this time. She will avoid tomato products in the future.   - triamcinolone acetonide (KENALOG-40) injection 60 mg        [name], MD    THE PATIENT IS ENCOURAGED TO PRACTICE SOCIAL DISTANCING DUE TO THE COVID-19 PANDEMIC.

## 2019-10-17 ENCOUNTER — Ambulatory Visit: Payer: Medicare Other | Attending: Internal Medicine

## 2019-10-17 DIAGNOSIS — Z23 Encounter for immunization: Secondary | ICD-10-CM

## 2019-10-17 NOTE — Progress Notes (Signed)
   Covid-19 Vaccination Clinic  Name:  Ashley Lyons    MRN: [id number] DOB: 1941-07-07  10/17/2019  [name] was observed post Covid-19 immunization for 15 minutes without incident. She was provided with Vaccine Information Sheet and instruction to access the V-Safe system.   [name] was instructed to call 911 with any severe reactions post vaccine: Marland Kitchen Difficulty breathing  . Swelling of face and throat  . A fast heartbeat  . A bad rash all over body  . Dizziness and weakness   Immunizations Administered    Name Date Dose VIS Date Route   Pfizer COVID-19 Vaccine 10/17/2019  2:32 PM 0.3 mL 07/17/2019 Intramuscular   Manufacturer: [place]   Lot: [id number]   [place]: [id number]

## 2019-10-19 DIAGNOSIS — L2084 Intrinsic (allergic) eczema: Secondary | ICD-10-CM | POA: Diagnosis not present

## 2019-10-19 DIAGNOSIS — L299 Pruritus, unspecified: Secondary | ICD-10-CM | POA: Diagnosis not present

## 2019-11-10 ENCOUNTER — Ambulatory Visit: Payer: Medicare Other | Attending: Internal Medicine

## 2019-11-10 DIAGNOSIS — Z23 Encounter for immunization: Secondary | ICD-10-CM

## 2019-11-10 NOTE — Progress Notes (Signed)
   Covid-19 Vaccination Clinic  Name:  ANAISHA ROARK    MRN: [id number] DOB: [birth year]  11/10/2019  Ms. Salata was observed post Covid-19 immunization for 15 minutes without incident. She was provided with Vaccine Information Sheet and instruction to access the V-Safe system.   Ms. Daube was instructed to call 911 with any severe reactions post vaccine: Marland Kitchen Difficulty breathing  . Swelling of face and throat  . A fast heartbeat  . A bad rash all over body  . Dizziness and weakness   Immunizations Administered    Name Date Dose VIS Date Route   Pfizer COVID-19 Vaccine 11/10/2019  4:00 PM 0.3 mL 07/17/2019 Intramuscular   Manufacturer: [place]   Lot: [id number]   [place]: [id number]

## 2019-12-06 NOTE — Progress Notes (Signed)
Cardiology Clinic Note   Patient Name: Ashley Lyons Date of Encounter: 12/07/2019  Primary Care Provider:  Glendale Chard, MD Primary Cardiologist:  Skeet Latch, MD  Patient Profile    Ashley Lyons 79 year old female presents today for follow-up evaluation of her HLD, and bradycardia.  Past Medical History    Past Medical History:  Diagnosis Date  . Allergy   . Diabetes mellitus   . GERD (gastroesophageal reflux disease)   . Glaucoma    right eye  . Hyperlipidemia   . Leukopenia    Past Surgical History:  Procedure Laterality Date  . APPENDECTOMY    . COLONOSCOPY    . TONSILLECTOMY    . TUBAL LIGATION    . VAGINAL HYSTERECTOMY      Allergies  Allergies  Allergen Reactions  . Almond Oil Hives    Almonds  . Bean Pod Extract Hives    String Beans  . Corn-Containing Products Hives  . Peanuts [Peanut Oil] Hives  . Sodium Laureth Sulfate Hives  . Soy Allergy Hives  . Strawberry Extract Hives    History of Present Illness    Ashley Lyons has a PMH of type II but diabetes, CKD stage II, pure hypercholesterolemia, bradycardia, GERD, and neutropenia.  She was initially seen by Dr. Oval Linsey on 06/24/2019 for an evaluation of her bradycardia.  She had been referred by Dr. Bryon Lions.  She was seen by Dr. Baird Cancer on 04/2019 and was noted to be bradycardic.  Her heart rate at that time was 43.  She had a previous history of bradycardia however her heart rate had not been that low.  During her evaluation she felt well.  She denied exertional chest pain, lightheadedness, dizziness, and shortness of breath.  She did indicate that she occasionally had chest pain with increased stress.  She also indicated that she has mild edema in her ankles from long periods of sitting.  She denied orthopnea and PND.  She indicated that she had been limiting her salt in her diet.  She was also staying physically active riding a stationary bike and felt well with exercising.  At that  time she was trying to increase her amount of time on her stationary bike to 30 minutes.  She was not on any nodal agents and was asymptomatic so, continued monitoring was recommended.  Her LDL was not at goal.  She had been taking her pravastatin every other day due to myalgia.  Her prescription was changed to pravastatin daily and she plans to increase her physical activity to 150 minutes weekly.  She presents the clinic today for further evaluation and states she feels well.  She did have a period of 3 weeks where she had an allergic reaction.  She was trying to improve her diet by using bone broth in preparation for taking the COVID-19 injection.  She found out that bone broth had a gradient of tomatoes that she was allergic to.  It took about 3 weeks for her to get over her rash.  She has not had any other health issues/concerns.  She remains physically active and eats a high-fiber diet.  I will have her follow-up with Dr. Oval Linsey in 6 months.  Today she denies chest pain, shortness of breath, lower extremity edema, fatigue, palpitations, melena, hematuria, hemoptysis, diaphoresis, weakness, presyncope, syncope, orthopnea, and PND.   Home Medications    Prior to Admission medications   Medication Sig Start Date End Date Taking? Authorizing Provider  Barberry-Oreg  Grape-Goldenseal (BERBERINE COMPLEX PO) Take 500 mg by mouth.    [provider]  BIOTIN PO Take 100 mg by mouth.     [provider]  CALCIUM PO Take 1,000 mg by mouth.     [provider]  Cholecalciferol (VITAMIN D3) 50 MCG (2000 UT) capsule Take 2,000 Units by mouth daily.    [provider]  co-enzyme Q-10 30 MG capsule Take 30 mg by mouth daily.     [provider]  DiphenhydrAMINE HCl (BENADRYL ALLERGY PO) Take 25 mg by mouth as needed.    [provider]  Multiple Vitamin (MULTIVITAMIN) tablet Take 1 tablet by mouth daily.    [provider]  omega-3 acid ethyl  esters (LOVAZA) 1 g capsule Take by mouth daily.     [provider]  pravastatin (PRAVACHOL) 20 MG tablet Take 1 tablet (20 mg total) by mouth daily. Patient taking differently: Take 20 mg by mouth daily. 1/2 tablet daily 06/24/19   Skeet Latch, MD  predniSONE (STERAPRED UNI-PAK 21 TAB) 10 MG (21) TBPK tablet Use as directed, dispense as six day dose pack 09/29/19   Glendale Chard, MD  SPIRULINA PO Take by mouth. As needed    [provider]  Timolol Maleate (ISTALOL) 0.5 % (DAILY) SOLN Apply to eye.    [provider]  vitamin C (ASCORBIC ACID) 500 MG tablet Take 500 mg by mouth daily.    [provider]    Family History    Family History  Problem Relation Age of Onset  . Prostate cancer Father   . Diabetes Mother   . Prostate cancer Brother   . Kidney disease Sister   . Liver disease Brother   . Colon cancer Brother   . Esophageal cancer Neg Hx   . Rectal cancer Neg Hx   . Stomach cancer Neg Hx    She indicated that her mother is deceased. She indicated that her father is deceased. She indicated that her sister is deceased. She indicated that both of her brothers are deceased. She indicated that the status of her neg hx is unknown.  Social History    Social History   Socioeconomic History  . Marital status: Married    Spouse name: Not on file  . Number of children: Not on file  . Years of education: Not on file  . Highest education level: Not on file  Occupational History  . Occupation: retired  Tobacco Use  . Smoking status: Never Smoker  . Smokeless tobacco: Never Used  Substance and Sexual Activity  . Alcohol use: Yes    Comment: wine on occasion  . Drug use: No  . Sexual activity: Not Currently  Other Topics Concern  . Not on file  Social History Narrative  . Not on file   Social Determinants of Health   Financial Resource Strain: Low Risk   . Difficulty of Paying Living Expenses: Not hard at all  Food Insecurity:  No Food Insecurity  . Worried About Charity fundraiser in the Last Year: Never true  . Ran Out of Food in the Last Year: Never true  Transportation Needs: No Transportation Needs  . Lack of Transportation (Medical): No  . Lack of Transportation (Non-Medical): No  Physical Activity: Inactive  . Days of Exercise per Week: 0 days  . Minutes of Exercise per Session: 0 min  Stress: No Stress Concern Present  . Feeling of Stress : Only a little  Social Connections:   . Frequency of Communication with Friends and Family:   . Frequency of Social Gatherings with Friends and Family:   . Attends Religious Services:   . Active Member of Clubs or Organizations:   . Attends Archivist Meetings:   Marland Kitchen Marital Status:   Intimate Partner Violence: Not At Risk  . Fear of Current or Ex-Partner: No  . Emotionally Abused: No  . Physically Abused: No  . Sexually Abused: No     Review of Systems    General:  No chills, fever, night sweats or weight changes.  Cardiovascular:  No chest pain, dyspnea on exertion, edema, orthopnea, palpitations, paroxysmal nocturnal dyspnea. Dermatological: No rash, lesions/masses Respiratory: No cough, dyspnea Urologic: No hematuria, dysuria Abdominal:   No nausea, vomiting, diarrhea, bright red blood per rectum, melena, or hematemesis Neurologic:  No visual changes, wkns, changes in mental status. All other systems reviewed and are otherwise negative except as noted above.  Physical Exam    VS:  BP 124/70   Pulse (!) 48   Temp (!) 97 F (36.1 C)   Ht 5\' 3"  (1.6 m)   Wt 144 lb 9.6 oz (65.6 kg)   SpO2 97%   BMI 25.61 kg/m  , BMI Body mass index is 25.61 kg/m. GEN: Well nourished, well developed, in no acute distress. HEENT: normal. Neck: Supple, no JVD, carotid bruits, or masses. Cardiac: RRR, no murmurs, rubs, or gallops. No clubbing, cyanosis, edema.  Radials/DP/PT 2+ and equal bilaterally.  Respiratory:  Respirations regular and unlabored, clear  to auscultation bilaterally. GI: Soft, nontender, nondistended, BS + x 4. MS: no deformity or atrophy. Skin: warm and dry, no rash. Neuro:  Strength and sensation are intact. Psych: Normal affect.  Accessory Clinical Findings    ECG personally reviewed by me today-sinus bradycardia left anterior fascicular block anterior infarct undetermined age 31 bpm- No acute changes  EKG 04/15/2019 Sinus bradycardia LAFB 43 bpm  Assessment & Plan   1.  Bradycardia-EKG today shows sinus bradycardia left anterior fascicular block cannot rule out anterior infarct undetermined age 49 bpm.  She remains asymptomatic. Heart healthy low-sodium diet-salty 6 given Increase physical activity as tolerated-goal 150 minutes of moderate physical activity weekly Continue to monitor  Hyperlipidemia-goal less than 100 LDL.  Previously had not been eating a heart healthy diet and was planning to increase her physical activity. Continue pravastatin 20 mg daily Increase physical activity as tolerated Heart healthy low-sodium high-fiber diet Followed by PCP  Disposition: Follow-up with Dr. Oval Linsey in 6 months.    Jossie Ng. Zarriah Starkel NP-C    12/07/2019, 11:25 AM [place] Office [phone] Fax [phone]

## 2019-12-07 ENCOUNTER — Encounter: Payer: Self-pay | Admitting: General Practice

## 2019-12-07 ENCOUNTER — Other Ambulatory Visit: Payer: Self-pay

## 2019-12-07 ENCOUNTER — Ambulatory Visit: Payer: Medicare Other | Admitting: General Practice

## 2019-12-07 VITALS — BP 124/70 | HR 48 | Temp 97.0°F | Ht 63.0 in | Wt 144.6 lb

## 2019-12-07 DIAGNOSIS — E7800 Pure hypercholesterolemia, unspecified: Secondary | ICD-10-CM

## 2019-12-07 DIAGNOSIS — R001 Bradycardia, unspecified: Secondary | ICD-10-CM | POA: Diagnosis not present

## 2019-12-07 NOTE — Patient Instructions (Signed)
Medication Instructions:  Your physician recommends that you continue on your current medications as directed. Please refer to the Current Medication list given to you today.  *If you need a refill on your cardiac medications before your next appointment, please call your pharmacy*  Follow-Up: At [facility], you and your health needs are our priority.  As part of our continuing mission to provide you with exceptional heart care, we have created designated Provider Care Teams.  These Care Teams include your primary Cardiologist (physician) and Advanced Practice Providers (APPs -  Physician Assistants and Nurse Practitioners) who all work together to provide you with the care you need, when you need it.  We recommend signing up for the patient portal called "MyChart".  Sign up information is provided on this After Visit Summary.  MyChart is used to connect with patients for Virtual Visits (Telemedicine).  Patients are able to view lab/test results, encounter notes, upcoming appointments, etc.  Non-urgent messages can be sent to your provider as well.   To learn more about what you can do with MyChart, go to [email or web address].    Your next appointment:   6 month(s)  The format for your next appointment:   In Person  Provider:   Skeet Latch, MD   Other Instructions

## 2019-12-21 ENCOUNTER — Ambulatory Visit: Payer: Self-pay | Admitting: Internal Medicine

## 2019-12-22 ENCOUNTER — Other Ambulatory Visit: Payer: Self-pay

## 2019-12-22 ENCOUNTER — Encounter: Payer: Self-pay | Admitting: Internal Medicine

## 2019-12-22 ENCOUNTER — Ambulatory Visit (INDEPENDENT_AMBULATORY_CARE_PROVIDER_SITE_OTHER): Payer: Medicare Other | Admitting: Internal Medicine

## 2019-12-22 VITALS — BP 114/72 | HR 46 | Temp 97.6°F | Ht 63.0 in | Wt 143.8 lb

## 2019-12-22 DIAGNOSIS — E7800 Pure hypercholesterolemia, unspecified: Secondary | ICD-10-CM

## 2019-12-22 DIAGNOSIS — E663 Overweight: Secondary | ICD-10-CM | POA: Diagnosis not present

## 2019-12-22 DIAGNOSIS — E1122 Type 2 diabetes mellitus with diabetic chronic kidney disease: Secondary | ICD-10-CM

## 2019-12-22 DIAGNOSIS — Z23 Encounter for immunization: Secondary | ICD-10-CM

## 2019-12-22 DIAGNOSIS — N182 Chronic kidney disease, stage 2 (mild): Secondary | ICD-10-CM | POA: Diagnosis not present

## 2019-12-22 DIAGNOSIS — Z6825 Body mass index (BMI) 25.0-25.9, adult: Secondary | ICD-10-CM

## 2019-12-22 MED ORDER — SHINGRIX 50 MCG/0.5ML IM SUSR: 0.5000 mL | Freq: Once | INTRAMUSCULAR | 0 refills | Status: AC

## 2019-12-22 NOTE — Progress Notes (Signed)
This visit occurred during the SARS-CoV-2 public health emergency.  Safety protocols were in place, including screening questions prior to the visit, additional usage of staff PPE, and extensive cleaning of exam room while observing appropriate contact time as indicated for disinfecting solutions.  Subjective:     Patient ID: Ashley Lyons , female    DOB: [birth year] , 79 y.o.   MRN: [id number]   Chief Complaint  Patient presents with  . Diabetes    HPI  She presents today for diabetes check. She reports that she has been eating more sweets than usual and not exercising as much. She has been busy tending to home and work needs.   Diabetes She presents for her follow-up diabetic visit. She has type 2 diabetes mellitus. Her disease course has been stable. There are no hypoglycemic associated symptoms. There are no hypoglycemic complications. Risk factors for coronary artery disease include dyslipidemia, diabetes mellitus, sedentary lifestyle and post-menopausal. She is following a generally healthy diet. She participates in exercise intermittently. There is no change in her home blood glucose trend. Her breakfast blood glucose is taken between 7-8 am. Her breakfast blood glucose range is generally 90-110 mg/dl. An ACE inhibitor/angiotensin II receptor blocker is not being taken. Eye exam is current.     Past Medical History:  Diagnosis Date  . Allergy   . Diabetes mellitus   . GERD (gastroesophageal reflux disease)   . Glaucoma    right eye  . Hyperlipidemia   . Leukopenia      Family History  Problem Relation Age of Onset  . Prostate cancer Father   . Diabetes Mother   . Prostate cancer Brother   . Kidney disease Sister   . Liver disease Brother   . Colon cancer Brother   . Esophageal cancer Neg Hx   . Rectal cancer Neg Hx   . Stomach cancer Neg Hx      Current Outpatient Medications:  .  Barberry-Oreg Grape-Goldenseal (BERBERINE COMPLEX PO), Take 500 mg by mouth., Disp:  , Rfl:  .  BIOTIN PO, Take 100 mg by mouth. , Disp: , Rfl:  .  CALCIUM PO, Take 1,000 mg by mouth. , Disp: , Rfl:  .  Cholecalciferol (VITAMIN D3) 50 MCG (2000 UT) capsule, Take 2,000 Units by mouth daily., Disp: , Rfl:  .  co-enzyme Q-10 30 MG capsule, Take 30 mg by mouth daily. , Disp: , Rfl:  .  DiphenhydrAMINE HCl (BENADRYL ALLERGY PO), Take 25 mg by mouth as needed., Disp: , Rfl:  .  hydrocortisone 2.5 % cream, Apply to face twice daily for 2 wks, then daily for 2 wks, then daily as needed for itching, Disp: , Rfl:  .  Multiple Vitamin (MULTIVITAMIN) tablet, Take 1 tablet by mouth daily., Disp: , Rfl:  .  omega-3 acid ethyl esters (LOVAZA) 1 g capsule, Take by mouth daily. , Disp: , Rfl:  .  pravastatin (PRAVACHOL) 20 MG tablet, Take 1 tablet (20 mg total) by mouth daily. (Patient taking differently: Take 20 mg by mouth daily. 1/2 tablet daily), Disp: 90 tablet, Rfl: 3 .  SPIRULINA PO, Take by mouth. As needed, Disp: , Rfl:  .  timolol (TIMOPTIC) 0.5 % ophthalmic solution, , Disp: , Rfl:  .  vitamin C (ASCORBIC ACID) 500 MG tablet, Take 500 mg by mouth daily., Disp: , Rfl:  .  Zoster Vaccine Adjuvanted [facility]) injection, Inject 0.5 mLs into the muscle once for 1 dose., Disp: 0.5 mL, Rfl: 0  Allergies  Allergen Reactions  . Almond Oil Hives    Almonds  . Bean Pod Extract Hives    String Beans  . Corn-Containing Products Hives  . Peanuts [Peanut Oil] Hives  . Sodium Laureth Sulfate Hives  . Soy Allergy Hives  . Strawberry Extract Hives     Review of Systems  Constitutional: Negative.   Respiratory: Negative.   Cardiovascular: Negative.   Gastrointestinal: Negative.   Neurological: Negative.   Psychiatric/Behavioral: Negative.      Today's Vitals   12/22/19 1040  BP: 114/72  Pulse: (!) 46  Temp: 97.6 F (36.4 C)  TempSrc: Oral  Weight: 143 lb 12.8 oz (65.2 kg)  Height: 5' 3"  (1.6 m)   Body mass index is 25.47 kg/m.   Objective:  Physical Exam Vitals and  nursing note reviewed.  Constitutional:      Appearance: Normal appearance.  HENT:     Head: Normocephalic and atraumatic.  Cardiovascular:     Rate and Rhythm: Normal rate and regular rhythm.     Heart sounds: Normal heart sounds.  Pulmonary:     Effort: Pulmonary effort is normal.     Breath sounds: Normal breath sounds.  Skin:    General: Skin is warm.  Neurological:     General: No focal deficit present.     Mental Status: She is alert.  Psychiatric:        Mood and Affect: Mood normal.        Behavior: Behavior normal.         Assessment And Plan:     1. Type 2 diabetes mellitus with stage 2 chronic kidney disease, without long-term current use of insulin (HCC)  Chronic, controlled with dietary modification.  I will check labs as listed below. I will make recommendations once her labs are available for review. She is encouraged to get back on track.   - CMP14+EGFR - Lipid panel - Hemoglobin A1c  2. Pure hypercholesterolemia  Chronic, I will check fasting lipid panel and LFTs today. She is encouraged to avoid fried foods, engage in exercise at least 150 minutes per week and will consider fiber supplementation if needed (may be difficult due to her food sensitivities).   3. Overweight with body mass index (BMI) of 25 to 25.9 in adult  Her weight is stable for her BMI.   4. Immunization due  She was given rx Shingrix, advised to get first vaccine 90 days after COVID vaccination.   Ashley Greenland, MD    THE PATIENT IS ENCOURAGED TO PRACTICE SOCIAL DISTANCING DUE TO THE COVID-19 PANDEMIC.

## 2019-12-22 NOTE — Patient Instructions (Signed)
High Cholesterol  High cholesterol is a condition in which the blood has high levels of a white, waxy, fat-like substance (cholesterol). The human body needs small amounts of cholesterol. The liver makes all the cholesterol that the body needs. Extra (excess) cholesterol comes from the food that we eat. Cholesterol is carried from the liver by the blood through the blood vessels. If you have high cholesterol, deposits (plaques) may build up on the walls of your blood vessels (arteries). Plaques make the arteries narrower and stiffer. Cholesterol plaques increase your risk for heart attack and stroke. Work with your health care Xin Klawitter to keep your cholesterol levels in a healthy range. What increases the risk? This condition is more likely to develop in people who:  Eat foods that are high in animal fat (saturated fat) or cholesterol.  Are overweight.  Are not getting enough exercise.  Have a family history of high cholesterol. What are the signs or symptoms? There are no symptoms of this condition. How is this diagnosed? This condition may be diagnosed from the results of a blood test.  If you are older than age 20, your health care Celisse Ciulla may check your cholesterol every 4-6 years.  You may be checked more often if you already have high cholesterol or other risk factors for heart disease. The blood test for cholesterol measures:  "Bad" cholesterol (LDL cholesterol). This is the main type of cholesterol that causes heart disease. The desired level for LDL is less than 100.  "Good" cholesterol (HDL cholesterol). This type helps to protect against heart disease by cleaning the arteries and carrying the LDL away. The desired level for HDL is 60 or higher.  Triglycerides. These are fats that the body can store or burn for energy. The desired number for triglycerides is lower than 150.  Total cholesterol. This is a measure of the total amount of cholesterol in your blood, including LDL  cholesterol, HDL cholesterol, and triglycerides. A healthy number is less than 200. How is this treated? This condition is treated with diet changes, lifestyle changes, and medicines. Diet changes  This may include eating more whole grains, fruits, vegetables, nuts, and fish.  This may also include cutting back on red meat and foods that have a lot of added sugar. Lifestyle changes  Changes may include getting at least 40 minutes of aerobic exercise 3 times a week. Aerobic exercises include walking, biking, and swimming. Aerobic exercise along with a healthy diet can help you maintain a healthy weight.  Changes may also include quitting smoking. Medicines  Medicines are usually given if diet and lifestyle changes have failed to reduce your cholesterol to healthy levels.  Your health care Adilyn Humes may prescribe a statin medicine. Statin medicines have been shown to reduce cholesterol, which can reduce the risk of heart disease. Follow these instructions at home: Eating and drinking If told by your health care Eryk Beavers:  Eat chicken (without skin), fish, veal, shellfish, ground turkey breast, and round or loin cuts of red meat.  Do not eat fried foods or fatty meats, such as hot dogs and salami.  Eat plenty of fruits, such as apples.  Eat plenty of vegetables, such as broccoli, potatoes, and carrots.  Eat beans, peas, and lentils.  Eat grains such as barley, rice, couscous, and bulgur wheat.  Eat pasta without cream sauces.  Use skim or nonfat milk, and eat low-fat or nonfat yogurt and cheeses.  Do not eat or drink whole milk, cream, ice cream, egg yolks,   or hard cheeses.  Do not eat stick margarine or tub margarines that contain trans fats (also called partially hydrogenated oils).  Do not eat saturated tropical oils, such as coconut oil and palm oil.  Do not eat cakes, cookies, crackers, or other baked goods that contain trans fats.  General instructions  Exercise as  directed by your health care Preston Weill. Increase your activity level with activities such as gardening, walking, and taking the stairs.  Take over-the-counter and prescription medicines only as told by your health care Shakisha Abend.  Do not use any products that contain nicotine or tobacco, such as cigarettes and e-cigarettes. If you need help quitting, ask your health care Nicholis Stepanek.  Keep all follow-up visits as told by your health care Cherissa Hook. This is important. Contact a health care Rhianne Soman if:  You are struggling to maintain a healthy diet or weight.  You need help to start on an exercise program.  You need help to stop smoking. Get help right away if:  You have chest pain.  You have trouble breathing. This information is not intended to replace advice given to you by your health care Monterrius Cardosa. Make sure you discuss any questions you have with your health care Lashun Mccants. Document Revised: 07/26/2017 Document Reviewed: 01/21/2016 Elsevier Patient Education  2020 [employer]  

## 2019-12-23 LAB — CMP14+EGFR
ALT: 17 IU/L (ref 0–32)
AST: 28 IU/L (ref 0–40)
Albumin/Globulin Ratio: 1.3 (ref 1.2–2.2)
Albumin: 4 g/dL (ref 3.6–4.6)
Alkaline Phosphatase: 51 IU/L (ref 48–121)
BUN/Creatinine Ratio: 15 (ref 12–28)
BUN: 13 mg/dL (ref 8–27)
Bilirubin Total: 0.6 mg/dL (ref 0.0–1.2)
CO2: 24 mmol/L (ref 20–29)
Calcium: 9.3 mg/dL (ref 8.7–10.3)
Chloride: 100 mmol/L (ref 96–106)
Creatinine, Ser: 0.84 mg/dL (ref 0.57–1.00)
GFR calc Af Amer: 75 mL/min/{1.73_m2} (ref >59)
GFR calc non Af Amer: 65 mL/min/{1.73_m2} (ref >59)
Globulin, Total: 3 g/dL (ref 1.5–4.5)
Glucose: 109 mg/dL — ABNORMAL HIGH (ref 65–99)
Potassium: 4.7 mmol/L (ref 3.5–5.2)
Sodium: 138 mmol/L (ref 134–144)
Total Protein: 7 g/dL (ref 6.0–8.5)

## 2019-12-23 LAB — LIPID PANEL
Chol/HDL Ratio: 3.1 ratio (ref 0.0–4.4)
Cholesterol, Total: 239 mg/dL — ABNORMAL HIGH (ref 100–199)
HDL: 77 mg/dL (ref >39)
LDL Chol Calc (NIH): 150 mg/dL — ABNORMAL HIGH (ref 0–99)
Triglycerides: 73 mg/dL (ref 0–149)
VLDL Cholesterol Cal: 12 mg/dL (ref 5–40)

## 2019-12-23 LAB — HEMOGLOBIN A1C
Est. average glucose Bld gHb Est-mCnc: 151 mg/dL
Hgb A1c MFr Bld: 6.9 % — ABNORMAL HIGH (ref 4.8–5.6)

## 2020-02-11 ENCOUNTER — Encounter: Payer: Self-pay | Admitting: Internal Medicine

## 2020-02-11 DIAGNOSIS — H401111 Primary open-angle glaucoma, right eye, mild stage: Secondary | ICD-10-CM | POA: Diagnosis not present

## 2020-02-11 DIAGNOSIS — H401122 Primary open-angle glaucoma, left eye, moderate stage: Secondary | ICD-10-CM | POA: Diagnosis not present

## 2020-02-11 DIAGNOSIS — E119 Type 2 diabetes mellitus without complications: Secondary | ICD-10-CM | POA: Diagnosis not present

## 2020-02-11 LAB — HM DIABETES EYE EXAM

## 2020-02-24 DIAGNOSIS — L668 Other cicatricial alopecia: Secondary | ICD-10-CM | POA: Diagnosis not present

## 2020-02-24 DIAGNOSIS — L2084 Intrinsic (allergic) eczema: Secondary | ICD-10-CM | POA: Diagnosis not present

## 2020-02-24 DIAGNOSIS — L209 Atopic dermatitis, unspecified: Secondary | ICD-10-CM | POA: Diagnosis not present

## 2020-04-20 ENCOUNTER — Encounter: Payer: Self-pay | Admitting: Internal Medicine

## 2020-04-20 ENCOUNTER — Ambulatory Visit (INDEPENDENT_AMBULATORY_CARE_PROVIDER_SITE_OTHER): Payer: Medicare Other

## 2020-04-20 ENCOUNTER — Ambulatory Visit (INDEPENDENT_AMBULATORY_CARE_PROVIDER_SITE_OTHER): Payer: Medicare Other | Admitting: Internal Medicine

## 2020-04-20 ENCOUNTER — Other Ambulatory Visit: Payer: Self-pay

## 2020-04-20 VITALS — BP 128/62 | HR 52 | Temp 98.2°F | Ht 60.0 in | Wt 142.0 lb

## 2020-04-20 VITALS — BP 128/62 | HR 52 | Temp 98.2°F | Ht 60.0 in | Wt 142.6 lb

## 2020-04-20 DIAGNOSIS — Z0000 Encounter for general adult medical examination without abnormal findings: Secondary | ICD-10-CM

## 2020-04-20 DIAGNOSIS — Z6827 Body mass index (BMI) 27.0-27.9, adult: Secondary | ICD-10-CM

## 2020-04-20 DIAGNOSIS — E7800 Pure hypercholesterolemia, unspecified: Secondary | ICD-10-CM

## 2020-04-20 DIAGNOSIS — N182 Chronic kidney disease, stage 2 (mild): Secondary | ICD-10-CM

## 2020-04-20 DIAGNOSIS — E1122 Type 2 diabetes mellitus with diabetic chronic kidney disease: Secondary | ICD-10-CM

## 2020-04-20 DIAGNOSIS — E559 Vitamin D deficiency, unspecified: Secondary | ICD-10-CM | POA: Diagnosis not present

## 2020-04-20 DIAGNOSIS — Z23 Encounter for immunization: Secondary | ICD-10-CM

## 2020-04-20 DIAGNOSIS — E663 Overweight: Secondary | ICD-10-CM | POA: Diagnosis not present

## 2020-04-20 LAB — POCT URINALYSIS DIPSTICK
Bilirubin, UA: NEGATIVE
Blood, UA: NEGATIVE
Glucose, UA: NEGATIVE
Ketones, UA: NEGATIVE
Nitrite, UA: NEGATIVE
Protein, UA: NEGATIVE
Spec Grav, UA: 1.025 (ref 1.010–1.025)
Urobilinogen, UA: 0.2 E.U./dL
pH, UA: 7 (ref 5.0–8.0)

## 2020-04-20 LAB — POCT UA - MICROALBUMIN
Albumin/Creatinine Ratio, Urine, POC: <30
Creatinine, POC: 200 mg/dL
Microalbumin Ur, POC: 10 mg/L

## 2020-04-20 NOTE — Patient Instructions (Signed)
Health Maintenance, Female Adopting a healthy lifestyle and getting preventive care are important in promoting health and wellness. Ask your health care provider about:  The right schedule for you to have regular tests and exams.  Things you can do on your own to prevent diseases and keep yourself healthy. What should I know about diet, weight, and exercise? Eat a healthy diet   Eat a diet that includes plenty of vegetables, fruits, low-fat dairy products, and lean protein.  Do not eat a lot of foods that are high in solid fats, added sugars, or sodium. Maintain a healthy weight Body mass index (BMI) is used to identify weight problems. It estimates body fat based on height and weight. Your health care provider can help determine your BMI and help you achieve or maintain a healthy weight. Get regular exercise Get regular exercise. This is one of the most important things you can do for your health. Most adults should:  Exercise for at least 150 minutes each week. The exercise should increase your heart rate and make you sweat (moderate-intensity exercise).  Do strengthening exercises at least twice a week. This is in addition to the moderate-intensity exercise.  Spend less time sitting. Even light physical activity can be beneficial. Watch cholesterol and blood lipids Have your blood tested for lipids and cholesterol at 81 years of age, then have this test every 5 years. Have your cholesterol levels checked more often if:  Your lipid or cholesterol levels are high.  You are older than 81 years of age.  You are at high risk for heart disease. What should I know about cancer screening? Depending on your health history and family history, you may need to have cancer screening at various ages. This may include screening for:  Breast cancer.  Cervical cancer.  Colorectal cancer.  Skin cancer.  Lung cancer. What should I know about heart disease, diabetes, and high blood  pressure? Blood pressure and heart disease  High blood pressure causes heart disease and increases the risk of stroke. This is more likely to develop in people who have high blood pressure readings, are of African descent, or are overweight.  Have your blood pressure checked: ? Every 3-5 years if you are 18-39 years of age. ? Every year if you are 40 years old or older. Diabetes Have regular diabetes screenings. This checks your fasting blood sugar level. Have the screening done:  Once every three years after age 40 if you are at a normal weight and have a low risk for diabetes.  More often and at a younger age if you are overweight or have a high risk for diabetes. What should I know about preventing infection? Hepatitis B If you have a higher risk for hepatitis B, you should be screened for this virus. Talk with your health care provider to find out if you are at risk for hepatitis B infection. Hepatitis C Testing is recommended for:  Everyone born from 1945 through 1965.  Anyone with known risk factors for hepatitis C. Sexually transmitted infections (STIs)  Get screened for STIs, including gonorrhea and chlamydia, if: ? You are sexually active and are younger than 81 years of age. ? You are older than 81 years of age and your health care provider tells you that you are at risk for this type of infection. ? Your sexual activity has changed since you were last screened, and you are at increased risk for chlamydia or gonorrhea. Ask your health care provider if   you are at risk.  Ask your health care provider about whether you are at high risk for HIV. Your health care provider may recommend a prescription medicine to help prevent HIV infection. If you choose to take medicine to prevent HIV, you should first get tested for HIV. You should then be tested every 3 months for as long as you are taking the medicine. Pregnancy  If you are about to stop having your period (premenopausal) and  you may become pregnant, seek counseling before you get pregnant.  Take 400 to 800 micrograms (mcg) of folic acid every day if you become pregnant.  Ask for birth control (contraception) if you want to prevent pregnancy. Osteoporosis and menopause Osteoporosis is a disease in which the bones lose minerals and strength with aging. This can result in bone fractures. If you are 65 years old or older, or if you are at risk for osteoporosis and fractures, ask your health care provider if you should:  Be screened for bone loss.  Take a calcium or vitamin D supplement to lower your risk of fractures.  Be given hormone replacement therapy (HRT) to treat symptoms of menopause. Follow these instructions at home: Lifestyle  Do not use any products that contain nicotine or tobacco, such as cigarettes, e-cigarettes, and chewing tobacco. If you need help quitting, ask your health care provider.  Do not use street drugs.  Do not share needles.  Ask your health care provider for help if you need support or information about quitting drugs. Alcohol use  Do not drink alcohol if: ? Your health care provider tells you not to drink. ? You are pregnant, may be pregnant, or are planning to become pregnant.  If you drink alcohol: ? Limit how much you use to 0-1 drink a day. ? Limit intake if you are breastfeeding.  Be aware of how much alcohol is in your drink. In the [place] one drink equals one 12 oz bottle of beer (355 mL), one 5 oz glass of wine (148 mL), or one 1 oz glass of hard liquor (44 mL). General instructions  Schedule regular health, dental, and eye exams.  Stay current with your vaccines.  Tell your health care provider if: ? You often feel depressed. ? You have ever been abused or do not feel safe at home. Summary  Adopting a healthy lifestyle and getting preventive care are important in promoting health and wellness.  Follow your health care provider's instructions about healthy  diet, exercising, and getting tested or screened for diseases.  Follow your health care provider's instructions on monitoring your cholesterol and blood pressure. This information is not intended to replace advice given to you by your health care provider. Make sure you discuss any questions you have with your health care provider. Document Revised: 07/16/2018 Document Reviewed: 07/16/2018 Elsevier Patient Education  2020 [employer]  

## 2020-04-20 NOTE — Patient Instructions (Signed)
Ashley Lyons , Thank you for taking time to come for your Medicare Wellness Visit. I appreciate your ongoing commitment to your health goals. Please review the following plan we discussed and let me know if I can assist you in the future.   Screening recommendations/referrals: Colonoscopy: completed 04/18/2012 Mammogram: rescheduled due to covid vaccine Bone Density: completed 06/23/2018 Recommended yearly ophthalmology/optometry visit for glaucoma screening and checkup Recommended yearly dental visit for hygiene and checkup  Vaccinations: Influenza vaccine: today Pneumococcal vaccine: completed 07/21/2016 Tdap vaccine: completed 09/29/2012 Shingles vaccine: discussed   Covid-19: 11/10/2019, 10/17/2019  Advanced directives: Please bring a copy of your POA (Power of Attorney) and/or Living Will to your next appointment.   Conditions/risks identified: none  Next appointment: Follow up in one year for your annual wellness visit 04/26/2021 at 8:45   Preventive Care 65 Years and Older, Female Preventive care refers to lifestyle choices and visits with your health care provider that can promote health and wellness. What does preventive care include?  A yearly physical exam. This is also called an annual well check.  Dental exams once or twice a year.  Routine eye exams. Ask your health care provider how often you should have your eyes checked.  Personal lifestyle choices, including:  Daily care of your teeth and gums.  Regular physical activity.  Eating a healthy diet.  Avoiding tobacco and drug use.  Limiting alcohol use.  Practicing safe sex.  Taking low-dose aspirin every day.  Taking vitamin and mineral supplements as recommended by your health care provider. What happens during an annual well check? The services and screenings done by your health care provider during your annual well check will depend on your age, overall health, lifestyle risk factors, and family history  of disease. Counseling  Your health care provider may ask you questions about your:  Alcohol use.  Tobacco use.  Drug use.  Emotional well-being.  Home and relationship well-being.  Sexual activity.  Eating habits.  History of falls.  Memory and ability to understand (cognition).  Work and work Statistician.  Reproductive health. Screening  You may have the following tests or measurements:  Height, weight, and BMI.  Blood pressure.  Lipid and cholesterol levels. These may be checked every 5 years, or more frequently if you are over 79 years old.  Skin check.  Lung cancer screening. You may have this screening every year starting at age 81 if you have a 30-pack-year history of smoking and currently smoke or have quit within the past 15 years.  Fecal occult blood test (FOBT) of the stool. You may have this test every year starting at age 45.  Flexible sigmoidoscopy or colonoscopy. You may have a sigmoidoscopy every 5 years or a colonoscopy every 10 years starting at age 94.  Hepatitis C blood test.  Hepatitis B blood test.  Sexually transmitted disease (STD) testing.  Diabetes screening. This is done by checking your blood sugar (glucose) after you have not eaten for a while (fasting). You may have this done every 1-3 years.  Bone density scan. This is done to screen for osteoporosis. You may have this done starting at age 37.  Mammogram. This may be done every 1-2 years. Talk to your health care provider about how often you should have regular mammograms. Talk with your health care provider about your test results, treatment options, and if necessary, the need for more tests. Vaccines  Your health care provider may recommend certain vaccines, such as:  Influenza vaccine.  This is recommended every year.  Tetanus, diphtheria, and acellular pertussis (Tdap, Td) vaccine. You may need a Td booster every 10 years.  Zoster vaccine. You may need this after age  40.  Pneumococcal 13-valent conjugate (PCV13) vaccine. One dose is recommended after age 73.  Pneumococcal polysaccharide (PPSV23) vaccine. One dose is recommended after age 31. Talk to your health care provider about which screenings and vaccines you need and how often you need them. This information is not intended to replace advice given to you by your health care provider. Make sure you discuss any questions you have with your health care provider. Document Released: 08/19/2015 Document Revised: 04/11/2016 Document Reviewed: 05/24/2015 Elsevier Interactive Patient Education  2017 [place] Prevention in the Home Falls can cause injuries. They can happen to people of all ages. There are many things you can do to make your home safe and to help prevent falls. What can I do on the outside of my home?  Regularly fix the edges of walkways and driveways and fix any cracks.  Remove anything that might make you trip as you walk through a door, such as a raised step or threshold.  Trim any bushes or trees on the path to your home.  Use bright outdoor lighting.  Clear any walking paths of anything that might make someone trip, such as rocks or tools.  Regularly check to see if handrails are loose or broken. Make sure that both sides of any steps have handrails.  Any raised decks and porches should have guardrails on the edges.  Have any leaves, snow, or ice cleared regularly.  Use sand or salt on walking paths during winter.  Clean up any spills in your garage right away. This includes oil or grease spills. What can I do in the bathroom?  Use night lights.  Install grab bars by the toilet and in the tub and shower. Do not use towel bars as grab bars.  Use non-skid mats or decals in the tub or shower.  If you need to sit down in the shower, use a plastic, non-slip stool.  Keep the floor dry. Clean up any water that spills on the floor as soon as it happens.  Remove  soap buildup in the tub or shower regularly.  Attach bath mats securely with double-sided non-slip rug tape.  Do not have throw rugs and other things on the floor that can make you trip. What can I do in the bedroom?  Use night lights.  Make sure that you have a light by your bed that is easy to reach.  Do not use any sheets or blankets that are too big for your bed. They should not hang down onto the floor.  Have a firm chair that has side arms. You can use this for support while you get dressed.  Do not have throw rugs and other things on the floor that can make you trip. What can I do in the kitchen?  Clean up any spills right away.  Avoid walking on wet floors.  Keep items that you use a lot in easy-to-reach places.  If you need to reach something above you, use a strong step stool that has a grab bar.  Keep electrical cords out of the way.  Do not use floor polish or wax that makes floors slippery. If you must use wax, use non-skid floor wax.  Do not have throw rugs and other things on the floor that can make  you trip. What can I do with my stairs?  Do not leave any items on the stairs.  Make sure that there are handrails on both sides of the stairs and use them. Fix handrails that are broken or loose. Make sure that handrails are as long as the stairways.  Check any carpeting to make sure that it is firmly attached to the stairs. Fix any carpet that is loose or worn.  Avoid having throw rugs at the top or bottom of the stairs. If you do have throw rugs, attach them to the floor with carpet tape.  Make sure that you have a light switch at the top of the stairs and the bottom of the stairs. If you do not have them, ask someone to add them for you. What else can I do to help prevent falls?  Wear shoes that:  Do not have high heels.  Have rubber bottoms.  Are comfortable and fit you well.  Are closed at the toe. Do not wear sandals.  If you use a  stepladder:  Make sure that it is fully opened. Do not climb a closed stepladder.  Make sure that both sides of the stepladder are locked into place.  Ask someone to hold it for you, if possible.  Clearly mark and make sure that you can see:  Any grab bars or handrails.  First and last steps.  Where the edge of each step is.  Use tools that help you move around (mobility aids) if they are needed. These include:  Canes.  Walkers.  Scooters.  Crutches.  Turn on the lights when you go into a dark area. Replace any light bulbs as soon as they burn out.  Set up your furniture so you have a clear path. Avoid moving your furniture around.  If any of your floors are uneven, fix them.  If there are any pets around you, be aware of where they are.  Review your medicines with your doctor. Some medicines can make you feel dizzy. This can increase your chance of falling. Ask your doctor what other things that you can do to help prevent falls. This information is not intended to replace advice given to you by your health care provider. Make sure you discuss any questions you have with your health care provider. Document Released: 05/19/2009 Document Revised: 12/29/2015 Document Reviewed: 08/27/2014 Elsevier Interactive Patient Education  2017 [employer].

## 2020-04-20 NOTE — Progress Notes (Signed)
I,Tianna Badgett,acting as a Education administrator for [name], MD.,have documented all relevant documentation on the behalf of [name], MD,as directed by  [name], MD while in the presence of [name], MD.  This visit occurred during the SARS-CoV-2 public health emergency.  Safety protocols were in place, including screening questions prior to the visit, additional usage of staff PPE, and extensive cleaning of exam room while observing appropriate contact time as indicated for disinfecting solutions.  Subjective:     Patient ID: Ashley Lyons , female    DOB: 1941/01/18 , 79 y.o.   MRN: [id number]   Chief Complaint  Patient presents with  . Annual Exam  . Diabetes    HPI  She is here today for full physical examination. She also had AWV performed by [facility] today. She has no specific concerns or complaints at this time.   Diabetes She presents for her follow-up diabetic visit. She has type 2 diabetes mellitus. There are no hypoglycemic associated symptoms. Pertinent negatives for diabetes include no blurred vision and no chest pain. There are no hypoglycemic complications. Risk factors for coronary artery disease include diabetes mellitus, dyslipidemia, sedentary lifestyle and post-menopausal. Current diabetic treatment includes diet. She is compliant with treatment most of the time. She is following a diabetic diet. She participates in exercise intermittently. An ACE inhibitor/angiotensin II receptor blocker is not being taken. Eye exam is current.     Past Medical History:  Diagnosis Date  . Allergy   . Diabetes mellitus   . GERD (gastroesophageal reflux disease)   . Glaucoma    right eye  . Hyperlipidemia   . Leukopenia      Family History  Problem Relation Age of Onset  . Prostate cancer Father   . Diabetes Mother   . Prostate cancer Brother   . Kidney disease Sister   . Liver disease Brother   . Colon cancer Brother   . Esophageal cancer Neg  Hx   . Rectal cancer Neg Hx   . Stomach cancer Neg Hx      Current Outpatient Medications:  .  Barberry-Oreg Grape-Goldenseal (BERBERINE COMPLEX PO), Take 500 mg by mouth., Disp: , Rfl:  .  BIOTIN PO, Take 100 mg by mouth. , Disp: , Rfl:  .  CALCIUM PO, Take 1,000 mg by mouth. , Disp: , Rfl:  .  Cholecalciferol (VITAMIN D3) 50 MCG (2000 UT) capsule, Take 2,000 Units by mouth daily., Disp: , Rfl:  .  co-enzyme Q-10 30 MG capsule, Take 30 mg by mouth daily. , Disp: , Rfl:  .  DiphenhydrAMINE HCl (BENADRYL ALLERGY PO), Take 25 mg by mouth as needed., Disp: , Rfl:  .  hydrocortisone 2.5 % cream, Apply to face twice daily for 2 wks, then daily for 2 wks, then daily as needed for itching, Disp: , Rfl:  .  Multiple Vitamin (MULTIVITAMIN) tablet, Take 1 tablet by mouth daily., Disp: , Rfl:  .  omega-3 acid ethyl esters (LOVAZA) 1 g capsule, Take by mouth daily. , Disp: , Rfl:  .  pravastatin (PRAVACHOL) 20 MG tablet, Take 1 tablet (20 mg total) by mouth daily. (Patient taking differently: Take 20 mg by mouth daily. 1/2 tablet daily), Disp: 90 tablet, Rfl: 3 .  SPIRULINA PO, Take by mouth. As needed, Disp: , Rfl:  .  timolol (TIMOPTIC) 0.5 % ophthalmic solution, , Disp: , Rfl:  .  vitamin C (ASCORBIC ACID) 500 MG tablet, Take 500  mg by mouth daily., Disp: , Rfl:    Allergies  Allergen Reactions  . Almond Oil Hives    Almonds  . Bean Pod Extract Hives    String Beans  . Corn-Containing Products Hives  . Peanuts [Peanut Oil] Hives  . Sodium Laureth Sulfate Hives  . Soy Allergy Hives  . Strawberry Extract Hives      The patient states she uses post menopausal status for birth control. Last LMP was No LMP recorded. Patient has had a hysterectomy.. Negative for Dysmenorrhea. Negative for: breast discharge, breast lump(s), breast pain and breast self exam. Associated symptoms include abnormal vaginal bleeding. Pertinent negatives include abnormal bleeding (hematology), anxiety, decreased libido,  depression, difficulty falling sleep, dyspareunia, history of infertility, nocturia, sexual dysfunction, sleep disturbances, urinary incontinence, urinary urgency, vaginal discharge and vaginal itching. Diet regular.The patient states her exercise level is  minimal.   . The patient's tobacco use is:  Social History   Tobacco Use  Smoking Status Never Smoker  Smokeless Tobacco Never Used  . She has been exposed to passive smoke. The patient's alcohol use is:  Social History   Substance and Sexual Activity  Alcohol Use Not Currently   Comment: wine on occasion   Review of Systems  Constitutional: Negative.   HENT: Negative.   Eyes: Negative.  Negative for blurred vision.  Respiratory: Negative.   Cardiovascular: Negative.  Negative for chest pain.  Gastrointestinal: Negative.   Endocrine: Negative.   Genitourinary: Negative.   Musculoskeletal: Negative.   Skin: Negative.   Allergic/Immunologic: Negative.   Neurological: Negative.   Hematological: Negative.   Psychiatric/Behavioral: Negative.      Today's Vitals   04/20/20 0917  BP: 128/62  Pulse: (!) 52  Temp: 98.2 F (36.8 C)  TempSrc: Oral  SpO2: 98%  Weight: 142 lb (64.4 kg)  Height: 5' (1.524 m)   Body mass index is 27.73 kg/m.   Objective:  Physical Exam Vitals and nursing note reviewed.  Constitutional:      Appearance: Normal appearance.  HENT:     Head: Normocephalic and atraumatic.     Right Ear: Tympanic membrane, ear canal and external ear normal.     Left Ear: Tympanic membrane, ear canal and external ear normal.     Nose:     Comments: Deferred, masked    Mouth/Throat:     Comments: Deferred, masked Eyes:     Extraocular Movements: Extraocular movements intact.     Conjunctiva/sclera: Conjunctivae normal.     Pupils: Pupils are equal, round, and reactive to light.  Cardiovascular:     Rate and Rhythm: Normal rate and regular rhythm.     Pulses: Normal pulses.          Dorsalis pedis pulses  are 2+ on the right side and 2+ on the left side.     Heart sounds: Normal heart sounds.  Pulmonary:     Effort: Pulmonary effort is normal.     Breath sounds: Normal breath sounds.  Chest:     Breasts: Tanner Score is 5.        Right: Normal.        Left: Normal.  Abdominal:     General: Abdomen is flat. Bowel sounds are normal.     Palpations: Abdomen is soft.  Genitourinary:    Comments: deferred Musculoskeletal:        General: Normal range of motion.     Cervical back: Normal range of motion and neck supple.  Right foot: Bunion present.     Left foot: Bunion present.  Feet:     Right foot:     Protective Sensation: 5 sites tested. 5 sites sensed.     Skin integrity: Skin integrity normal.     Toenail Condition: Right toenails are normal.     Left foot:     Protective Sensation: 5 sites tested. 5 sites sensed.     Skin integrity: Skin integrity normal.     Toenail Condition: Left toenails are normal.  Skin:    General: Skin is warm and dry.  Neurological:     General: No focal deficit present.     Mental Status: She is alert and oriented to person, place, and time.  Psychiatric:        Mood and Affect: Mood normal.        Behavior: Behavior normal.         Assessment And Plan:     1. Routine general medical examination at health care facility Comments: A full exam was performed.  Importance of monthly self breast exams was discussed with the patient.AWV performed today by [facility].  PATIENT IS ADVISED TO GET 30-45 MINUTES REGULAR EXERCISE NO LESS THAN FOUR TO FIVE DAYS PER WEEK - BOTH WEIGHTBEARING EXERCISES AND AEROBIC ARE RECOMMENDED.  PATIENT IS ADVISED TO FOLLOW A HEALTHY DIET WITH AT LEAST SIX FRUITS/VEGGIES PER DAY, DECREASE INTAKE OF RED MEAT, AND TO INCREASE FISH INTAKE TO TWO DAYS PER WEEK.  MEATS/FISH SHOULD NOT BE FRIED, BAKED OR BROILED IS PREFERABLE.  I SUGGEST WEARING SPF 50 SUNSCREEN ON EXPOSED PARTS AND ESPECIALLY WHEN IN THE DIRECT SUNLIGHT FOR AN  EXTENDED PERIOD OF TIME.  PLEASE AVOID FAST FOOD RESTAURANTS AND INCREASE YOUR WATER INTAKE.   2. Type 2 diabetes mellitus with stage 2 chronic kidney disease, without long-term current use of insulin (HCC) Comments: Diabetic foot exam was performed. I DISCUSSED WITH THE PATIENT AT LENGTH REGARDING THE GOALS OF GLYCEMIC CONTROL AND POSSIBLE LONG-TERM COMPLICATIONS.  I  ALSO STRESSED THE IMPORTANCE OF COMPLIANCE WITH HOME GLUCOSE MONITORING, DIETARY RESTRICTIONS INCLUDING AVOIDANCE OF SUGARY DRINKS/PROCESSED FOODS,  ALONG WITH REGULAR EXERCISE.  I  ALSO STRESSED THE IMPORTANCE OF ANNUAL EYE EXAMS, SELF FOOT CARE AND COMPLIANCE WITH OFFICE VISITS.  - CBC - Hemoglobin A1c - CMP14+EGFR - EKG 12-Lead  3. Pure hypercholesterolemia Comments: Chronic, last LDL 150. Encouraged to start walking regimen. Does not wish to increase dose b/c h/o myalgias. I will recheck in four months. Encouraged to continue with pravastatin daily.   4. Overweight with body mass index (BMI) of 27 to 27.9 in adult Comments: Her BMI is stable for her demographic. Encouraged to work up to 150 minutes of walking per week.   5. Vitamin D deficiency Comments: I will check vitamin D level and supplement as needed.  - VITAMIN D 25 Hydroxy (Vit-D Deficiency, Fractures)    Patient was given opportunity to ask questions. Patient verbalized understanding of the plan and was able to repeat key elements of the plan. All questions were answered to their satisfaction.   [name], MD   I, [name], MD, have reviewed all documentation for this visit. The documentation on 04/20/20 for the exam, diagnosis, procedures, and orders are all accurate and complete.  THE PATIENT IS ENCOURAGED TO PRACTICE SOCIAL DISTANCING DUE TO THE COVID-19 PANDEMIC.

## 2020-04-20 NOTE — Progress Notes (Signed)
This visit occurred during the SARS-CoV-2 public health emergency.  Safety protocols were in place, including screening questions prior to the visit, additional usage of staff PPE, and extensive cleaning of exam room while observing appropriate contact time as indicated for disinfecting solutions.  Subjective:   Ashley Lyons is a 79 y.o. female who presents for Medicare Annual (Subsequent) preventive examination.  Review of Systems     Cardiac Risk Factors include: advanced age (>66men, >40 women);diabetes mellitus;sedentary lifestyle     Objective:    Today's Vitals   04/20/20 0841  BP: 128/62  Pulse: (!) 52  Temp: 98.2 F (36.8 C)  TempSrc: Oral  SpO2: 98%  Weight: 142 lb 9.6 oz (64.7 kg)  Height: 5' (1.524 m)   Body mass index is 27.85 kg/m.  Advanced Directives 04/20/2020 04/15/2019  Does Patient Have a Medical Advance Directive? Yes Yes  Type of Paramedic of [place];Living will [place];Living will  Copy of [place] in Chart? No - copy requested No - copy requested    Current Medications (verified) Outpatient Encounter Medications as of 04/20/2020  Medication Sig  . Barberry-Oreg Grape-Goldenseal (BERBERINE COMPLEX PO) Take 500 mg by mouth.  Marland Kitchen BIOTIN PO Take 100 mg by mouth.   Marland Kitchen CALCIUM PO Take 1,000 mg by mouth.   . Cholecalciferol (VITAMIN D3) 50 MCG (2000 UT) capsule Take 2,000 Units by mouth daily.  Marland Kitchen co-enzyme Q-10 30 MG capsule Take 30 mg by mouth daily.   . DiphenhydrAMINE HCl (BENADRYL ALLERGY PO) Take 25 mg by mouth as needed.  . hydrocortisone 2.5 % cream Apply to face twice daily for 2 wks, then daily for 2 wks, then daily as needed for itching  . Multiple Vitamin (MULTIVITAMIN) tablet Take 1 tablet by mouth daily.  Marland Kitchen omega-3 acid ethyl esters (LOVAZA) 1 g capsule Take by mouth daily.   . pravastatin (PRAVACHOL) 20 MG tablet Take 1 tablet (20 mg total) by mouth daily. (Patient taking  differently: Take 20 mg by mouth daily. 1/2 tablet daily)  . SPIRULINA PO Take by mouth. As needed  . timolol (TIMOPTIC) 0.5 % ophthalmic solution   . vitamin C (ASCORBIC ACID) 500 MG tablet Take 500 mg by mouth daily.   No facility-administered encounter medications on file as of 04/20/2020.    Allergies (verified) Almond oil, Bean pod extract, Corn-containing products, Peanuts [peanut oil], Sodium laureth sulfate, Soy allergy, and Strawberry extract   History: Past Medical History:  Diagnosis Date  . Allergy   . Diabetes mellitus   . GERD (gastroesophageal reflux disease)   . Glaucoma    right eye  . Hyperlipidemia   . Leukopenia    Past Surgical History:  Procedure Laterality Date  . APPENDECTOMY    . COLONOSCOPY    . TONSILLECTOMY    . TUBAL LIGATION    . VAGINAL HYSTERECTOMY     Family History  Problem Relation Age of Onset  . Prostate cancer Father   . Diabetes Mother   . Prostate cancer Brother   . Kidney disease Sister   . Liver disease Brother   . Colon cancer Brother   . Esophageal cancer Neg Hx   . Rectal cancer Neg Hx   . Stomach cancer Neg Hx    Social History   Socioeconomic History  . Marital status: Married    Spouse name: Not on file  . Number of children: Not on file  . Years of education: Not  on file  . Highest education level: Not on file  Occupational History  . Occupation: retired  Tobacco Use  . Smoking status: Never Smoker  . Smokeless tobacco: Never Used  Vaping Use  . Vaping Use: Never used  Substance and Sexual Activity  . Alcohol use: Not Currently    Comment: wine on occasion  . Drug use: No  . Sexual activity: Not Currently  Other Topics Concern  . Not on file  Social History Narrative  . Not on file   Social Determinants of Health   Financial Resource Strain: Low Risk   . Difficulty of Paying Living Expenses: Not hard at all  Food Insecurity: No Food Insecurity  . Worried About Charity fundraiser in the Last Year:  Never true  . Ran Out of Food in the Last Year: Never true  Transportation Needs: No Transportation Needs  . Lack of Transportation (Medical): No  . Lack of Transportation (Non-Medical): No  Physical Activity: Inactive  . Days of Exercise per Week: 0 days  . Minutes of Exercise per Session: 0 min  Stress: No Stress Concern Present  . Feeling of Stress : Not at all  Social Connections:   . Frequency of Communication with Friends and Family: Not on file  . Frequency of Social Gatherings with Friends and Family: Not on file  . Attends Religious Services: Not on file  . Active Member of Clubs or Organizations: Not on file  . Attends Archivist Meetings: Not on file  . Marital Status: Not on file    Tobacco Counseling Counseling given: Not Answered   Clinical Intake:  Pre-visit preparation completed: Yes  Pain : No/denies pain     Nutritional Status: BMI 25 -29 Overweight Nutritional Risks: None Diabetes: Yes  How often do you need to have someone help you when you read instructions, pamphlets, or other written materials from your doctor or pharmacy?: 1 - Never What is the last grade level you completed in school?: graduate  Diabetic? Yes Nutrition Risk Assessment:  Has the patient had any N/V/D within the last 2 months?  No  Does the patient have any non-healing wounds?  No  Has the patient had any unintentional weight loss or weight gain?  No   Diabetes:  Is the patient diabetic?  Yes  If diabetic, was a CBG obtained today?  No  Did the patient bring in their glucometer from home?  No  How often do you monitor your CBG's? Does not.   Financial Strains and Diabetes Management:  Are you having any financial strains with the device, your supplies or your medication? No .  Does the patient want to be seen by Chronic Care Management for management of their diabetes?  No  Would the patient like to be referred to a Nutritionist or for Diabetic Management?  No     Diabetic Exams:  Diabetic Eye Exam: Completed 02/11/2020 Diabetic Foot Exam: Overdue, Pt has been advised about the importance in completing this exam. Pt is scheduled for diabetic foot exam on today.   Interpreter Needed?: No  Information entered by :: NAllen LPN   Activities of Daily Living In your present state of health, do you have any difficulty performing the following activities: 04/20/2020  Hearing? N  Vision? N  Difficulty concentrating or making decisions? N  Walking or climbing stairs? N  Dressing or bathing? N  Doing errands, shopping? N  Preparing Food and eating ? N  Using the  Toilet? N  In the past six months, have you accidently leaked urine? N  Do you have problems with loss of bowel control? N  Managing your Medications? N  Managing your Finances? N  Housekeeping or managing your Housekeeping? N  Some recent data might be hidden    Patient Care Team: Glendale Chard, MD as PCP - General (Internal Medicine) Skeet Latch, MD as PCP - Cardiology (Cardiology)  Indicate any recent Medical Services you may have received from other than [facility] providers in the past year (date may be approximate).     Assessment:   This is a routine wellness examination for Ashley Lyons.  Hearing/Vision screen  Hearing Screening   125Hz  250Hz  500Hz  1000Hz  2000Hz  3000Hz  4000Hz  6000Hz  8000Hz   Right ear:           Left ear:           Vision Screening Comments: Regular eye exams, Dr. Jerline Pain  Dietary issues and exercise activities discussed: Current Exercise Habits: The patient does not participate in regular exercise at present  Goals    . Patient Stated     04/20/2020, wants to get to 135 pounds and eat healthy    . Weight (lb) < 200 lb (90.7 kg)     04/15/2019, would like to weigh 135 pounds      Depression Screen PHQ 2/9 Scores 04/20/2020 04/15/2019 11/12/2018 07/15/2018 08/03/2015 05/16/2015  PHQ - 2 Score 0 0 0 0 0 0  PHQ- 9 Score - 0 - - - -    Fall Risk Fall Risk   04/20/2020 08/20/2019 04/15/2019 11/12/2018 07/15/2018  Falls in the past year? 0 0 0 1 0  Number falls in past yr: - - - 0 -  Injury with Fall? - - - 0 -  Risk for fall due to : No Fall Risks - - - -  Follow up Falls evaluation completed;Education provided;Falls prevention discussed - Falls evaluation completed;Education provided;Falls prevention discussed - -    Any stairs in or around the home? Yes  If so, are there any without handrails? No  Home free of loose throw rugs in walkways, pet beds, electrical cords, etc? Yes  Adequate lighting in your home to reduce risk of falls? Yes   ASSISTIVE DEVICES UTILIZED TO PREVENT FALLS:  Life alert? No  Use of a cane, walker or w/c? No  Grab bars in the bathroom? Yes  Shower chair or bench in shower? No  Elevated toilet seat or a handicapped toilet? No   TIMED UP AND GO:  Was the test performed? No .  Gait steady and fast without use of assistive device  Cognitive Function:     6CIT Screen 04/20/2020 04/15/2019  What Year? 0 points 0 points  What month? 0 points 0 points  What time? 0 points 0 points  Count back from 20 2 points 0 points  Months in reverse 0 points 0 points  Repeat phrase 2 points 0 points  Total Score 4 0    Immunizations Immunization History  Administered Date(s) Administered  . DTaP 10/27/2012  . Fluad Quad(high Dose 65+) 04/20/2020  . Influenza, High Dose Seasonal PF 05/05/2018, 04/15/2019  . Influenza-Unspecified 05/15/2018  . PFIZER SARS-COV-2 Vaccination 10/17/2019, 11/10/2019  . Pneumococcal Conjugate-13 07/11/2016  . Pneumococcal-Unspecified 10/27/2010, 07/21/2016    TDAP status: Up to date Flu Vaccine status: Completed at today's visit Pneumococcal vaccine status: Up to date Covid-19 vaccine status: Completed vaccines  Qualifies for Shingles Vaccine? Yes   Zostavax  completed No   Shingrix Completed?: No.    Education has been provided regarding the importance of this vaccine. Patient has been  advised to call insurance company to determine out of pocket expense if they have not yet received this vaccine. Advised may also receive vaccine at local pharmacy or Health Dept. Verbalized acceptance and understanding.  Screening Tests Health Maintenance  Topic Date Due  . FOOT EXAM  04/14/2020  . HEMOGLOBIN A1C  06/23/2020  . OPHTHALMOLOGY EXAM  02/10/2021  . URINE MICROALBUMIN  04/20/2021  . TETANUS/TDAP  10/28/2022  . INFLUENZA VACCINE  Completed  . DEXA SCAN  Completed  . COVID-19 Vaccine  Completed  . PNA vac Low Risk Adult  Completed    Health Maintenance  Health Maintenance Due  Topic Date Due  . FOOT EXAM  04/14/2020    Colorectal cancer screening: Completed 04/18/2012. Repeat every 10 years Mammogram status: No longer required.  Bone Density status: Completed 06/23/2018.   Lung Cancer Screening: (Low Dose CT Chest recommended if Age 81-80 years, 30 pack-year currently smoking OR have quit w/in 15years.) does not qualify.   Lung Cancer Screening Referral: no  Additional Screening:  Hepatitis C Screening: does not qualify;  Vision Screening: Recommended annual ophthalmology exams for early detection of glaucoma and other disorders of the eye. Is the patient up to date with their annual eye exam?  Yes  Who is the provider or what is the name of the office in which the patient attends annual eye exams? Dr. Jerline Pain If pt is not established with a provider, would they like to be referred to a provider to establish care? No .   Dental Screening: Recommended annual dental exams for proper oral hygiene  Community Resource Referral / Chronic Care Management: CRR required this visit?  No   CCM required this visit?  No      Plan:     I have personally reviewed and noted the following in the patient's chart:   . Medical and social history . Use of alcohol, tobacco or illicit drugs  . Current medications and supplements . Functional ability and  status . Nutritional status . Physical activity . Advanced directives . List of other physicians . Hospitalizations, surgeries, and ER visits in previous 12 months . Vitals . Screenings to include cognitive, depression, and falls . Referrals and appointments  In addition, I have reviewed and discussed with patient certain preventive protocols, quality metrics, and best practice recommendations. A written personalized care plan for preventive services as well as general preventive health recommendations were provided to patient.     Kellie Simmering, LPN   3/50/0938   Nurse Notes:

## 2020-04-21 LAB — CMP14+EGFR
ALT: 12 IU/L (ref 0–32)
AST: 22 IU/L (ref 0–40)
Albumin/Globulin Ratio: 1.5 (ref 1.2–2.2)
Albumin: 4.2 g/dL (ref 3.6–4.6)
Alkaline Phosphatase: 57 IU/L (ref 44–121)
BUN/Creatinine Ratio: 13 (ref 12–28)
BUN: 12 mg/dL (ref 8–27)
Bilirubin Total: 0.7 mg/dL (ref 0.0–1.2)
CO2: 24 mmol/L (ref 20–29)
Calcium: 9.3 mg/dL (ref 8.7–10.3)
Chloride: 102 mmol/L (ref 96–106)
Creatinine, Ser: 0.9 mg/dL (ref 0.57–1.00)
GFR calc Af Amer: 69 mL/min/{1.73_m2} (ref >59)
GFR calc non Af Amer: 60 mL/min/{1.73_m2} (ref >59)
Globulin, Total: 2.8 g/dL (ref 1.5–4.5)
Glucose: 109 mg/dL — ABNORMAL HIGH (ref 65–99)
Potassium: 4.4 mmol/L (ref 3.5–5.2)
Sodium: 140 mmol/L (ref 134–144)
Total Protein: 7 g/dL (ref 6.0–8.5)

## 2020-04-21 LAB — CBC
Hematocrit: 39.1 % (ref 34.0–46.6)
Hemoglobin: 13.2 g/dL (ref 11.1–15.9)
MCH: 34.6 pg — ABNORMAL HIGH (ref 26.6–33.0)
MCHC: 33.8 g/dL (ref 31.5–35.7)
MCV: 102 fL — ABNORMAL HIGH (ref 79–97)
Platelets: 249 10*3/uL (ref 150–450)
RBC: 3.82 x10E6/uL (ref 3.77–5.28)
RDW: 12.8 % (ref 11.7–15.4)
WBC: 3.3 10*3/uL — ABNORMAL LOW (ref 3.4–10.8)

## 2020-04-21 LAB — HEMOGLOBIN A1C
Est. average glucose Bld gHb Est-mCnc: 146 mg/dL
Hgb A1c MFr Bld: 6.7 % — ABNORMAL HIGH (ref 4.8–5.6)

## 2020-04-21 LAB — VITAMIN D 25 HYDROXY (VIT D DEFICIENCY, FRACTURES): Vit D, 25-Hydroxy: 41.9 ng/mL (ref 30.0–100.0)

## 2020-06-15 ENCOUNTER — Encounter: Payer: Self-pay | Admitting: Cardiovascular Disease

## 2020-06-15 ENCOUNTER — Ambulatory Visit: Payer: Medicare Other | Admitting: Cardiovascular Disease

## 2020-06-15 ENCOUNTER — Other Ambulatory Visit: Payer: Self-pay

## 2020-06-15 VITALS — BP 140/74 | HR 87 | Temp 93.7°F | Ht 63.0 in | Wt 141.0 lb

## 2020-06-15 DIAGNOSIS — Z5181 Encounter for therapeutic drug level monitoring: Secondary | ICD-10-CM | POA: Diagnosis not present

## 2020-06-15 DIAGNOSIS — R001 Bradycardia, unspecified: Secondary | ICD-10-CM | POA: Diagnosis not present

## 2020-06-15 DIAGNOSIS — E7800 Pure hypercholesterolemia, unspecified: Secondary | ICD-10-CM

## 2020-06-15 MED ORDER — PRAVASTATIN SODIUM 40 MG PO TABS
40.0000 mg | ORAL_TABLET | Freq: Every day | ORAL | 3 refills | Status: DC
Start: 2020-06-15 — End: 2021-07-04

## 2020-06-15 MED ORDER — PRAVASTATIN SODIUM 40 MG PO TABS
40.0000 mg | ORAL_TABLET | Freq: Every day | ORAL | 3 refills | Status: DC
Start: 2020-06-15 — End: 2020-06-15

## 2020-06-15 NOTE — Patient Instructions (Signed)
Medication Instructions:  INCREASE YOUR PRAVASTATIN TO 40 MG DAILY   *If you need a refill on your cardiac medications before your next appointment, please call your pharmacy*  Lab Work: FASTING LP/CMET IN 3 MONHTS  If you have labs (blood work) drawn today and your tests are completely normal, you will receive your results only by: Marland Kitchen MyChart Message (if you have MyChart) OR . A paper copy in the mail If you have any lab test that is abnormal or we need to change your treatment, we will call you to review the results.  Testing/Procedures: NONE  Follow-Up: At [facility], you and your health needs are our priority.  As part of our continuing mission to provide you with exceptional heart care, we have created designated Provider Care Teams.  These Care Teams include your primary Cardiologist (physician) and Advanced Practice Providers (APPs -  Physician Assistants and Nurse Practitioners) who all work together to provide you with the care you need, when you need it.  We recommend signing up for the patient portal called "MyChart".  Sign up information is provided on this After Visit Summary.  MyChart is used to connect with patients for Virtual Visits (Telemedicine).  Patients are able to view lab/test results, encounter notes, upcoming appointments, etc.  Non-urgent messages can be sent to your provider as well.   To learn more about what you can do with MyChart, go to [email or web address].    Your next appointment:   6 month(s)  You will receive a reminder letter in the mail two months in advance. If you don't receive a letter, please call our office to schedule the follow-up appointment.  The format for your next appointment:   In Person  Provider:   You may see Skeet Latch, MD or one of the following Advanced Practice Providers on your designated Care Team:    Kerin Ransom, PA-C  [place], [place]  Coletta Memos, [place]

## 2020-06-15 NOTE — Progress Notes (Signed)
Cardiology Office Note   Date:  06/15/2020   ID:  Ashley Lyons, DOB [birth year], MRN [id number]  PCP:  Glendale Chard, MD  Cardiologist:   Skeet Latch, MD   No chief complaint on file.   History of Present Illness: Ashley Lyons is a 79 y.o. female with diabetes, hyperlipidemia, and GERD here for follow-up.  She was initially seen 06/2019 for evaluation of bradycardia.  She saw Dr. Baird Cancer on 04/2019 and was noted to be bradycardic.  Her heart rate was 43 bpm at that appointment.  EKG at that time revealed sinus bradycardia at 43 bpm and LAFB.  She has a history of bradycardia but this was the lowest recorded value.  At her cardiology appointment she was feeling well and asymptomatic.  No changes were made and she was not referred for pacemaker.  We did recommend increasing her pravastatin to daily, which she has tolerated well.  She has been feeling well and denies any myalgias.  She has been under a lot of stress from work.  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  This is stopped her from getting exercise, despite her intentions to increase her exercise.  Her grandson has a gym and she has an exercise bike.  She just does not have time to use them.  She does her house work and denies any exertional chest pain or shortness of breath.  She has no lower extremity edema, orthopnea, or PND.  She reports that her diet has been good.  She does not eat any fried foods or fatty foods.  She mostly eats fish and has red meat rarely.   Past Medical History:  Diagnosis Date   Allergy    Diabetes mellitus    GERD (gastroesophageal reflux disease)    Glaucoma    right eye   Hyperlipidemia    Leukopenia     Past Surgical History:  Procedure Laterality Date   APPENDECTOMY     COLONOSCOPY     TONSILLECTOMY     TUBAL LIGATION     VAGINAL HYSTERECTOMY       Current Outpatient Medications  Medication Sig Dispense Refill   Barberry-Oreg Grape-Goldenseal (BERBERINE  COMPLEX PO) Take 500 mg by mouth.     BIOTIN PO Take 100 mg by mouth.      CALCIUM PO Take 1,000 mg by mouth.      Cholecalciferol (VITAMIN D3) 50 MCG (2000 UT) capsule Take 2,000 Units by mouth daily.     co-enzyme Q-10 30 MG capsule Take 30 mg by mouth daily.      DiphenhydrAMINE HCl (BENADRYL ALLERGY PO) Take 25 mg by mouth as needed.     hydrocortisone 2.5 % cream Apply to face twice daily for 2 wks, then daily for 2 wks, then daily as needed for itching     Multiple Vitamin (MULTIVITAMIN) tablet Take 1 tablet by mouth daily.     omega-3 acid ethyl esters (LOVAZA) 1 g capsule Take by mouth daily.      pravastatin (PRAVACHOL) 40 MG tablet Take 1 tablet (40 mg total) by mouth daily. 90 tablet 3   SPIRULINA PO Take by mouth. As needed     timolol (TIMOPTIC) 0.5 % ophthalmic solution      vitamin C (ASCORBIC ACID) 500 MG tablet Take 500 mg by mouth daily.     No current facility-administered medications for this visit.    Allergies:   Almond oil, Bean pod extract, Corn-containing products,  Peanuts [peanut oil], Sodium laureth sulfate, Soy allergy, and Strawberry extract    Social History:  The patient  reports that she has never smoked. She has never used smokeless tobacco. She reports previous alcohol use. She reports that she does not use drugs.   Family History:  The patient's family history includes Colon cancer in her brother; Diabetes in her mother; Kidney disease in her sister; Liver disease in her brother; Prostate cancer in her brother and father.    ROS:  Please see the history of present illness.   Otherwise, review of systems are positive for none.   All other systems are reviewed and negative.    PHYSICAL EXAM: VS:  BP 140/74    Pulse 87    Temp (!) 93.7 F (34.3 C)    Ht 5\' 3"  (1.6 m)    Wt 141 lb (64 kg)    SpO2 99%    BMI 24.98 kg/m  , BMI Body mass index is 24.98 kg/m. GENERAL:  Well appearing HEENT: Pupils equal round and reactive, fundi not visualized,  oral mucosa unremarkable NECK:  No jugular venous distention, waveform within normal limits, carotid upstroke brisk and symmetric, no bruits LUNGS:  Clear to auscultation bilaterally HEART:  RRR.  PMI not displaced or sustained,S1 and S2 within normal limits, no S3, no S4, no clicks, no rubs, no murmurs ABD:  Flat, positive bowel sounds normal in frequency in pitch, no bruits, no rebound, no guarding, no midline pulsatile mass, no hepatomegaly, no splenomegaly EXT:  2 plus pulses throughout, no edema, no cyanosis no clubbing SKIN:  No rashes no nodules NEURO:  Cranial nerves II through XII grossly intact, motor grossly intact throughout PSYCH:  Cognitively intact, oriented to person place and time   EKG:  EKG is not ordered today. 04/15/2019: Sinus bradycardia.  Rate 43 bpm.  LAFB.   Recent Labs: 04/20/2020: ALT 12; BUN 12; Creatinine, Ser 0.90; Hemoglobin 13.2; Platelets 249; Potassium 4.4; Sodium 140    Lipid Panel    Component Value Date/Time   CHOL 239 (H) 12/22/2019 1149   TRIG 73 12/22/2019 1149   HDL 77 12/22/2019 1149   CHOLHDL 3.1 12/22/2019 1149   LDLCALC 150 (H) 12/22/2019 1149      Wt Readings from Last 3 Encounters:  06/15/20 141 lb (64 kg)  04/20/20 142 lb (64.4 kg)  04/20/20 142 lb 9.6 oz (64.7 kg)      ASSESSMENT AND PLAN:  # Bradycardia:  She is asymptomatic.  Heart rate today was 87 bpm.  Avoid any nodal agents.  # Hyperlipidemia:  Lipids are poorly controlled.  LDL should be at least <100.  Her diet has improved but her lipids remain above goal.  We will increase pravastatin to 40 mg.  I do not see that she actually has any statin allergies.  If her lipids remain above goal would consider switching to an alternative agent.  Current medicines are reviewed at length with the patient today.  The patient does not have concerns regarding medicines.  The following changes have been made:  Pravastatin 20mg  daily  Labs/ tests ordered today include:   Orders  Placed This Encounter  Procedures   Lipid panel   Comprehensive metabolic panel     Disposition:   FU with Danila Eddie C. Oval Linsey, MD, [facility] in 6 months.       Signed, Azhar Yogi C. Oval Linsey, MD, [facility]  06/15/2020 12:35 PM    [place]

## 2020-07-14 ENCOUNTER — Encounter: Payer: Self-pay | Admitting: Internal Medicine

## 2020-08-30 ENCOUNTER — Other Ambulatory Visit: Payer: Self-pay

## 2020-08-30 ENCOUNTER — Encounter: Payer: Self-pay | Admitting: Internal Medicine

## 2020-08-30 ENCOUNTER — Ambulatory Visit (INDEPENDENT_AMBULATORY_CARE_PROVIDER_SITE_OTHER): Payer: Medicare Other | Admitting: Internal Medicine

## 2020-08-30 VITALS — BP 124/82 | HR 43 | Temp 97.8°F | Ht 63.0 in | Wt 136.8 lb

## 2020-08-30 DIAGNOSIS — N182 Chronic kidney disease, stage 2 (mild): Secondary | ICD-10-CM | POA: Diagnosis not present

## 2020-08-30 DIAGNOSIS — E1122 Type 2 diabetes mellitus with diabetic chronic kidney disease: Secondary | ICD-10-CM | POA: Diagnosis not present

## 2020-08-30 DIAGNOSIS — E7800 Pure hypercholesterolemia, unspecified: Secondary | ICD-10-CM | POA: Diagnosis not present

## 2020-08-30 DIAGNOSIS — Z79899 Other long term (current) drug therapy: Secondary | ICD-10-CM

## 2020-08-30 DIAGNOSIS — R252 Cramp and spasm: Secondary | ICD-10-CM

## 2020-08-30 NOTE — Progress Notes (Signed)
I,Katawbba Wiggins,acting as a Education administrator for [name], MD.,have documented all relevant documentation on the behalf of [name], MD,as directed by  [name], MD while in the presence of [name], MD.  This visit occurred during the SARS-CoV-2 public health emergency.  Safety protocols were in place, including screening questions prior to the visit, additional usage of staff PPE, and extensive cleaning of exam room while observing appropriate contact time as indicated for disinfecting solutions.  Subjective:     Patient ID: Ashley Lyons , female    DOB: 11/26/1940 , 80 y.o.   MRN: [id number]   Chief Complaint  Patient presents with  . Diabetes  . Hyperlipidemia    HPI  She presents today for diabetes and cholesterol check.  The pt said that she was experiencing cramping in her legs so she started taking 1/2 tab of her pravastatin 40 mg medication and it seems to be better.   Diabetes She presents for her follow-up diabetic visit. She has type 2 diabetes mellitus. Her disease course has been stable. There are no hypoglycemic associated symptoms. There are no hypoglycemic complications. Risk factors for coronary artery disease include dyslipidemia, diabetes mellitus, sedentary lifestyle and post-menopausal. She is following a generally healthy diet. She participates in exercise intermittently. There is no change in her home blood glucose trend. Her breakfast blood glucose is taken between 7-8 am. Her breakfast blood glucose range is generally 90-110 mg/dl. An ACE inhibitor/angiotensin II receptor blocker is not being taken. Eye exam is current.     Past Medical History:  Diagnosis Date  . Allergy   . Diabetes mellitus   . GERD (gastroesophageal reflux disease)   . Glaucoma    right eye  . Hyperlipidemia   . Leukopenia      Family History  Problem Relation Age of Onset  . Prostate cancer Father   . Diabetes Mother   . Prostate cancer Brother   . Kidney  disease Sister   . Liver disease Brother   . Colon cancer Brother   . Esophageal cancer Neg Hx   . Rectal cancer Neg Hx   . Stomach cancer Neg Hx      Current Outpatient Medications:  .  Barberry-Oreg Grape-Goldenseal (BERBERINE COMPLEX PO), Take 500 mg by mouth., Disp: , Rfl:  .  BIOTIN PO, Take 100 mg by mouth. , Disp: , Rfl:  .  CALCIUM PO, Take 1,000 mg by mouth. , Disp: , Rfl:  .  Cholecalciferol (VITAMIN D3) 50 MCG (2000 UT) capsule, Take 2,000 Units by mouth daily., Disp: , Rfl:  .  co-enzyme Q-10 30 MG capsule, Take 30 mg by mouth daily. , Disp: , Rfl:  .  DiphenhydrAMINE HCl (BENADRYL ALLERGY PO), Take 25 mg by mouth as needed., Disp: , Rfl:  .  hydrocortisone 2.5 % cream, Apply to face twice daily for 2 wks, then daily for 2 wks, then daily as needed for itching, Disp: , Rfl:  .  Multiple Vitamin (MULTIVITAMIN) tablet, Take 1 tablet by mouth daily., Disp: , Rfl:  .  omega-3 acid ethyl esters (LOVAZA) 1 g capsule, Take by mouth daily. , Disp: , Rfl:  .  pravastatin (PRAVACHOL) 40 MG tablet, Take 1 tablet (40 mg total) by mouth daily. (Patient taking differently: Take 40 mg by mouth daily. 1/2 tab), Disp: 90 tablet, Rfl: 3 .  SELENIUM PO, Take by mouth. 200 mg, Disp: , Rfl:  .  SPIRULINA PO, Take by mouth.  As needed, Disp: , Rfl:  .  timolol (TIMOPTIC) 0.5 % ophthalmic solution, , Disp: , Rfl:  .  vitamin C (ASCORBIC ACID) 500 MG tablet, Take 500 mg by mouth daily., Disp: , Rfl:    Allergies  Allergen Reactions  . Almond Oil Hives    Almonds  . Bean Pod Extract Hives    String Beans  . Corn-Containing Products Hives  . Peanuts [Peanut Oil] Hives  . Sodium Laureth Sulfate Hives  . Soy Allergy Hives  . Strawberry Extract Hives     Review of Systems  Constitutional: Negative.   Respiratory: Negative.   Cardiovascular: Negative.   Gastrointestinal: Negative.   Psychiatric/Behavioral: Negative.   All other systems reviewed and are negative.    Today's Vitals    08/30/20 0845  BP: 124/82  Pulse: (!) 43  Temp: 97.8 F (36.6 C)  TempSrc: Oral  Weight: 136 lb 12.8 oz (62.1 kg)  Height: 5' 3"  (1.6 m)  PainSc: 9   PainLoc: Shoulder   Body mass index is 24.23 kg/m.   Objective:  Physical Exam Vitals and nursing note reviewed.  Constitutional:      Appearance: Normal appearance.  HENT:     Head: Normocephalic and atraumatic.  Cardiovascular:     Rate and Rhythm: Normal rate and regular rhythm.     Heart sounds: Normal heart sounds.  Pulmonary:     Breath sounds: Normal breath sounds.  Skin:    General: Skin is warm.  Neurological:     General: No focal deficit present.     Mental Status: She is alert and oriented to person, place, and time.         Assessment And Plan:     1. Type 2 diabetes mellitus with stage 2 chronic kidney disease, without long-term current use of insulin ([place]) Comments: I will check an a1c today. Encouraged to avoid sugary beverages, including diet drinks. She was given samples of Libre sensors to try. She downloaded app to her Iphone. She is aware her insurance may not pay for this since she is not on insulin.  - BMP8+EGFR - Hemoglobin A1c  2. Pure hypercholesterolemia Comments: She agrees to increase back to 46m pravastatin taken MWFSat. She will let me know if she is unable to tolerate this dosing schedule.   3. Leg cramps Comments: She is encouraged to stay well hydrated and continue with vitamin D supplementation.  - CK, total  4. Drug therapy - Vitamin B12 - ALT   Patient was given opportunity to ask questions. Patient verbalized understanding of the plan and was able to repeat key elements of the plan. All questions were answered to their satisfaction.  RMaximino Greenland MD   I, RMaximino Greenland MD, have reviewed all documentation for this visit. The documentation on 08/31/20 for the exam, diagnosis, procedures, and orders are all accurate and complete.  THE PATIENT IS ENCOURAGED TO PRACTICE  SOCIAL DISTANCING DUE TO THE COVID-19 PANDEMIC.

## 2020-08-30 NOTE — Patient Instructions (Signed)
Diabetes Mellitus and Foot Care Foot care is an important part of your health, especially when you have diabetes. Diabetes may cause you to have problems because of poor blood flow (circulation) to your feet and legs, which can cause your skin to:  Become thinner and drier.  Break more easily.  Heal more slowly.  Peel and crack. You may also have nerve damage (neuropathy) in your legs and feet, causing decreased feeling in them. This means that you may not notice minor injuries to your feet that could lead to more serious problems. Noticing and addressing any potential problems early is the best way to prevent future foot problems. How to care for your feet Foot hygiene  Wash your feet daily with warm water and mild soap. Do not use hot water. Then, pat your feet and the areas between your toes until they are completely dry. Do not soak your feet as this can dry your skin.  Trim your toenails straight across. Do not dig under them or around the cuticle. File the edges of your nails with an emery board or nail file.  Apply a moisturizing lotion or petroleum jelly to the skin on your feet and to dry, brittle toenails. Use lotion that does not contain alcohol and is unscented. Do not apply lotion between your toes.   Shoes and socks  Wear clean socks or stockings every day. Make sure they are not too tight. Do not wear knee-high stockings since they may decrease blood flow to your legs.  Wear shoes that fit properly and have enough cushioning. Always look in your shoes before you put them on to be sure there are no objects inside.  To break in new shoes, wear them for just a few hours a day. This prevents injuries on your feet. Wounds, scrapes, corns, and calluses  Check your feet daily for blisters, cuts, bruises, sores, and redness. If you cannot see the bottom of your feet, use a mirror or ask someone for help.  Do not cut corns or calluses or try to remove them with medicine.  If you  find a minor scrape, cut, or break in the skin on your feet, keep it and the skin around it clean and dry. You may clean these areas with mild soap and water. Do not clean the area with peroxide, alcohol, or iodine.  If you have a wound, scrape, corn, or callus on your foot, look at it several times a day to make sure it is healing and not infected. Check for: ? Redness, swelling, or pain. ? Fluid or blood. ? Warmth. ? Pus or a bad smell.   General tips  Do not cross your legs. This may decrease blood flow to your feet.  Do not use heating pads or hot water bottles on your feet. They may burn your skin. If you have lost feeling in your feet or legs, you may not know this is happening until it is too late.  Protect your feet from hot and cold by wearing shoes, such as at the beach or on hot pavement.  Schedule a complete foot exam at least once a year (annually) or more often if you have foot problems. Report any cuts, sores, or bruises to your health care provider immediately. Where to find more information  American Diabetes Association: www.diabetes.org  Association of Diabetes Care & Education Specialists: www.diabeteseducator.org Contact a health care provider if:  You have a medical condition that increases your risk of infection and   you have any cuts, sores, or bruises on your feet.  You have an injury that is not healing.  You have redness on your legs or feet.  You feel burning or tingling in your legs or feet.  You have pain or cramps in your legs and feet.  Your legs or feet are numb.  Your feet always feel cold.  You have pain around any toenails. Get help right away if:  You have a wound, scrape, corn, or callus on your foot and: ? You have pain, swelling, or redness that gets worse. ? You have fluid or blood coming from the wound, scrape, corn, or callus. ? Your wound, scrape, corn, or callus feels warm to the touch. ? You have pus or a bad smell coming from  the wound, scrape, corn, or callus. ? You have a fever. ? You have a red line going up your leg. Summary  Check your feet every day for blisters, cuts, bruises, sores, and redness.  Apply a moisturizing lotion or petroleum jelly to the skin on your feet and to dry, brittle toenails.  Wear shoes that fit properly and have enough cushioning.  If you have foot problems, report any cuts, sores, or bruises to your health care provider immediately.  Schedule a complete foot exam at least once a year (annually) or more often if you have foot problems. This information is not intended to replace advice given to you by your health care provider. Make sure you discuss any questions you have with your health care provider. Document Revised: 02/11/2020 Document Reviewed: 02/11/2020 Elsevier Patient Education  2021 Elsevier Inc.  

## 2020-08-31 LAB — BMP8+EGFR
BUN/Creatinine Ratio: 15 (ref 12–28)
BUN: 13 mg/dL (ref 8–27)
CO2: 25 mmol/L (ref 20–29)
Calcium: 9.3 mg/dL (ref 8.7–10.3)
Chloride: 104 mmol/L (ref 96–106)
Creatinine, Ser: 0.84 mg/dL (ref 0.57–1.00)
GFR calc Af Amer: 75 mL/min/{1.73_m2} (ref >59)
GFR calc non Af Amer: 65 mL/min/{1.73_m2} (ref >59)
Glucose: 119 mg/dL — ABNORMAL HIGH (ref 65–99)
Potassium: 4.6 mmol/L (ref 3.5–5.2)
Sodium: 141 mmol/L (ref 134–144)

## 2020-08-31 LAB — VITAMIN B12: Vitamin B-12: 964 pg/mL (ref 232–1245)

## 2020-08-31 LAB — HEMOGLOBIN A1C
Est. average glucose Bld gHb Est-mCnc: 143 mg/dL
Hgb A1c MFr Bld: 6.6 % — ABNORMAL HIGH (ref 4.8–5.6)

## 2020-08-31 LAB — CK: Total CK: 62 U/L (ref 26–161)

## 2020-08-31 LAB — ALT: ALT: 13 IU/L (ref 0–32)

## 2020-09-16 DIAGNOSIS — H401122 Primary open-angle glaucoma, left eye, moderate stage: Secondary | ICD-10-CM | POA: Diagnosis not present

## 2020-09-16 DIAGNOSIS — H401111 Primary open-angle glaucoma, right eye, mild stage: Secondary | ICD-10-CM | POA: Diagnosis not present

## 2020-09-16 DIAGNOSIS — H02055 Trichiasis without entropian left lower eyelid: Secondary | ICD-10-CM | POA: Diagnosis not present

## 2020-12-20 ENCOUNTER — Encounter: Payer: Self-pay | Admitting: Internal Medicine

## 2020-12-20 ENCOUNTER — Other Ambulatory Visit: Payer: Self-pay

## 2020-12-20 ENCOUNTER — Ambulatory Visit (INDEPENDENT_AMBULATORY_CARE_PROVIDER_SITE_OTHER): Payer: Medicare Other | Admitting: Internal Medicine

## 2020-12-20 VITALS — BP 122/70 | HR 43 | Temp 98.5°F | Ht 63.0 in | Wt 134.2 lb

## 2020-12-20 DIAGNOSIS — E7800 Pure hypercholesterolemia, unspecified: Secondary | ICD-10-CM

## 2020-12-20 DIAGNOSIS — E1122 Type 2 diabetes mellitus with diabetic chronic kidney disease: Secondary | ICD-10-CM

## 2020-12-20 DIAGNOSIS — Z79899 Other long term (current) drug therapy: Secondary | ICD-10-CM | POA: Diagnosis not present

## 2020-12-20 DIAGNOSIS — R799 Abnormal finding of blood chemistry, unspecified: Secondary | ICD-10-CM | POA: Diagnosis not present

## 2020-12-20 DIAGNOSIS — N182 Chronic kidney disease, stage 2 (mild): Secondary | ICD-10-CM | POA: Diagnosis not present

## 2020-12-20 NOTE — Progress Notes (Signed)
I,Katawbba Wiggins,acting as a Education administrator for [name], MD.,have documented all relevant documentation on the behalf of [name], MD,as directed by  [name], MD while in the presence of [name], MD.  This visit occurred during the SARS-CoV-2 public health emergency.  Safety protocols were in place, including screening questions prior to the visit, additional usage of staff PPE, and extensive cleaning of exam room while observing appropriate contact time as indicated for disinfecting solutions.  Subjective:     Patient ID: Ashley Lyons , female    DOB: 08-14-1940 , 80 y.o.   MRN: [id number]   Chief Complaint  Patient presents with  . Diabetes  . Hyperlipidemia    HPI  She presents today for diabetes and cholesterol check.  She is no longer taking meds for diabetes. She has been able to control it with lifestyle changes. She suspects things are "out of whack" right now. States her life has been turned "upside down'. She did not elaborate on what is going on.   Diabetes She presents for her follow-up diabetic visit. She has type 2 diabetes mellitus. Her disease course has been stable. There are no hypoglycemic associated symptoms. There are no hypoglycemic complications. Risk factors for coronary artery disease include dyslipidemia, diabetes mellitus, sedentary lifestyle and post-menopausal. She is following a generally healthy diet. She participates in exercise intermittently. There is no change in her home blood glucose trend. Her breakfast blood glucose is taken between 7-8 am. Her breakfast blood glucose range is generally 90-110 mg/dl. An ACE inhibitor/angiotensin II receptor blocker is not being taken. Eye exam is current.  Hyperlipidemia This is a chronic problem. The problem is controlled. Exacerbating diseases include diabetes. Current antihyperlipidemic treatment includes statins. Risk factors for coronary artery disease include diabetes mellitus, dyslipidemia  and post-menopausal.     Past Medical History:  Diagnosis Date  . Allergy   . Diabetes mellitus   . GERD (gastroesophageal reflux disease)   . Glaucoma    right eye  . Hyperlipidemia   . Leukopenia      Family History  Problem Relation Age of Onset  . Prostate cancer Father   . Diabetes Mother   . Prostate cancer Brother   . Kidney disease Sister   . Liver disease Brother   . Colon cancer Brother   . Esophageal cancer Neg Hx   . Rectal cancer Neg Hx   . Stomach cancer Neg Hx      Current Outpatient Medications:  .  Barberry-Oreg Grape-Goldenseal (BERBERINE COMPLEX PO), Take 500 mg by mouth., Disp: , Rfl:  .  BIOTIN PO, Take 100 mg by mouth. , Disp: , Rfl:  .  CALCIUM PO, Take 1,000 mg by mouth. , Disp: , Rfl:  .  Cholecalciferol (VITAMIN D3) 50 MCG (2000 UT) capsule, Take 2,000 Units by mouth daily., Disp: , Rfl:  .  co-enzyme Q-10 30 MG capsule, Take 30 mg by mouth daily. , Disp: , Rfl:  .  DiphenhydrAMINE HCl (BENADRYL ALLERGY PO), Take 25 mg by mouth as needed., Disp: , Rfl:  .  hydrocortisone 2.5 % cream, Apply to face twice daily for 2 wks, then daily for 2 wks, then daily as needed for itching, Disp: , Rfl:  .  Multiple Vitamin (MULTIVITAMIN) tablet, Take 1 tablet by mouth daily., Disp: , Rfl:  .  omega-3 acid ethyl esters (LOVAZA) 1 g capsule, Take by mouth daily. , Disp: , Rfl:  .  pravastatin (PRAVACHOL) 40  MG tablet, Take 1 tablet (40 mg total) by mouth daily. (Patient taking differently: Take 40 mg by mouth daily. 1 tab Monday, Wednesday, Friday, Saturday), Disp: 90 tablet, Rfl: 3 .  SELENIUM PO, Take by mouth. 200 mg, Disp: , Rfl:  .  SPIRULINA PO, Take by mouth. As needed, Disp: , Rfl:  .  timolol (TIMOPTIC) 0.5 % ophthalmic solution, , Disp: , Rfl:  .  vitamin C (ASCORBIC ACID) 500 MG tablet, Take 500 mg by mouth daily., Disp: , Rfl:    Allergies  Allergen Reactions  . Almond Oil Hives    Almonds  . Bean Pod Extract Hives    String Beans  .  Corn-Containing Products Hives  . Peanuts [Peanut Oil] Hives  . Sodium Laureth Sulfate Hives  . Soy Allergy Hives  . Strawberry Extract Hives     Review of Systems  Constitutional: Negative.   Respiratory: Negative.   Cardiovascular: Negative.   Gastrointestinal: Negative.   Psychiatric/Behavioral: Negative.   All other systems reviewed and are negative.    Today's Vitals   12/20/20 0855  BP: 122/70  Pulse: (!) 43  Temp: 98.5 F (36.9 C)  TempSrc: Oral  Weight: 134 lb 3.2 oz (60.9 kg)  Height: 5' 3" (1.6 m)  PainSc: 0-No pain   Body mass index is 23.77 kg/m.  Wt Readings from Last 3 Encounters:  12/20/20 134 lb 3.2 oz (60.9 kg)  08/30/20 136 lb 12.8 oz (62.1 kg)  06/15/20 141 lb (64 kg)   BP Readings from Last 3 Encounters:  12/20/20 122/70  08/30/20 124/82  06/15/20 140/74   Objective:  Physical Exam Vitals and nursing note reviewed.  Constitutional:      Appearance: Normal appearance.  HENT:     Head: Normocephalic and atraumatic.     Nose:     Comments: Masked     Mouth/Throat:     Comments: Masked  Cardiovascular:     Rate and Rhythm: Normal rate and regular rhythm.     Heart sounds: Normal heart sounds.  Pulmonary:     Effort: Pulmonary effort is normal.     Breath sounds: Normal breath sounds.  Musculoskeletal:     Cervical back: Normal range of motion.  Skin:    General: Skin is warm.  Neurological:     General: No focal deficit present.     Mental Status: She is alert.  Psychiatric:        Mood and Affect: Mood normal.        Behavior: Behavior normal.         Assessment And Plan:     1. Type 2 diabetes mellitus with stage 2 chronic kidney disease, without long-term current use of insulin ([facility]) Comments: Chronic, I will check labs as listed below.  She will rto in October 2022 for her next physical examination.  - Hemoglobin A1c - CBC no Diff - CMP14+EGFR - Lipid panel  2. Pure hypercholesterolemia Comments: Chronic, I will check  fasting lipid panel. She is encouraged to limit her intake of fried foods and to aim for at least 150 min of exercise per week.  - Lipid panel  3. Drug therapy   Patient was given opportunity to ask questions. Patient verbalized understanding of the plan and was able to repeat key elements of the plan. All questions were answered to their satisfaction.   I, [name], MD, have reviewed all documentation for this visit. The documentation on 12/20/20 for the exam, diagnosis,  procedures, and orders are all accurate and complete.   IF YOU HAVE BEEN REFERRED TO A SPECIALIST, IT MAY TAKE 1-2 WEEKS TO SCHEDULE/PROCESS THE REFERRAL. IF YOU HAVE NOT HEARD FROM US/SPECIALIST IN TWO WEEKS, PLEASE GIVE [place] A CALL AT [phone].   THE PATIENT IS ENCOURAGED TO PRACTICE SOCIAL DISTANCING DUE TO THE COVID-19 PANDEMIC.

## 2020-12-20 NOTE — Patient Instructions (Signed)
Diabetes Mellitus and Foot Care Foot care is an important part of your health, especially when you have diabetes. Diabetes may cause you to have problems because of poor blood flow (circulation) to your feet and legs, which can cause your skin to:  Become thinner and drier.  Break more easily.  Heal more slowly.  Peel and crack. You may also have nerve damage (neuropathy) in your legs and feet, causing decreased feeling in them. This means that you may not notice minor injuries to your feet that could lead to more serious problems. Noticing and addressing any potential problems early is the best way to prevent future foot problems. How to care for your feet Foot hygiene  Wash your feet daily with warm water and mild soap. Do not use hot water. Then, pat your feet and the areas between your toes until they are completely dry. Do not soak your feet as this can dry your skin.  Trim your toenails straight across. Do not dig under them or around the cuticle. File the edges of your nails with an emery board or nail file.  Apply a moisturizing lotion or petroleum jelly to the skin on your feet and to dry, brittle toenails. Use lotion that does not contain alcohol and is unscented. Do not apply lotion between your toes.   Shoes and socks  Wear clean socks or stockings every day. Make sure they are not too tight. Do not wear knee-high stockings since they may decrease blood flow to your legs.  Wear shoes that fit properly and have enough cushioning. Always look in your shoes before you put them on to be sure there are no objects inside.  To break in new shoes, wear them for just a few hours a day. This prevents injuries on your feet. Wounds, scrapes, corns, and calluses  Check your feet daily for blisters, cuts, bruises, sores, and redness. If you cannot see the bottom of your feet, use a mirror or ask someone for help.  Do not cut corns or calluses or try to remove them with medicine.  If you  find a minor scrape, cut, or break in the skin on your feet, keep it and the skin around it clean and dry. You may clean these areas with mild soap and water. Do not clean the area with peroxide, alcohol, or iodine.  If you have a wound, scrape, corn, or callus on your foot, look at it several times a day to make sure it is healing and not infected. Check for: ? Redness, swelling, or pain. ? Fluid or blood. ? Warmth. ? Pus or a bad smell.   General tips  Do not cross your legs. This may decrease blood flow to your feet.  Do not use heating pads or hot water bottles on your feet. They may burn your skin. If you have lost feeling in your feet or legs, you may not know this is happening until it is too late.  Protect your feet from hot and cold by wearing shoes, such as at the beach or on hot pavement.  Schedule a complete foot exam at least once a year (annually) or more often if you have foot problems. Report any cuts, sores, or bruises to your health care provider immediately. Where to find more information  American Diabetes Association: www.diabetes.org  Association of Diabetes Care & Education Specialists: www.diabeteseducator.org Contact a health care provider if:  You have a medical condition that increases your risk of infection and   you have any cuts, sores, or bruises on your feet.  You have an injury that is not healing.  You have redness on your legs or feet.  You feel burning or tingling in your legs or feet.  You have pain or cramps in your legs and feet.  Your legs or feet are numb.  Your feet always feel cold.  You have pain around any toenails. Get help right away if:  You have a wound, scrape, corn, or callus on your foot and: ? You have pain, swelling, or redness that gets worse. ? You have fluid or blood coming from the wound, scrape, corn, or callus. ? Your wound, scrape, corn, or callus feels warm to the touch. ? You have pus or a bad smell coming from  the wound, scrape, corn, or callus. ? You have a fever. ? You have a red line going up your leg. Summary  Check your feet every day for blisters, cuts, bruises, sores, and redness.  Apply a moisturizing lotion or petroleum jelly to the skin on your feet and to dry, brittle toenails.  Wear shoes that fit properly and have enough cushioning.  If you have foot problems, report any cuts, sores, or bruises to your health care provider immediately.  Schedule a complete foot exam at least once a year (annually) or more often if you have foot problems. This information is not intended to replace advice given to you by your health care provider. Make sure you discuss any questions you have with your health care provider. Document Revised: 02/11/2020 Document Reviewed: 02/11/2020 Elsevier Patient Education  2021 Elsevier Inc.  

## 2020-12-21 LAB — CBC
Hematocrit: 39.1 % (ref 34.0–46.6)
Hemoglobin: 13 g/dL (ref 11.1–15.9)
MCH: 34.6 pg — ABNORMAL HIGH (ref 26.6–33.0)
MCHC: 33.2 g/dL (ref 31.5–35.7)
MCV: 104 fL — ABNORMAL HIGH (ref 79–97)
Platelets: 231 10*3/uL (ref 150–450)
RBC: 3.76 x10E6/uL — ABNORMAL LOW (ref 3.77–5.28)
RDW: 12.9 % (ref 11.7–15.4)
WBC: 2.2 10*3/uL — CL (ref 3.4–10.8)

## 2020-12-21 LAB — CMP14+EGFR
ALT: 13 IU/L (ref 0–32)
AST: 20 IU/L (ref 0–40)
Albumin/Globulin Ratio: 1.3 (ref 1.2–2.2)
Albumin: 3.9 g/dL (ref 3.6–4.6)
Alkaline Phosphatase: 51 IU/L (ref 44–121)
BUN/Creatinine Ratio: 14 (ref 12–28)
BUN: 12 mg/dL (ref 8–27)
Bilirubin Total: 0.5 mg/dL (ref 0.0–1.2)
CO2: 24 mmol/L (ref 20–29)
Calcium: 9.5 mg/dL (ref 8.7–10.3)
Chloride: 103 mmol/L (ref 96–106)
Creatinine, Ser: 0.86 mg/dL (ref 0.57–1.00)
Globulin, Total: 3.1 g/dL (ref 1.5–4.5)
Glucose: 107 mg/dL — ABNORMAL HIGH (ref 65–99)
Potassium: 4.5 mmol/L (ref 3.5–5.2)
Sodium: 140 mmol/L (ref 134–144)
Total Protein: 7 g/dL (ref 6.0–8.5)
eGFR: 67 mL/min/{1.73_m2} (ref >59)

## 2020-12-21 LAB — LIPID PANEL
Chol/HDL Ratio: 3 ratio (ref 0.0–4.4)
Cholesterol, Total: 199 mg/dL (ref 100–199)
HDL: 67 mg/dL (ref >39)
LDL Chol Calc (NIH): 120 mg/dL — ABNORMAL HIGH (ref 0–99)
Triglycerides: 68 mg/dL (ref 0–149)
VLDL Cholesterol Cal: 12 mg/dL (ref 5–40)

## 2020-12-21 LAB — HEMOGLOBIN A1C
Est. average glucose Bld gHb Est-mCnc: 140 mg/dL
Hgb A1c MFr Bld: 6.5 % — ABNORMAL HIGH (ref 4.8–5.6)

## 2020-12-23 LAB — B12 AND FOLATE PANEL
Folate: >20 ng/mL (ref >3.0)
Vitamin B-12: 1317 pg/mL — ABNORMAL HIGH (ref 232–1245)

## 2020-12-23 LAB — SPECIMEN STATUS REPORT

## 2021-01-05 ENCOUNTER — Encounter: Payer: Self-pay | Admitting: Internal Medicine

## 2021-03-21 DIAGNOSIS — H401122 Primary open-angle glaucoma, left eye, moderate stage: Secondary | ICD-10-CM | POA: Diagnosis not present

## 2021-03-21 DIAGNOSIS — H26492 Other secondary cataract, left eye: Secondary | ICD-10-CM | POA: Diagnosis not present

## 2021-03-21 DIAGNOSIS — H401111 Primary open-angle glaucoma, right eye, mild stage: Secondary | ICD-10-CM | POA: Diagnosis not present

## 2021-03-21 DIAGNOSIS — E119 Type 2 diabetes mellitus without complications: Secondary | ICD-10-CM | POA: Diagnosis not present

## 2021-03-21 LAB — HM DIABETES EYE EXAM

## 2021-04-26 ENCOUNTER — Other Ambulatory Visit: Payer: Self-pay

## 2021-04-26 ENCOUNTER — Ambulatory Visit (INDEPENDENT_AMBULATORY_CARE_PROVIDER_SITE_OTHER): Payer: Medicare Other

## 2021-04-26 ENCOUNTER — Encounter: Payer: Medicare Other | Admitting: Internal Medicine

## 2021-04-26 VITALS — BP 128/66 | HR 53 | Temp 98.4°F | Ht 59.8 in | Wt 134.4 lb

## 2021-04-26 DIAGNOSIS — Z23 Encounter for immunization: Secondary | ICD-10-CM | POA: Diagnosis not present

## 2021-04-26 DIAGNOSIS — Z0000 Encounter for general adult medical examination without abnormal findings: Secondary | ICD-10-CM

## 2021-04-26 NOTE — Patient Instructions (Signed)
Ashley Lyons , Thank you for taking time to come for your Medicare Wellness Visit. I appreciate your ongoing commitment to your health goals. Please review the following plan we discussed and let me know if I can assist you in the future.   Screening recommendations/referrals: Colonoscopy: not required Mammogram: not required Bone Density: completed 06/23/2018 Recommended yearly ophthalmology/optometry visit for glaucoma screening and checkup Recommended yearly dental visit for hygiene and checkup  Vaccinations: Influenza vaccine: today Pneumococcal vaccine: completed 07/21/2016 Tdap vaccine: completed 10/27/2012, due 10/28/2022 Shingles vaccine: discussed   Covid-19: 12/30/2020, 07/12/2020, 11/10/2019, 10/17/2019  Advanced directives: Please bring a copy of your POA (Power of Attorney) and/or Living Will to your next appointment.   Conditions/risks identified: none  Next appointment: Follow up in one year for your annual wellness visit    Preventive Care 65 Years and Older, Female Preventive care refers to lifestyle choices and visits with your health care provider that can promote health and wellness. What does preventive care include? A yearly physical exam. This is also called an annual well check. Dental exams once or twice a year. Routine eye exams. Ask your health care provider how often you should have your eyes checked. Personal lifestyle choices, including: Daily care of your teeth and gums. Regular physical activity. Eating a healthy diet. Avoiding tobacco and drug use. Limiting alcohol use. Practicing safe sex. Taking low-dose aspirin every day. Taking vitamin and mineral supplements as recommended by your health care provider. What happens during an annual well check? The services and screenings done by your health care provider during your annual well check will depend on your age, overall health, lifestyle risk factors, and family history of disease. Counseling  Your  health care provider may ask you questions about your: Alcohol use. Tobacco use. Drug use. Emotional well-being. Home and relationship well-being. Sexual activity. Eating habits. History of falls. Memory and ability to understand (cognition). Work and work Statistician. Reproductive health. Screening  You may have the following tests or measurements: Height, weight, and BMI. Blood pressure. Lipid and cholesterol levels. These may be checked every 5 years, or more frequently if you are over 46 years old. Skin check. Lung cancer screening. You may have this screening every year starting at age 14 if you have a 30-pack-year history of smoking and currently smoke or have quit within the past 15 years. Fecal occult blood test (FOBT) of the stool. You may have this test every year starting at age 71. Flexible sigmoidoscopy or colonoscopy. You may have a sigmoidoscopy every 5 years or a colonoscopy every 10 years starting at age 26. Hepatitis C blood test. Hepatitis B blood test. Sexually transmitted disease (STD) testing. Diabetes screening. This is done by checking your blood sugar (glucose) after you have not eaten for a while (fasting). You may have this done every 1-3 years. Bone density scan. This is done to screen for osteoporosis. You may have this done starting at age 71. Mammogram. This may be done every 1-2 years. Talk to your health care provider about how often you should have regular mammograms. Talk with your health care provider about your test results, treatment options, and if necessary, the need for more tests. Vaccines  Your health care provider may recommend certain vaccines, such as: Influenza vaccine. This is recommended every year. Tetanus, diphtheria, and acellular pertussis (Tdap, Td) vaccine. You may need a Td booster every 10 years. Zoster vaccine. You may need this after age 49. Pneumococcal 13-valent conjugate (PCV13) vaccine. One dose  is recommended after age  65. Pneumococcal polysaccharide (PPSV23) vaccine. One dose is recommended after age 7. Talk to your health care provider about which screenings and vaccines you need and how often you need them. This information is not intended to replace advice given to you by your health care provider. Make sure you discuss any questions you have with your health care provider. Document Released: 08/19/2015 Document Revised: 04/11/2016 Document Reviewed: 05/24/2015 Elsevier Interactive Patient Education  2017 [place] Prevention in the Home Falls can cause injuries. They can happen to people of all ages. There are many things you can do to make your home safe and to help prevent falls. What can I do on the outside of my home? Regularly fix the edges of walkways and driveways and fix any cracks. Remove anything that might make you trip as you walk through a door, such as a raised step or threshold. Trim any bushes or trees on the path to your home. Use bright outdoor lighting. Clear any walking paths of anything that might make someone trip, such as rocks or tools. Regularly check to see if handrails are loose or broken. Make sure that both sides of any steps have handrails. Any raised decks and porches should have guardrails on the edges. Have any leaves, snow, or ice cleared regularly. Use sand or salt on walking paths during winter. Clean up any spills in your garage right away. This includes oil or grease spills. What can I do in the bathroom? Use night lights. Install grab bars by the toilet and in the tub and shower. Do not use towel bars as grab bars. Use non-skid mats or decals in the tub or shower. If you need to sit down in the shower, use a plastic, non-slip stool. Keep the floor dry. Clean up any water that spills on the floor as soon as it happens. Remove soap buildup in the tub or shower regularly. Attach bath mats securely with double-sided non-slip rug tape. Do not have throw  rugs and other things on the floor that can make you trip. What can I do in the bedroom? Use night lights. Make sure that you have a light by your bed that is easy to reach. Do not use any sheets or blankets that are too big for your bed. They should not hang down onto the floor. Have a firm chair that has side arms. You can use this for support while you get dressed. Do not have throw rugs and other things on the floor that can make you trip. What can I do in the kitchen? Clean up any spills right away. Avoid walking on wet floors. Keep items that you use a lot in easy-to-reach places. If you need to reach something above you, use a strong step stool that has a grab bar. Keep electrical cords out of the way. Do not use floor polish or wax that makes floors slippery. If you must use wax, use non-skid floor wax. Do not have throw rugs and other things on the floor that can make you trip. What can I do with my stairs? Do not leave any items on the stairs. Make sure that there are handrails on both sides of the stairs and use them. Fix handrails that are broken or loose. Make sure that handrails are as long as the stairways. Check any carpeting to make sure that it is firmly attached to the stairs. Fix any carpet that is loose or worn. Avoid  having throw rugs at the top or bottom of the stairs. If you do have throw rugs, attach them to the floor with carpet tape. Make sure that you have a light switch at the top of the stairs and the bottom of the stairs. If you do not have them, ask someone to add them for you. What else can I do to help prevent falls? Wear shoes that: Do not have high heels. Have rubber bottoms. Are comfortable and fit you well. Are closed at the toe. Do not wear sandals. If you use a stepladder: Make sure that it is fully opened. Do not climb a closed stepladder. Make sure that both sides of the stepladder are locked into place. Ask someone to hold it for you, if  possible. Clearly mark and make sure that you can see: Any grab bars or handrails. First and last steps. Where the edge of each step is. Use tools that help you move around (mobility aids) if they are needed. These include: Canes. Walkers. Scooters. Crutches. Turn on the lights when you go into a dark area. Replace any light bulbs as soon as they burn out. Set up your furniture so you have a clear path. Avoid moving your furniture around. If any of your floors are uneven, fix them. If there are any pets around you, be aware of where they are. Review your medicines with your doctor. Some medicines can make you feel dizzy. This can increase your chance of falling. Ask your doctor what other things that you can do to help prevent falls. This information is not intended to replace advice given to you by your health care provider. Make sure you discuss any questions you have with your health care provider. Document Released: 05/19/2009 Document Revised: 12/29/2015 Document Reviewed: 08/27/2014 Elsevier Interactive Patient Education  2017 [employer].

## 2021-04-26 NOTE — Progress Notes (Signed)
This visit occurred during the SARS-CoV-2 public health emergency.  Safety protocols were in place, including screening questions prior to the visit, additional usage of staff PPE, and extensive cleaning of exam room while observing appropriate contact time as indicated for disinfecting solutions.  Subjective:   Ashley Lyons is a 80 y.o. female who presents for Medicare Annual (Subsequent) preventive examination.  Review of Systems     Cardiac Risk Factors include: advanced age (>13men, >51 women);diabetes mellitus;sedentary lifestyle     Objective:    Today's Vitals   04/26/21 0843  BP: 128/66  Pulse: (!) 53  Temp: 98.4 F (36.9 C)  TempSrc: Oral  SpO2: 96%  Weight: 134 lb 6.4 oz (61 kg)  Height: 4' 11.8" (1.519 m)   Body mass index is 26.42 kg/m.  Advanced Directives 04/26/2021 04/20/2020 04/15/2019  Does Patient Have a Medical Advance Directive? Yes Yes Yes  Type of Paramedic of [place];Living will [place];Living will [place];Living will  Copy of [place] in Chart? No - copy requested No - copy requested No - copy requested    Current Medications (verified) Outpatient Encounter Medications as of 04/26/2021  Medication Sig   Barberry-Oreg Grape-Goldenseal (BERBERINE COMPLEX PO) Take 500 mg by mouth.   BIOTIN PO Take 100 mg by mouth.    CALCIUM PO Take 1,000 mg by mouth.    Cholecalciferol (VITAMIN D3) 50 MCG (2000 UT) capsule Take 2,000 Units by mouth daily.   co-enzyme Q-10 30 MG capsule Take 30 mg by mouth daily.    DiphenhydrAMINE HCl (BENADRYL ALLERGY PO) Take 25 mg by mouth as needed.   hydrocortisone 2.5 % cream Apply to face twice daily for 2 wks, then daily for 2 wks, then daily as needed for itching   Multiple Vitamin (MULTIVITAMIN) tablet Take 1 tablet by mouth daily.   omega-3 acid ethyl esters (LOVAZA) 1 g capsule Take by mouth daily.    pravastatin (PRAVACHOL) 40 MG tablet  Take 1 tablet (40 mg total) by mouth daily. (Patient taking differently: Take 40 mg by mouth daily. 1 tab Monday, Wednesday, Friday, Saturday)   SELENIUM PO Take by mouth. 200 mg   SPIRULINA PO Take by mouth. As needed   timolol (TIMOPTIC) 0.5 % ophthalmic solution    vitamin C (ASCORBIC ACID) 500 MG tablet Take 500 mg by mouth daily.   No facility-administered encounter medications on file as of 04/26/2021.    Allergies (verified) Almond oil, Bean pod extract, Corn-containing products, Peanuts [peanut oil], Sodium laureth sulfate, Soy allergy, and Strawberry extract   History: Past Medical History:  Diagnosis Date   Allergy    Diabetes mellitus    GERD (gastroesophageal reflux disease)    Glaucoma    right eye   Hyperlipidemia    Leukopenia    Past Surgical History:  Procedure Laterality Date   APPENDECTOMY     COLONOSCOPY     TONSILLECTOMY     TUBAL LIGATION     VAGINAL HYSTERECTOMY     Family History  Problem Relation Age of Onset   Prostate cancer Father    Diabetes Mother    Prostate cancer Brother    Kidney disease Sister    Liver disease Brother    Colon cancer Brother    Esophageal cancer Neg Hx    Rectal cancer Neg Hx    Stomach cancer Neg Hx    Social History   Socioeconomic History   Marital  status: Married    Spouse name: Not on file   Number of children: Not on file   Years of education: Not on file   Highest education level: Not on file  Occupational History   Occupation: retired  Tobacco Use   Smoking status: Never   Smokeless tobacco: Never  Vaping Use   Vaping Use: Never used  Substance and Sexual Activity   Alcohol use: Not Currently    Comment: wine on occasion   Drug use: No   Sexual activity: Not Currently  Other Topics Concern   Not on file  Social History Narrative   Not on file   Social Determinants of Health   Financial Resource Strain: Low Risk    Difficulty of Paying Living Expenses: Not hard at all  Food Insecurity: No  Food Insecurity   Worried About Charity fundraiser in the Last Year: Never true   [place] in the Last Year: Never true  Transportation Needs: No Transportation Needs   Lack of Transportation (Medical): No   Lack of Transportation (Non-Medical): No  Physical Activity: Inactive   Days of Exercise per Week: 0 days   Minutes of Exercise per Session: 0 min  Stress: No Stress Concern Present   Feeling of Stress : Only a little  Social Connections: Not on file    Tobacco Counseling Counseling given: Not Answered   Clinical Intake:  Pre-visit preparation completed: Yes  Pain : No/denies pain     Nutritional Status: BMI 25 -29 Overweight Nutritional Risks: None Diabetes: Yes  How often do you need to have someone help you when you read instructions, pamphlets, or other written materials from your doctor or pharmacy?: 1 - Never What is the last grade level you completed in school?: masters degree  Diabetic? Yes Nutrition Risk Assessment:  Has the patient had any N/V/D within the last 2 months?  No  Does the patient have any non-healing wounds?  No  Has the patient had any unintentional weight loss or weight gain?  No   Diabetes:  Is the patient diabetic?  Yes  If diabetic, was a CBG obtained today?  No  Did the patient bring in their glucometer from home?  No  How often do you monitor your CBG's? Does not.   Financial Strains and Diabetes Management:  Are you having any financial strains with the device, your supplies or your medication? No .  Does the patient want to be seen by Chronic Care Management for management of their diabetes?  No  Would the patient like to be referred to a Nutritionist or for Diabetic Management?  No   Diabetic Exams:  Diabetic Eye Exam: Completed 03/2021 Diabetic Foot Exam: Overdue, Pt has been advised about the importance in completing this exam. Pt is scheduled for diabetic foot exam on next appointment.   Interpreter Needed?:  No  Information entered by :: NAllen LPN   Activities of Daily Living In your present state of health, do you have any difficulty performing the following activities: 04/26/2021  Hearing? N  Vision? N  Difficulty concentrating or making decisions? N  Walking or climbing stairs? N  Dressing or bathing? N  Doing errands, shopping? N  Preparing Food and eating ? N  Using the Toilet? N  In the past six months, have you accidently leaked urine? Y  Comment if held too long  Do you have problems with loss of bowel control? N  Managing your Medications? N  Managing your Finances? N  Housekeeping or managing your Housekeeping? N  Some recent data might be hidden    Patient Care Team: Glendale Chard, MD as PCP - General (Internal Medicine) Skeet Latch, MD as PCP - Cardiology (Cardiology)  Indicate any recent Medical Services you may have received from other than [facility] providers in the past year (date may be approximate).     Assessment:   This is a routine wellness examination for [place].  Hearing/Vision screen No results found.  Dietary issues and exercise activities discussed: Current Exercise Habits: The patient does not participate in regular exercise at present   Goals Addressed             This Visit's Progress    Patient Stated       04/26/2021 lose weight and exercise more       Depression Screen PHQ 2/9 Scores 04/26/2021 04/20/2020 04/15/2019 11/12/2018 07/15/2018 08/03/2015 05/16/2015  PHQ - 2 Score 0 0 0 0 0 0 0  PHQ- 9 Score - - 0 - - - -    Fall Risk Fall Risk  04/26/2021 04/20/2020 08/20/2019 04/15/2019 11/12/2018  Falls in the past year? 1 0 0 0 1  Comment tripped - - - -  Number falls in past yr: 0 - - - 0  Injury with Fall? 0 - - - 0  Risk for fall due to : Medication side effect No Fall Risks - - -  Follow up Falls evaluation completed;Education provided;Falls prevention discussed Falls evaluation completed;Education provided;Falls prevention discussed -  Falls evaluation completed;Education provided;Falls prevention discussed -    FALL RISK PREVENTION PERTAINING TO THE HOME:  Any stairs in or around the home? Yes  If so, are there any without handrails? No  Home free of loose throw rugs in walkways, pet beds, electrical cords, etc? Yes  Adequate lighting in your home to reduce risk of falls? Yes   ASSISTIVE DEVICES UTILIZED TO PREVENT FALLS:  Life alert? No  Use of a cane, walker or w/c? No  Grab bars in the bathroom? Yes  Shower chair or bench in shower? No  Elevated toilet seat or a handicapped toilet? Yes   TIMED UP AND GO:  Was the test performed? No .    Gait steady and fast without use of assistive device  Cognitive Function:     6CIT Screen 04/26/2021 04/20/2020 04/15/2019  What Year? 0 points 0 points 0 points  What month? 0 points 0 points 0 points  What time? 0 points 0 points 0 points  Count back from 20 0 points 2 points 0 points  Months in reverse 0 points 0 points 0 points  Repeat phrase 2 points 2 points 0 points  Total Score 2 4 0    Immunizations Immunization History  Administered Date(s) Administered   DTaP 10/27/2012   Fluad Quad(high Dose 65+) 04/20/2020   Influenza, High Dose Seasonal PF 05/05/2018, 04/15/2019   Influenza-Unspecified 05/15/2018   PFIZER(Purple Top)SARS-COV-2 Vaccination 10/17/2019, 11/10/2019, 07/12/2020, 12/30/2020   Pneumococcal Conjugate-13 07/11/2016   Pneumococcal-Unspecified 10/27/2010, 07/21/2016    TDAP status: Up to date  Flu Vaccine status: Completed at today's visit  Pneumococcal vaccine status: Up to date  Covid-19 vaccine status: Completed vaccines  Qualifies for Shingles Vaccine? Yes   Zostavax completed No   Shingrix Completed?: No.    Education has been provided regarding the importance of this vaccine. Patient has been advised to call insurance company to determine out of pocket expense if they  have not yet received this vaccine. Advised may also receive  vaccine at local pharmacy or Health Dept. Verbalized acceptance and understanding.  Screening Tests Health Maintenance  Topic Date Due   Zoster Vaccines- Shingrix (1 of 2) Never done   OPHTHALMOLOGY EXAM  02/10/2021   INFLUENZA VACCINE  03/06/2021   FOOT EXAM  04/20/2021   URINE MICROALBUMIN  04/20/2021   COVID-19 Vaccine (5 - Booster for Pfizer series) 05/02/2021   HEMOGLOBIN A1C  06/22/2021   TETANUS/TDAP  10/28/2022   DEXA SCAN  Completed   HPV VACCINES  Aged Out    Health Maintenance  Health Maintenance Due  Topic Date Due   Zoster Vaccines- Shingrix (1 of 2) Never done   OPHTHALMOLOGY EXAM  02/10/2021   INFLUENZA VACCINE  03/06/2021   FOOT EXAM  04/20/2021   URINE MICROALBUMIN  04/20/2021    Colorectal cancer screening: No longer required.   Mammogram status: No longer required due to age.  Bone Density status: Completed 06/23/2018.   Lung Cancer Screening: (Low Dose CT Chest recommended if Age 31-80 years, 30 pack-year currently smoking OR have quit w/in 15years.) does not qualify.   Lung Cancer Screening Referral: no  Additional Screening:  Hepatitis C Screening: does not qualify;   Vision Screening: Recommended annual ophthalmology exams for early detection of glaucoma and other disorders of the eye. Is the patient up to date with their annual eye exam?  Yes  Who is the provider or what is the name of the office in which the patient attends annual eye exams? Dr. Jerline Pain If pt is not established with a provider, would they like to be referred to a provider to establish care? No .   Dental Screening: Recommended annual dental exams for proper oral hygiene  Community Resource Referral / Chronic Care Management: CRR required this visit?  No   CCM required this visit?  No      Plan:     I have personally reviewed and noted the following in the patient's chart:   Medical and social history Use of alcohol, tobacco or illicit drugs  Current medications  and supplements including opioid prescriptions.  Functional ability and status Nutritional status Physical activity Advanced directives List of other physicians Hospitalizations, surgeries, and ER visits in previous 12 months Vitals Screenings to include cognitive, depression, and falls Referrals and appointments  In addition, I have reviewed and discussed with patient certain preventive protocols, quality metrics, and best practice recommendations. A written personalized care plan for preventive services as well as general preventive health recommendations were provided to patient.     Kellie Simmering, LPN   [id number]   Nurse Notes:

## 2021-05-01 ENCOUNTER — Encounter: Payer: Self-pay | Admitting: Internal Medicine

## 2021-05-31 ENCOUNTER — Encounter: Payer: Self-pay | Admitting: Internal Medicine

## 2021-05-31 ENCOUNTER — Ambulatory Visit (INDEPENDENT_AMBULATORY_CARE_PROVIDER_SITE_OTHER): Payer: Medicare Other | Admitting: Internal Medicine

## 2021-05-31 ENCOUNTER — Other Ambulatory Visit: Payer: Self-pay | Admitting: Internal Medicine

## 2021-05-31 ENCOUNTER — Other Ambulatory Visit: Payer: Self-pay

## 2021-05-31 VITALS — BP 136/82 | HR 48 | Temp 98.7°F | Ht 59.8 in | Wt 133.8 lb

## 2021-05-31 DIAGNOSIS — Z23 Encounter for immunization: Secondary | ICD-10-CM | POA: Diagnosis not present

## 2021-05-31 DIAGNOSIS — Z0000 Encounter for general adult medical examination without abnormal findings: Secondary | ICD-10-CM

## 2021-05-31 DIAGNOSIS — E1122 Type 2 diabetes mellitus with diabetic chronic kidney disease: Secondary | ICD-10-CM

## 2021-05-31 DIAGNOSIS — E7800 Pure hypercholesterolemia, unspecified: Secondary | ICD-10-CM

## 2021-05-31 DIAGNOSIS — N182 Chronic kidney disease, stage 2 (mild): Secondary | ICD-10-CM | POA: Diagnosis not present

## 2021-05-31 LAB — POCT URINALYSIS DIPSTICK
Bilirubin, UA: NEGATIVE
Blood, UA: NEGATIVE
Glucose, UA: NEGATIVE
Ketones, UA: NEGATIVE
Nitrite, UA: NEGATIVE
Protein, UA: NEGATIVE
Spec Grav, UA: 1.02 (ref 1.010–1.025)
Urobilinogen, UA: 0.2 E.U./dL
pH, UA: 7 (ref 5.0–8.0)

## 2021-05-31 LAB — POCT UA - MICROALBUMIN
Albumin/Creatinine Ratio, Urine, POC: <30
Creatinine, POC: 200 mg/dL
Microalbumin Ur, POC: 10 mg/L

## 2021-05-31 MED ORDER — ZOSTER VAC RECOMB ADJUVANTED 50 MCG/0.5ML IM SUSR: 0.5000 mL | Freq: Once | INTRAMUSCULAR | 0 refills | Status: AC

## 2021-05-31 NOTE — Patient Instructions (Signed)
Health Maintenance, Female Adopting a healthy lifestyle and getting preventive care are important in promoting health and wellness. Ask your health care provider about: The right schedule for you to have regular tests and exams. Things you can do on your own to prevent diseases and keep yourself healthy. What should I know about diet, weight, and exercise? Eat a healthy diet  Eat a diet that includes plenty of vegetables, fruits, low-fat dairy products, and lean protein. Do not eat a lot of foods that are high in solid fats, added sugars, or sodium. Maintain a healthy weight Body mass index (BMI) is used to identify weight problems. It estimates body fat based on height and weight. Your health care provider can help determine your BMI and help you achieve or maintain a healthy weight. Get regular exercise Get regular exercise. This is one of the most important things you can do for your health. Most adults should: Exercise for at least 150 minutes each week. The exercise should increase your heart rate and make you sweat (moderate-intensity exercise). Do strengthening exercises at least twice a week. This is in addition to the moderate-intensity exercise. Spend less time sitting. Even light physical activity can be beneficial. Watch cholesterol and blood lipids Have your blood tested for lipids and cholesterol at 82 years of age, then have this test every 5 years. Have your cholesterol levels checked more often if: Your lipid or cholesterol levels are high. You are older than 82 years of age. You are at high risk for heart disease. What should I know about cancer screening? Depending on your health history and family history, you may need to have cancer screening at various ages. This may include screening for: Breast cancer. Cervical cancer. Colorectal cancer. Skin cancer. Lung cancer. What should I know about heart disease, diabetes, and high blood pressure? Blood pressure and heart  disease High blood pressure causes heart disease and increases the risk of stroke. This is more likely to develop in people who have high blood pressure readings, are of African descent, or are overweight. Have your blood pressure checked: Every 3-5 years if you are 18-39 years of age. Every year if you are 40 years old or older. Diabetes Have regular diabetes screenings. This checks your fasting blood sugar level. Have the screening done: Once every three years after age 40 if you are at a normal weight and have a low risk for diabetes. More often and at a younger age if you are overweight or have a high risk for diabetes. What should I know about preventing infection? Hepatitis B If you have a higher risk for hepatitis B, you should be screened for this virus. Talk with your health care provider to find out if you are at risk for hepatitis B infection. Hepatitis C Testing is recommended for: Everyone born from 1945 through 1965. Anyone with known risk factors for hepatitis C. Sexually transmitted infections (STIs) Get screened for STIs, including gonorrhea and chlamydia, if: You are sexually active and are younger than 82 years of age. You are older than 82 years of age and your health care provider tells you that you are at risk for this type of infection. Your sexual activity has changed since you were last screened, and you are at increased risk for chlamydia or gonorrhea. Ask your health care provider if you are at risk. Ask your health care provider about whether you are at high risk for HIV. Your health care provider may recommend a prescription medicine   to help prevent HIV infection. If you choose to take medicine to prevent HIV, you should first get tested for HIV. You should then be tested every 3 months for as long as you are taking the medicine. Pregnancy If you are about to stop having your period (premenopausal) and you may become pregnant, seek counseling before you get  pregnant. Take 400 to 800 micrograms (mcg) of folic acid every day if you become pregnant. Ask for birth control (contraception) if you want to prevent pregnancy. Osteoporosis and menopause Osteoporosis is a disease in which the bones lose minerals and strength with aging. This can result in bone fractures. If you are 65 years old or older, or if you are at risk for osteoporosis and fractures, ask your health care provider if you should: Be screened for bone loss. Take a calcium or vitamin D supplement to lower your risk of fractures. Be given hormone replacement therapy (HRT) to treat symptoms of menopause. Follow these instructions at home: Lifestyle Do not use any products that contain nicotine or tobacco, such as cigarettes, e-cigarettes, and chewing tobacco. If you need help quitting, ask your health care provider. Do not use street drugs. Do not share needles. Ask your health care provider for help if you need support or information about quitting drugs. Alcohol use Do not drink alcohol if: Your health care provider tells you not to drink. You are pregnant, may be pregnant, or are planning to become pregnant. If you drink alcohol: Limit how much you use to 0-1 drink a day. Limit intake if you are breastfeeding. Be aware of how much alcohol is in your drink. In the [place] one drink equals one 12 oz bottle of beer (355 mL), one 5 oz glass of wine (148 mL), or one 1 oz glass of hard liquor (44 mL). General instructions Schedule regular health, dental, and eye exams. Stay current with your vaccines. Tell your health care provider if: You often feel depressed. You have ever been abused or do not feel safe at home. Summary Adopting a healthy lifestyle and getting preventive care are important in promoting health and wellness. Follow your health care provider's instructions about healthy diet, exercising, and getting tested or screened for diseases. Follow your health care provider's  instructions on monitoring your cholesterol and blood pressure. This information is not intended to replace advice given to you by your health care provider. Make sure you discuss any questions you have with your health care provider. Document Revised: 09/30/2020 Document Reviewed: 07/16/2018 Elsevier Patient Education  2022 Elsevier Inc.  

## 2021-05-31 NOTE — Progress Notes (Signed)
I,Katawbba Wiggins,acting as a Education administrator for [name], MD.,have documented all relevant documentation on the behalf of [name], MD,as directed by  [name], MD while in the presence of [name], MD.  This visit occurred during the SARS-CoV-2 public health emergency.  Safety protocols were in place, including screening questions prior to the visit, additional usage of staff PPE, and extensive cleaning of exam room while observing appropriate contact time as indicated for disinfecting solutions.  Subjective:     Patient ID: Ashley Lyons , female    DOB: 10-Jul-1941 , 80 y.o.   MRN: [id number]   Chief Complaint  Patient presents with   Annual Exam   Diabetes    HPI  She is here today for full physical examination. She is no longer followed by GYN. She reports compliance with meds.   Diabetes She presents for her follow-up diabetic visit. She has type 2 diabetes mellitus. There are no hypoglycemic associated symptoms. Pertinent negatives for diabetes include no blurred vision and no chest pain. There are no hypoglycemic complications. Risk factors for coronary artery disease include diabetes mellitus, dyslipidemia, sedentary lifestyle and post-menopausal. Current diabetic treatment includes diet. She is compliant with treatment most of the time. She is following a diabetic diet. She participates in exercise intermittently. An ACE inhibitor/angiotensin II receptor blocker is not being taken. Eye exam is current.    Past Medical History:  Diagnosis Date   Allergy    Diabetes mellitus    GERD (gastroesophageal reflux disease)    Glaucoma    right eye   Hyperlipidemia    Leukopenia      Family History  Problem Relation Age of Onset   Prostate cancer Father    Diabetes Mother    Prostate cancer Brother    Kidney disease Sister    Liver disease Brother    Colon cancer Brother    Esophageal cancer Neg Hx    Rectal cancer Neg Hx    Stomach cancer Neg Hx       Current Outpatient Medications:    Barberry-Oreg Grape-Goldenseal (BERBERINE COMPLEX PO), Take 500 mg by mouth., Disp: , Rfl:    BIOTIN PO, Take 100 mg by mouth. , Disp: , Rfl:    CALCIUM PO, Take 1,000 mg by mouth. , Disp: , Rfl:    Cholecalciferol (VITAMIN D3) 50 MCG (2000 UT) capsule, Take 2,000 Units by mouth daily., Disp: , Rfl:    co-enzyme Q-10 30 MG capsule, Take 30 mg by mouth daily. , Disp: , Rfl:    DiphenhydrAMINE HCl (BENADRYL ALLERGY PO), Take 25 mg by mouth as needed., Disp: , Rfl:    hydrocortisone 2.5 % cream, Apply to face twice daily for 2 wks, then daily for 2 wks, then daily as needed for itching, Disp: , Rfl:    Multiple Vitamin (MULTIVITAMIN) tablet, Take 1 tablet by mouth daily., Disp: , Rfl:    omega-3 acid ethyl esters (LOVAZA) 1 g capsule, Take by mouth daily. , Disp: , Rfl:    pravastatin (PRAVACHOL) 40 MG tablet, Take 1 tablet (40 mg total) by mouth daily. (Patient taking differently: Take 40 mg by mouth daily. 1 tab Monday, Wednesday, Friday, Saturday), Disp: 90 tablet, Rfl: 3   SELENIUM PO, Take by mouth. 200 mg, Disp: , Rfl:    SPIRULINA PO, Take by mouth. As needed, Disp: , Rfl:    timolol (TIMOPTIC) 0.5 % ophthalmic solution, , Disp: , Rfl:    vitamin C (  ASCORBIC ACID) 500 MG tablet, Take 500 mg by mouth daily., Disp: , Rfl:    Allergies  Allergen Reactions   Almond Oil Hives    Almonds   Bean Pod Extract Hives    String Beans   Corn-Containing Products Hives   Peanuts [Peanut Oil] Hives   Sodium Laureth Sulfate Hives   Soy Allergy Hives   Strawberry Extract Hives      The patient states she uses post menopausal status for birth control. Last LMP was No LMP recorded. Patient has had a hysterectomy.. Negative for Dysmenorrhea. Negative for: breast discharge, breast lump(s), breast pain and breast self exam. Associated symptoms include abnormal vaginal bleeding. Pertinent negatives include abnormal bleeding (hematology), anxiety, decreased libido,  depression, difficulty falling sleep, dyspareunia, history of infertility, nocturia, sexual dysfunction, sleep disturbances, urinary incontinence, urinary urgency, vaginal discharge and vaginal itching. Diet regular.The patient states her exercise level is  intermittent,  . The patient's tobacco use is:  Social History   Tobacco Use  Smoking Status Never  Smokeless Tobacco Never  . She has been exposed to passive smoke. The patient's alcohol use is:  Social History   Substance and Sexual Activity  Alcohol Use Not Currently   Comment: wine on occasion    Review of Systems  Constitutional: Negative.   HENT: Negative.    Eyes: Negative.  Negative for blurred vision.  Respiratory: Negative.    Cardiovascular: Negative.  Negative for chest pain.  Gastrointestinal: Negative.   Endocrine: Negative.   Genitourinary: Negative.   Musculoskeletal: Negative.   Skin: Negative.   Allergic/Immunologic: Negative.   Neurological: Negative.   Hematological: Negative.   Psychiatric/Behavioral: Negative.      Today's Vitals   05/31/21 1030  BP: 136/82  Pulse: (!) 48  Temp: 98.7 F (37.1 C)  Weight: 133 lb 12.8 oz (60.7 kg)  Height: 4' 11.8" (1.519 m)  PainSc: 0-No pain   Body mass index is 26.31 kg/m.  Wt Readings from Last 3 Encounters:  05/31/21 133 lb 12.8 oz (60.7 kg)  04/26/21 134 lb 6.4 oz (61 kg)  12/20/20 134 lb 3.2 oz (60.9 kg)    BP Readings from Last 3 Encounters:  05/31/21 136/82  04/26/21 128/66  12/20/20 122/70    Objective:  Physical Exam Vitals and nursing note reviewed.  Constitutional:      Appearance: Normal appearance.  HENT:     Head: Normocephalic and atraumatic.     Right Ear: Tympanic membrane, ear canal and external ear normal.     Left Ear: Tympanic membrane, ear canal and external ear normal.     Nose:     Comments: Masked     Mouth/Throat:     Comments: Masked  Eyes:     Extraocular Movements: Extraocular movements intact.      Conjunctiva/sclera: Conjunctivae normal.     Pupils: Pupils are equal, round, and reactive to light.  Cardiovascular:     Rate and Rhythm: Normal rate and regular rhythm.     Pulses: Normal pulses.          Dorsalis pedis pulses are 2+ on the right side and 2+ on the left side.     Heart sounds: Normal heart sounds.  Pulmonary:     Effort: Pulmonary effort is normal.     Breath sounds: Normal breath sounds.  Chest:  Breasts:    Tanner Score is 5.     Right: Normal.     Left: Normal.  Abdominal:  General: Abdomen is flat. Bowel sounds are normal.     Palpations: Abdomen is soft.  Genitourinary:    Comments: deferred Musculoskeletal:        General: Normal range of motion.     Cervical back: Normal range of motion and neck supple.  Feet:     Right foot:     Protective Sensation: 5 sites tested.  5 sites sensed.     Skin integrity: Dry skin present.     Toenail Condition: Right toenails are normal.     Left foot:     Protective Sensation: 5 sites tested.  5 sites sensed.     Skin integrity: Dry skin present.     Toenail Condition: Left toenails are normal.  Skin:    General: Skin is warm and dry.  Neurological:     General: No focal deficit present.     Mental Status: She is alert and oriented to person, place, and time.  Psychiatric:        Mood and Affect: Mood normal.        Behavior: Behavior normal.        Assessment And Plan:     1. Routine general medical examination at health care facility Comments: A full exam was performed. IMportance of monthly self breast exams was discussed with the patient. PATIENT IS ADVISED TO GET 30-45 MINUTES REGULAR EXERCISE NO LESS THAN FOUR TO FIVE DAYS PER WEEK - BOTH WEIGHTBEARING EXERCISES AND AEROBIC ARE RECOMMENDED.  PATIENT IS ADVISED TO FOLLOW A HEALTHY DIET WITH AT LEAST SIX FRUITS/VEGGIES PER DAY, DECREASE INTAKE OF RED MEAT, AND TO INCREASE FISH INTAKE TO TWO DAYS PER WEEK.  MEATS/FISH SHOULD NOT BE FRIED, BAKED OR BROILED  IS PREFERABLE.  IT IS ALSO IMPORTANT TO CUT BACK ON YOUR SUGAR INTAKE. PLEASE AVOID ANYTHING WITH ADDED SUGAR, CORN SYRUP OR OTHER SWEETENERS. IF YOU MUST USE A SWEETENER, YOU CAN TRY STEVIA. IT IS ALSO IMPORTANT TO AVOID ARTIFICIALLY SWEETENERS AND DIET BEVERAGES. LASTLY, I SUGGEST WEARING SPF 50 SUNSCREEN ON EXPOSED PARTS AND ESPECIALLY WHEN IN THE DIRECT SUNLIGHT FOR AN EXTENDED PERIOD OF TIME.  PLEASE AVOID FAST FOOD RESTAURANTS AND INCREASE YOUR WATER INTAKE.   2. Type 2 diabetes mellitus with stage 2 chronic kidney disease, without long-term current use of insulin (HCC) Comments: Diabetic foot exam was performed. EKG performed, marked sinus bradycardia w/ PACs. She is not symptomatic. I DISCUSSED WITH THE PATIENT AT LENGTH REGARDING THE GOALS OF GLYCEMIC CONTROL AND POSSIBLE LONG-TERM COMPLICATIONS.  I  ALSO STRESSED THE IMPORTANCE OF COMPLIANCE WITH HOME GLUCOSE MONITORING, DIETARY RESTRICTIONS INCLUDING AVOIDANCE OF SUGARY DRINKS/PROCESSED FOODS,  ALONG WITH REGULAR EXERCISE.  I  ALSO STRESSED THE IMPORTANCE OF ANNUAL EYE EXAMS, SELF FOOT CARE AND COMPLIANCE WITH OFFICE VISITS.  - POCT Urinalysis Dipstick (81002) - POCT UA - Microalbumin - EKG 12-Lead - CMP14+EGFR - Lipid panel - Hemoglobin A1c  3. Pure hypercholesterolemia Comments: Chronic, encouraged to follow heart healthy diet. She will continue with current meds.   4. Immunization due Comments: I will send rx Shingrix to her local pharmacy.  Patient was given opportunity to ask questions. Patient verbalized understanding of the plan and was able to repeat key elements of the plan. All questions were answered to their satisfaction.   I, [name], MD, have reviewed all documentation for this visit. The documentation on 06/07/21 for the exam, diagnosis, procedures, and orders are all accurate and complete.   THE PATIENT IS ENCOURAGED TO PRACTICE SOCIAL  DISTANCING DUE TO THE COVID-19 PANDEMIC.

## 2021-06-01 LAB — HEMOGLOBIN A1C
Est. average glucose Bld gHb Est-mCnc: 137 mg/dL
Hgb A1c MFr Bld: 6.4 % — ABNORMAL HIGH (ref 4.8–5.6)

## 2021-06-01 LAB — CMP14+EGFR
ALT: 12 IU/L (ref 0–32)
AST: 23 IU/L (ref 0–40)
Albumin/Globulin Ratio: 1.4 (ref 1.2–2.2)
Albumin: 4.1 g/dL (ref 3.6–4.6)
Alkaline Phosphatase: 61 IU/L (ref 44–121)
BUN/Creatinine Ratio: 14 (ref 12–28)
BUN: 11 mg/dL (ref 8–27)
Bilirubin Total: 0.6 mg/dL (ref 0.0–1.2)
CO2: 24 mmol/L (ref 20–29)
Calcium: 9.1 mg/dL (ref 8.7–10.3)
Chloride: 101 mmol/L (ref 96–106)
Creatinine, Ser: 0.76 mg/dL (ref 0.57–1.00)
Globulin, Total: 2.9 g/dL (ref 1.5–4.5)
Glucose: 100 mg/dL — ABNORMAL HIGH (ref 70–99)
Potassium: 4.7 mmol/L (ref 3.5–5.2)
Sodium: 140 mmol/L (ref 134–144)
Total Protein: 7 g/dL (ref 6.0–8.5)
eGFR: 78 mL/min/{1.73_m2} (ref >59)

## 2021-06-01 LAB — LIPID PANEL
Chol/HDL Ratio: 2.8 ratio (ref 0.0–4.4)
Cholesterol, Total: 186 mg/dL (ref 100–199)
HDL: 67 mg/dL (ref >39)
LDL Chol Calc (NIH): 106 mg/dL — ABNORMAL HIGH (ref 0–99)
Triglycerides: 67 mg/dL (ref 0–149)
VLDL Cholesterol Cal: 13 mg/dL (ref 5–40)

## 2021-06-07 DIAGNOSIS — N182 Chronic kidney disease, stage 2 (mild): Secondary | ICD-10-CM | POA: Insufficient documentation

## 2021-06-16 ENCOUNTER — Telehealth: Payer: Self-pay

## 2021-06-16 ENCOUNTER — Other Ambulatory Visit: Payer: Self-pay

## 2021-06-16 ENCOUNTER — Ambulatory Visit (HOSPITAL_BASED_OUTPATIENT_CLINIC_OR_DEPARTMENT_OTHER): Payer: Medicare Other | Admitting: Cardiovascular Disease

## 2021-06-16 VITALS — BP 144/70 | HR 49 | Ht 59.0 in | Wt 132.0 lb

## 2021-06-16 DIAGNOSIS — E7800 Pure hypercholesterolemia, unspecified: Secondary | ICD-10-CM

## 2021-06-16 DIAGNOSIS — R001 Bradycardia, unspecified: Secondary | ICD-10-CM

## 2021-06-16 NOTE — Telephone Encounter (Signed)
Returned her call, she wants to attend PREP at [facility], every T/Th starting 11/15 2-3:15; assessment visit scheduled for 1:30p on 11/15

## 2021-06-16 NOTE — Telephone Encounter (Signed)
She returned my call, left voicemail

## 2021-06-16 NOTE — Telephone Encounter (Signed)
Called to discuss PREP program schedule at [facility], left voicemail. 

## 2021-06-16 NOTE — Patient Instructions (Signed)
Medication Instructions:  Your physician recommends that you continue on your current medications as directed. Please refer to the Current Medication list given to you today.   *If you need a refill on your cardiac medications before your next appointment, please call your pharmacy*  Lab Work: NONE  Testing/Procedures: NONE   Follow-Up: At [facility], you and your health needs are our priority.  As part of our continuing mission to provide you with exceptional heart care, we have created designated Provider Care Teams.  These Care Teams include your primary Cardiologist (physician) and Advanced Practice Providers (APPs -  Physician Assistants and Nurse Practitioners) who all work together to provide you with the care you need, when you need it.  We recommend signing up for the patient portal called "MyChart".  Sign up information is provided on this After Visit Summary.  MyChart is used to connect with patients for Virtual Visits (Telemedicine).  Patients are able to view lab/test results, encounter notes, upcoming appointments, etc.  Non-urgent messages can be sent to your provider as well.   To learn more about what you can do with MyChart, go to [email or web address].    Your next appointment:   6 month(s)  The format for your next appointment:   In Person  Provider:   Skeet Latch, MD   Other Instructions  ONCE YOU PURCHASE BLOOD PRESSURE MACHINE ([facility] RECOMMENDED) MONITOR YOUR BLOOD PRESSURE AND CALL THE OFFICE IF NOT CONSISTENTLY BELOW 130/80   YOU HAVE BEEN REFERRED TO PREP ([facility]) PROGRAM  IF YOU DO NOT HEAR FROM [facility] OR [facility] IN 2 WEEKS CALL THE OFFICE TO FOLLOW UP

## 2021-06-16 NOTE — Progress Notes (Signed)
Cardiology Office Note   Date:  06/19/2021   ID:  Ashley Lyons, DOB 1941/01/03, MRN [id number]  PCP:  Ashley Chard, MD  Cardiologist:   Ashley Latch, MD   No chief complaint on file.   History of Present Illness: Ashley Lyons is a 80 y.o. female with diabetes,hypertension,  hyperlipidemia, and GERD here for follow-up.  She was initially seen 06/2019 for evaluation of bradycardia.  She saw Dr. Baird Lyons on 04/2019 and was noted to be bradycardic.  Her heart rate was 43 bpm at that appointment.  EKG at that time revealed sinus bradycardia at 43 bpm and LAFB.  She has a history of bradycardia but this was the lowest recorded value.  At her cardiology appointment she was feeling well and asymptomatic.  No changes were made and she was not referred for pacemaker.  We did recommend increasing her pravastatin to daily, which she has tolerated well.  She has been feeling well and denies any myalgias.  She has been under a lot of stress from work.  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  This is stopped her from getting exercise, despite her intentions to increase her exercise.  Her grandson has a gym and she has an exercise bike.  She just does not have time to use them.  She does her house work and denies any exertional chest pain or shortness of breath.  She has no lower extremity edema, orthopnea, or PND.  She reports that her diet has been good.  She does not eat any fried foods or fatty foods.  She mostly eats fish and has red meat rarely.   Ashley Lyons has been under a lot of stress lately.  She had two family members die tragically recently.  She has been trying to exercise off and on.  She has no exertional chest pain or shortness of breath.  She denies LE edema, orthopnea or PND.  She denies lightheadedness or dizziness.    Past Medical History:  Diagnosis Date   Allergy    Diabetes mellitus    GERD (gastroesophageal reflux disease)    Glaucoma    right eye    Hyperlipidemia    Leukopenia     Past Surgical History:  Procedure Laterality Date   APPENDECTOMY     COLONOSCOPY     TONSILLECTOMY     TUBAL LIGATION     VAGINAL HYSTERECTOMY       Current Outpatient Medications  Medication Sig Dispense Refill   Barberry-Oreg Grape-Goldenseal (BERBERINE COMPLEX PO) Take 500 mg by mouth.     BIOTIN PO Take 100 mg by mouth.      CALCIUM PO Take 1,000 mg by mouth.      Cholecalciferol (VITAMIN D3) 50 MCG (2000 UT) capsule Take 2,000 Units by mouth daily.     co-enzyme Q-10 30 MG capsule Take 30 mg by mouth daily.      DiphenhydrAMINE HCl (BENADRYL ALLERGY PO) Take 25 mg by mouth as needed.     hydrocortisone 2.5 % cream Apply to face twice daily for 2 wks, then daily for 2 wks, then daily as needed for itching     Multiple Vitamin (MULTIVITAMIN) tablet Take 1 tablet by mouth daily.     omega-3 acid ethyl esters (LOVAZA) 1 g capsule Take by mouth daily.      pravastatin (PRAVACHOL) 40 MG tablet Take 1 tablet (40 mg total) by mouth daily. (Patient taking differently: Take 40 mg  by mouth daily. 1 tab Monday, Wednesday, Friday, Saturday) 90 tablet 3   SELENIUM PO Take by mouth. 200 mg     SPIRULINA PO Take by mouth. As needed     timolol (TIMOPTIC) 0.5 % ophthalmic solution      vitamin C (ASCORBIC ACID) 500 MG tablet Take 500 mg by mouth daily.     No current facility-administered medications for this visit.    Allergies:   Almond oil, Bean pod extract, Corn-containing products, Peanuts [peanut oil], Sodium laureth sulfate, Soy allergy, and Strawberry extract    Social History:  The patient  reports that she has never smoked. She has never used smokeless tobacco. She reports that she does not currently use alcohol. She reports that she does not use drugs.   Family History:  The patient's family history includes Colon Lyons in her brother; Diabetes in her mother; Kidney disease in her sister; Liver disease in her brother; Prostate Lyons in her  brother and father.    ROS:  Please see the history of present illness.   Otherwise, review of systems are positive for none.   All other systems are reviewed and negative.    PHYSICAL EXAM: VS:  BP (!) 144/70   Pulse (!) 49   Ht 4\' 11"  (1.499 m)   Wt 132 lb (59.9 kg)   SpO2 100%   BMI 26.66 kg/m  , BMI Body mass index is 26.66 kg/m. GENERAL:  Well appearing HEENT: Pupils equal round and reactive, fundi not visualized, oral mucosa unremarkable NECK:  No jugular venous distention, waveform within normal limits, carotid upstroke brisk and symmetric, no bruits LUNGS:  Clear to auscultation bilaterally HEART:  RRR.  PMI not displaced or sustained,S1 and S2 within normal limits, no S3, no S4, no clicks, no rubs, no murmurs ABD:  Flat, positive bowel sounds normal in frequency in pitch, no bruits, no rebound, no guarding, no midline pulsatile mass, no hepatomegaly, no splenomegaly EXT:  2 plus pulses throughout, no edema, no cyanosis no clubbing SKIN:  No rashes no nodules NEURO:  Cranial nerves II through XII grossly intact, motor grossly intact throughout PSYCH:  Cognitively intact, oriented to person place and time  EKG:  EKG is not ordered today. 04/15/2019: Sinus bradycardia.  Rate 43 bpm.  LAFB.   Recent Labs: 12/20/2020: Hemoglobin 13.0; Platelets 231 05/31/2021: ALT 12; BUN 11; Creatinine, Ser 0.76; Potassium 4.7; Sodium 140    Lipid Panel    Component Value Date/Time   CHOL 186 05/31/2021 1417   TRIG 67 05/31/2021 1417   HDL 67 05/31/2021 1417   CHOLHDL 2.8 05/31/2021 1417   LDLCALC 106 (H) 05/31/2021 1417      Wt Readings from Last 3 Encounters:  06/16/21 132 lb (59.9 kg)  05/31/21 133 lb 12.8 oz (60.7 kg)  04/26/21 134 lb 6.4 oz (61 kg)      ASSESSMENT AND PLAN:  # Bradycardia:  She is asymptomatic.  Avoid any nodal agents.  # Hyperlipidemia:  Lipids are poorly controlled.  LDL should be at least <100.  Her diet has improved but her lipids remain above  goal.  We will increase pravastatin to 40 mg.  I do not see that she actually has any statin allergies.  If her lipids remain above goal would consider switching to an alternative agent.  # Overweight:  Referral to PREP.  Keep working on diet and exercise.   # Elevated BP: BP was elevated in clinic today.  She thinks it  has been controlled.  She will get a BP cuff and track at home.   She sees her PCP soon and will discuss.    Current medicines are reviewed at length with the patient today.  The patient does not have concerns regarding medicines.  The following changes have been made:  Pravastatin 40mg  daily  Labs/ tests ordered today include:   Orders Placed This Encounter  Procedures   Amb Referral To Provider Referral Exercise Program (P.R.E.P)      Disposition:   FU with Jacaden Forbush C. Oval Linsey, MD, [facility] in 6 months.       Signed, Krish Bailly C. Oval Linsey, MD, [facility]  06/19/2021 5:33 PM    [place]

## 2021-06-19 ENCOUNTER — Encounter (HOSPITAL_BASED_OUTPATIENT_CLINIC_OR_DEPARTMENT_OTHER): Payer: Self-pay | Admitting: Cardiovascular Disease

## 2021-06-20 NOTE — Progress Notes (Signed)
[facility] PREP Evaluation  Patient Details  Name: Ashley Lyons MRN: [id number] Date of Birth: 1940-09-13 Age: 80 y.o. PCP: Glendale Chard, MD  Vitals:   06/20/21 1544  BP: (!) 142/60  Pulse: (!) 56  SpO2: 98%  Weight: 133 lb 3.2 oz (60.4 kg)     [facility] Eval - 06/20/21 1500       [facility] "PREP" Location   [facility] "PREP" Location [place]      Referral    Referring Provider Oval Linsey    Reason for referral Inactivity;High Cholesterol    Program Start Date 06/20/21      Measurement   Waist Circumference 32 inches    Hip Circumference 40 inches    Body fat 25.8 percent      Information for Trainer   Goals --   Improve overall wellbeing, including a disciplined exercise program   Current Exercise none    Pertinent Medical History Diabetes, high cholesterol    Current Barriers none      Timed Up and Go (TUGS)   Timed Up and Go Low risk <9 seconds      Mobility and Daily Activities   I find it easy to walk up or down two or more flights of stairs. 4    I have no trouble taking out the trash. 4    I do housework such as vacuuming and dusting on my own without difficulty. 4    I can easily lift a gallon of milk (8lbs). 4    I can easily walk a mile. 2    I have no trouble reaching into high cupboards or reaching down to pick up something from the floor. 4    I do not have trouble doing out-door work such as Armed forces logistics/support/administrative officer, raking leaves, or gardening. 1      Mobility and Daily Activities   I feel younger than my age. 4    I feel independent. 4    I feel energetic. 4    I live an active life.  4    I feel strong. 4    I feel healthy. 4    I feel active as other people my age. 4      How fit and strong are you.   Fit and Strong Total Score 51            Past Medical History:  Diagnosis Date   Allergy    Diabetes mellitus    GERD (gastroesophageal reflux disease)    Glaucoma    right eye   Hyperlipidemia    Leukopenia    Past Surgical History:   Procedure Laterality Date   APPENDECTOMY     COLONOSCOPY     TONSILLECTOMY     TUBAL LIGATION     VAGINAL HYSTERECTOMY     Social History   Tobacco Use  Smoking Status Never  Smokeless Tobacco Never  To begin PREP classes today, every T/Th at [facility] 06/20/2021, 3:47 PM

## 2021-06-20 NOTE — Progress Notes (Signed)
[facility] PREP Weekly Session  Patient Details  Name: Ashley Lyons MRN: [id number] Date of Birth: May 05, 1941 Age: 80 y.o. PCP: Glendale Chard, MD  Introductions, review of program/handbook, tour of facility;offered option to work out on cardio machine    [name] 06/20/2021, 3:50 PM

## 2021-06-23 ENCOUNTER — Encounter: Payer: Self-pay | Admitting: Internal Medicine

## 2021-07-03 ENCOUNTER — Other Ambulatory Visit: Payer: Self-pay | Admitting: Cardiovascular Disease

## 2021-07-04 NOTE — Progress Notes (Signed)
[facility] PREP Weekly Session  Patient Details  Name: MONZERRATH MCBURNEY MRN: [id number] Date of Birth: 1941/07/13 Age: 80 y.o. PCP: Glendale Chard, MD  There were no vitals filed for this visit.   [facility] Weekly seesion - 07/04/21 1600       [facility] "PREP" Location   [facility] "PREP" Location [facility]      Weekly Session   Topic Discussed Healthy eating tips    Minutes exercised this week 480 minutes    Classes attended to date [place] 07/04/2021, 4:10 PM

## 2021-07-11 NOTE — Progress Notes (Signed)
[facility] PREP Weekly Session  Patient Details  Name: Ashley Lyons MRN: [id number] Date of Birth: 01/09/1941 Age: 80 y.o. PCP: Glendale Chard, MD  Vitals:   07/11/21 1506  Weight: 132 lb 12.8 oz (60.2 kg)     [facility] Weekly seesion - 07/11/21 1500       [facility] "PREP" Location   [facility] "PREP" Location [facility]      Weekly Session   Topic Discussed --   sugar demo   Minutes exercised this week 15 minutes    Classes attended to date [place] 07/11/2021, 3:06 PM

## 2021-07-18 NOTE — Progress Notes (Signed)
[facility] PREP Weekly Session  Patient Details  Name: Ashley Lyons MRN: [id number] Date of Birth: April 28, 1941 Age: 80 y.o. PCP: Glendale Chard, MD  Vitals:   07/18/21 1525  Weight: 131 lb 9.6 oz (59.7 kg)     [facility] Weekly seesion - 07/18/21 1500       [facility] "PREP" Location   [facility] "PREP[facility] Location [facility]      Weekly Session   Topic Discussed Restaurant Eating   Na intake limited to 1500-2300 mg/daily; Salt demo   Minutes exercised this week 30 minutes    Classes attended to date [place] 07/18/2021, 3:26 PM

## 2021-07-25 NOTE — Progress Notes (Signed)
[facility] PREP Weekly Session  Patient Details  Name: Ashley Lyons MRN: [id number] Date of Birth: [birth year] Age: 80 y.o. PCP: Glendale Chard, MD  Vitals:   07/25/21 1528  Weight: 131 lb (59.4 kg)     [facility] Weekly seesion - 07/25/21 1500       [facility] "PREP" Location   [facility] "PREP" Location [facility]      Weekly Session   Topic Discussed Stress management and problem solving   Practiced breathwork and finger tip mudra; shared fruit bowl meditation   Minutes exercised this week 50 minutes    Classes attended to date [place] 07/25/2021, 3:29 PM

## 2021-08-08 NOTE — Progress Notes (Signed)
[facility] PREP Weekly Session  Patient Details  Name: Ashley Lyons MRN: [id number] Date of Birth: Oct 04, 1940 Age: 81 y.o. PCP: Glendale Chard, MD  Vitals:   08/08/21 1539  Weight: 131 lb 6.4 oz (59.6 kg)     [facility] Weekly seesion - 08/08/21 1500       [facility] "PREP" Location   [facility] "PREP" Location [facility]      Weekly Session   Topic Discussed Expectations and non-scale victories   Halfway through program, encouraged to re-vsit goals---successes, challenges, action steps for next 6 weeks   Minutes exercised this week 50 minutes    Classes attended to date [place] 08/08/2021, 3:41 PM

## 2021-08-15 NOTE — Progress Notes (Signed)
[facility] PREP Weekly Session  Patient Details  Name: Ashley Lyons MRN: [id number] Date of Birth: 1941/04/03 Age: 81 y.o. PCP: Glendale Chard, MD  There were no vitals filed for this visit.   [facility] Weekly seesion - 08/15/21 1500       [facility] "PREP" Location   [facility] "PREP" Location [facility]      Weekly Session   Topic Discussed Other   Portion Size Matters, visualize your portion size demo; label review   Minutes exercised this week 30 minutes    Classes attended to date [name] 08/15/2021, 3:22 PM

## 2021-08-22 NOTE — Progress Notes (Signed)
[facility] PREP Weekly Session  Patient Details  Name: Ashley Lyons MRN: [id number] Date of Birth: 30-Mar-1941 Age: 81 y.o. PCP: Glendale Chard, MD  Vitals:   08/22/21 1603  Weight: 127 lb (57.6 kg)     [facility] Weekly seesion - 08/22/21 1600       [facility] "PREP" Location   [facility] "PREP" Location [facility]      Weekly Session   Topic Discussed Finding support   Reviewed multiple food labels for nutrition, servng size, ingredients   Minutes exercised this week 15 minutes    Classes attended to date [place] 08/22/2021, 4:03 PM

## 2021-08-29 NOTE — Progress Notes (Signed)
[facility] PREP Weekly Session  Patient Details  Name: Ashley Lyons MRN: [id number] Date of Birth: [birth year] Age: 81 y.o. PCP: Glendale Chard, MD  Vitals:   08/29/21 1608  Weight: 128 lb 9.6 oz (58.3 kg)     [facility] Weekly seesion - 08/29/21 1600       [facility] "PREP" Location   [facility] "PREP" Location [facility]      Weekly Session   Topic Discussed Calorie breakdown   Membership talk with Alyssa; fat/muscle comparison   Minutes exercised this week 10 minutes    Classes attended to date 71             [place] 08/29/2021, 4:09 PM

## 2021-09-05 NOTE — Progress Notes (Signed)
[facility] PREP Weekly Session  Patient Details  Name: Ashley Lyons MRN: [id number] Date of Birth: 1940/11/13 Age: 81 y.o. PCP: Glendale Chard, MD  Vitals:   09/05/21 1529  Weight: 127 lb (57.6 kg)     [facility] Weekly seesion - 09/05/21 1500       [facility] "PREP" Location   [facility] "PREP" Location [facility]      Weekly Session   Topic Discussed Hitting roadblocks   Reviewed goals and activity plan for next 90 days, asked to complete PREP/THN survey and bring both to final assessment visit next week.   Minutes exercised this week 10 minutes    Classes attended to date [place] 09/05/2021, 3:34 PM

## 2021-09-11 ENCOUNTER — Other Ambulatory Visit: Payer: Self-pay

## 2021-09-11 ENCOUNTER — Ambulatory Visit: Payer: Medicare Other | Admitting: Internal Medicine

## 2021-09-11 ENCOUNTER — Encounter: Payer: Self-pay | Admitting: Internal Medicine

## 2021-09-11 ENCOUNTER — Ambulatory Visit (INDEPENDENT_AMBULATORY_CARE_PROVIDER_SITE_OTHER): Payer: Medicare Other | Admitting: Internal Medicine

## 2021-09-11 VITALS — BP 114/74 | HR 47 | Temp 98.3°F | Ht 59.0 in | Wt 129.2 lb

## 2021-09-11 DIAGNOSIS — Z79899 Other long term (current) drug therapy: Secondary | ICD-10-CM

## 2021-09-11 DIAGNOSIS — E1122 Type 2 diabetes mellitus with diabetic chronic kidney disease: Secondary | ICD-10-CM

## 2021-09-11 DIAGNOSIS — D709 Neutropenia, unspecified: Secondary | ICD-10-CM

## 2021-09-11 DIAGNOSIS — E7800 Pure hypercholesterolemia, unspecified: Secondary | ICD-10-CM

## 2021-09-11 DIAGNOSIS — N182 Chronic kidney disease, stage 2 (mild): Secondary | ICD-10-CM | POA: Diagnosis not present

## 2021-09-11 DIAGNOSIS — Z6826 Body mass index (BMI) 26.0-26.9, adult: Secondary | ICD-10-CM

## 2021-09-11 NOTE — Progress Notes (Signed)
Rich Brave [place],acting as a Education administrator for [name], MD.,have documented all relevant documentation on the behalf of [name], MD,as directed by  [name], MD while in the presence of [name], MD.  This visit occurred during the SARS-CoV-2 public health emergency.  Safety protocols were in place, including screening questions prior to the visit, additional usage of staff PPE, and extensive cleaning of exam room while observing appropriate contact time as indicated for disinfecting solutions.  Subjective:     Patient ID: Ashley Lyons , female    DOB: [birth year] , 81 y.o.   MRN: [id number]   Chief Complaint  Patient presents with   Diabetes    HPI  She presents today for diabetes check. She has been able to control her DM without meds. She is now participating in [place] program at the [facility] per Cardiology. She enjoys meeting with her health coach and will be meeting with her personal trainer in the near future.    Diabetes She presents for her follow-up diabetic visit. She has type 2 diabetes mellitus. Her disease course has been stable. There are no hypoglycemic associated symptoms. Pertinent negatives for diabetes include no polydipsia, no polyphagia and no polyuria. There are no hypoglycemic complications. Risk factors for coronary artery disease include dyslipidemia, diabetes mellitus, sedentary lifestyle and post-menopausal. She is following a generally healthy diet. She participates in exercise intermittently. There is no change in her home blood glucose trend. Her breakfast blood glucose is taken between 7-8 am. Her breakfast blood glucose range is generally 90-110 mg/dl. An ACE inhibitor/angiotensin II receptor blocker is not being taken. Eye exam is current.  Hyperlipidemia This is a chronic problem. The problem is controlled. Exacerbating diseases include diabetes. Current antihyperlipidemic treatment includes statins. Risk factors for coronary artery disease  include diabetes mellitus, dyslipidemia and post-menopausal.    Past Medical History:  Diagnosis Date   Allergy    Diabetes mellitus    GERD (gastroesophageal reflux disease)    Glaucoma    right eye   Hyperlipidemia    Leukopenia      Family History  Problem Relation Age of Onset   Prostate cancer Father    Diabetes Mother    Prostate cancer Brother    Kidney disease Sister    Liver disease Brother    Colon cancer Brother    Esophageal cancer Neg Hx    Rectal cancer Neg Hx    Stomach cancer Neg Hx     Current Outpatient Medications:    Barberry-Oreg Grape-Goldenseal (BERBERINE COMPLEX PO), Take 500 mg by mouth., Disp: , Rfl:    BIOTIN PO, Take 100 mg by mouth. , Disp: , Rfl:    CALCIUM PO, Take 1,000 mg by mouth. , Disp: , Rfl:    Cholecalciferol (VITAMIN D3) 50 MCG (2000 UT) capsule, Take 2,000 Units by mouth daily., Disp: , Rfl:    co-enzyme Q-10 30 MG capsule, Take 30 mg by mouth daily. , Disp: , Rfl:    DiphenhydrAMINE HCl (BENADRYL ALLERGY PO), Take 25 mg by mouth as needed., Disp: , Rfl:    hydrocortisone 2.5 % cream, Apply to face twice daily for 2 wks, then daily for 2 wks, then daily as needed for itching, Disp: , Rfl:    Multiple Vitamin (MULTIVITAMIN) tablet, Take 1 tablet by mouth daily., Disp: , Rfl:    omega-3 acid ethyl esters (LOVAZA) 1 g capsule, Take by mouth daily. , Disp: , Rfl:  pravastatin (PRAVACHOL) 40 MG tablet, Take 1 tablet by mouth once daily, Disp: 90 tablet, Rfl: 3   SELENIUM PO, Take by mouth. 200 mg, Disp: , Rfl:    SPIRULINA PO, Take by mouth. As needed, Disp: , Rfl:    timolol (TIMOPTIC) 0.5 % ophthalmic solution, , Disp: , Rfl:    vitamin C (ASCORBIC ACID) 500 MG tablet, Take 500 mg by mouth daily., Disp: , Rfl:    Allergies  Allergen Reactions   Almond Oil Hives    Almonds   Bean Pod Extract Hives    String Beans   Corn-Containing Products Hives   Peanuts [Peanut Oil] Hives   Sodium Laureth Sulfate Hives   Soy Allergy Hives    Strawberry Extract Hives     Review of Systems  Constitutional: Negative.   Eyes: Negative.   Respiratory: Negative.    Cardiovascular: Negative.   Gastrointestinal: Negative.   Endocrine: Negative for polydipsia, polyphagia and polyuria.  Musculoskeletal: Negative.   Skin: Negative.   Neurological: Negative.   Psychiatric/Behavioral: Negative.      Today's Vitals   09/11/21 0952  BP: 114/74  Pulse: (!) 47  Temp: 98.3 F (36.8 C)  Weight: 129 lb 3.2 oz (58.6 kg)  Height: 4' 11"  (1.499 m)  PainSc: 0-No pain   Body mass index is 26.1 kg/m.  Wt Readings from Last 3 Encounters:  09/11/21 129 lb 3.2 oz (58.6 kg)  09/05/21 127 lb (57.6 kg)  08/29/21 128 lb 9.6 oz (58.3 kg)     Objective:  Physical Exam Vitals and nursing note reviewed.  Constitutional:      Appearance: Normal appearance.  HENT:     Head: Normocephalic and atraumatic.     Nose:     Comments: MASKED     Mouth/Throat:     Comments: MASKED  Eyes:     Extraocular Movements: Extraocular movements intact.  Cardiovascular:     Rate and Rhythm: Normal rate and regular rhythm.     Heart sounds: Normal heart sounds.  Pulmonary:     Effort: Pulmonary effort is normal.     Breath sounds: Normal breath sounds.  Musculoskeletal:     Cervical back: Normal range of motion.  Skin:    General: Skin is warm.  Neurological:     General: No focal deficit present.     Mental Status: She is alert.  Psychiatric:        Mood and Affect: Mood normal.        Behavior: Behavior normal.      Assessment And Plan:     1. Type 2 diabetes mellitus with stage 2 chronic kidney disease, without long-term current use of insulin (HCC) Comments: Chronic, I will check labs as below. We discussed use of ARB for renal protection in setting of DM. She will f/u in 4 months for re-evaluation.  - Hemoglobin A1c - CMP14+EGFR  2. Pure hypercholesterolemia Comments: Chronic, currently on pravastatin without any issues.Labs from Oct  2022 reviewed, will recheck at her next visit. Goal LDL <70.  3. Neutropenia, unspecified type ([place]) Comments: I will recheck CBC + diff today.  - CBC with Diff  4. BMI 26.0-26.9,adult Comments: She is now participating in [place] program at the [facility]. She was congratulated on her progress thus far and encouraged to keep up the great work.   5. Drug therapy Comments: I will check a vitamin B12 level today.  - Vitamin B12   Patient was given opportunity to ask questions.  Patient verbalized understanding of the plan and was able to repeat key elements of the plan. All questions were answered to their satisfaction.   I, [name], MD, have reviewed all documentation for this visit. The documentation on 09/11/21 for the exam, diagnosis, procedures, and orders are all accurate and complete.   IF YOU HAVE BEEN REFERRED TO A SPECIALIST, IT MAY TAKE 1-2 WEEKS TO SCHEDULE/PROCESS THE REFERRAL. IF YOU HAVE NOT HEARD FROM US/SPECIALIST IN TWO WEEKS, PLEASE GIVE [place] A CALL AT [phone].   THE PATIENT IS ENCOURAGED TO PRACTICE SOCIAL DISTANCING DUE TO THE COVID-19 PANDEMIC.

## 2021-09-11 NOTE — Patient Instructions (Signed)

## 2021-09-12 LAB — CBC WITH DIFFERENTIAL/PLATELET
Basophils Absolute: 0 10*3/uL (ref 0.0–0.2)
Basos: 1 %
EOS (ABSOLUTE): 0.3 10*3/uL (ref 0.0–0.4)
Eos: 11 %
Hematocrit: 36.3 % (ref 34.0–46.6)
Hemoglobin: 12.3 g/dL (ref 11.1–15.9)
Immature Grans (Abs): 0 10*3/uL (ref 0.0–0.1)
Immature Granulocytes: 0 %
Lymphocytes Absolute: 1.1 10*3/uL (ref 0.7–3.1)
Lymphs: 39 %
MCH: 33.9 pg — ABNORMAL HIGH (ref 26.6–33.0)
MCHC: 33.9 g/dL (ref 31.5–35.7)
MCV: 100 fL — ABNORMAL HIGH (ref 79–97)
Monocytes Absolute: 0.2 10*3/uL (ref 0.1–0.9)
Monocytes: 8 %
Neutrophils Absolute: 1.1 10*3/uL — ABNORMAL LOW (ref 1.4–7.0)
Neutrophils: 41 %
Platelets: 275 10*3/uL (ref 150–450)
RBC: 3.63 x10E6/uL — ABNORMAL LOW (ref 3.77–5.28)
RDW: 13.3 % (ref 11.7–15.4)
WBC: 2.7 10*3/uL — ABNORMAL LOW (ref 3.4–10.8)

## 2021-09-12 LAB — CMP14+EGFR
ALT: 8 IU/L (ref 0–32)
AST: 16 IU/L (ref 0–40)
Albumin/Globulin Ratio: 1.4 (ref 1.2–2.2)
Albumin: 4 g/dL (ref 3.6–4.6)
Alkaline Phosphatase: 60 IU/L (ref 44–121)
BUN/Creatinine Ratio: 12 (ref 12–28)
BUN: 10 mg/dL (ref 8–27)
Bilirubin Total: 0.4 mg/dL (ref 0.0–1.2)
CO2: 24 mmol/L (ref 20–29)
Calcium: 9.1 mg/dL (ref 8.7–10.3)
Chloride: 104 mmol/L (ref 96–106)
Creatinine, Ser: 0.82 mg/dL (ref 0.57–1.00)
Globulin, Total: 2.9 g/dL (ref 1.5–4.5)
Glucose: 109 mg/dL — ABNORMAL HIGH (ref 70–99)
Potassium: 4.1 mmol/L (ref 3.5–5.2)
Sodium: 144 mmol/L (ref 134–144)
Total Protein: 6.9 g/dL (ref 6.0–8.5)
eGFR: 71 mL/min/{1.73_m2} (ref >59)

## 2021-09-12 LAB — HEMOGLOBIN A1C
Est. average glucose Bld gHb Est-mCnc: 143 mg/dL
Hgb A1c MFr Bld: 6.6 % — ABNORMAL HIGH (ref 4.8–5.6)

## 2021-09-12 LAB — VITAMIN B12: Vitamin B-12: 955 pg/mL (ref 232–1245)

## 2021-09-12 NOTE — Progress Notes (Signed)
[facility] PREP Weekly Session  Patient Details  Name: Ashley Lyons MRN: [id number] Date of Birth: July 12, 1941 Age: 81 y.o. PCP: Glendale Chard, MD  Vitals:   09/12/21 1534  Weight: 128 lb 8 oz (58.3 kg)     [facility] Weekly seesion - 09/12/21 1500       [facility] "PREP" Location   [facility] "PREP" Location [facility]      Weekly Session   Topic Discussed Other   Fit testing completed; 3 month how fit and strong survey completed; final assessment visit scheduled for Thursday, to bring goals and activity plan for next 90 days and completed PREP survey Thursday   Minutes exercised this week 20 minutes    Classes attended to date [place] 09/12/2021, 3:36 PM

## 2021-09-14 ENCOUNTER — Other Ambulatory Visit: Payer: Self-pay

## 2021-09-14 MED ORDER — VALSARTAN 40 MG PO TABS: 40.0000 mg | ORAL_TABLET | Freq: Every day | ORAL | 11 refills | Status: DC

## 2021-09-14 NOTE — Progress Notes (Signed)
[facility] PREP Evaluation  Patient Details  Name: CADI RHINEHART MRN: [id number] Date of Birth: [birth year] Age: 81 y.o. PCP: Glendale Chard, MD  Vitals:   09/14/21 1346  BP: (!) 118/56  Pulse: (!) 52  SpO2: 99%  Weight: 128 lb (58.1 kg)     [facility] Eval - 09/14/21 1300       [facility] "PREP" Location   [facility]" Location [facility]      Referral    Referring Provider Oval Linsey    Program Start Date 09/14/21   Program end date     Measurement   Waist Circumference 31.5 inches    Hip Circumference 36 inches    Body fat 20.5 percent      Mobility and Daily Activities   I find it easy to walk up or down two or more flights of stairs. 3    I have no trouble taking out the trash. 4    I do housework such as vacuuming and dusting on my own without difficulty. 4    I can easily lift a gallon of milk (8lbs). 4    I can easily walk a mile. 4    I have no trouble reaching into high cupboards or reaching down to pick up something from the floor. 4    I do not have trouble doing out-door work such as Armed forces logistics/support/administrative officer, raking leaves, or gardening. 4      Mobility and Daily Activities   I feel younger than my age. 3    I feel independent. 4    I feel energetic. 4    I live an active life.  3    I feel strong. 3    I feel healthy. 4    I feel active as other people my age. 4      How fit and strong are you.   Fit and Strong Total Score 52            Past Medical History:  Diagnosis Date   Allergy    Diabetes mellitus    GERD (gastroesophageal reflux disease)    Glaucoma    right eye   Hyperlipidemia    Leukopenia    Past Surgical History:  Procedure Laterality Date   APPENDECTOMY     COLONOSCOPY     TONSILLECTOMY     TUBAL LIGATION     VAGINAL HYSTERECTOMY     Social History   Tobacco Use  Smoking Status Never  Smokeless Tobacco Never  How fit and strong survey: 06/20/2021: 51 09/14/2021:52 Education sessions completed: 12 Workout sessions completed:  12   Loreley Schwall B Ahleah Simko 09/14/2021, 1:47 PM

## 2021-09-26 DIAGNOSIS — H04123 Dry eye syndrome of bilateral lacrimal glands: Secondary | ICD-10-CM | POA: Diagnosis not present

## 2021-09-26 DIAGNOSIS — H401111 Primary open-angle glaucoma, right eye, mild stage: Secondary | ICD-10-CM | POA: Diagnosis not present

## 2021-09-26 DIAGNOSIS — H401122 Primary open-angle glaucoma, left eye, moderate stage: Secondary | ICD-10-CM | POA: Diagnosis not present

## 2021-09-26 DIAGNOSIS — E119 Type 2 diabetes mellitus without complications: Secondary | ICD-10-CM | POA: Diagnosis not present

## 2021-10-09 ENCOUNTER — Ambulatory Visit (INDEPENDENT_AMBULATORY_CARE_PROVIDER_SITE_OTHER): Payer: Medicare Other | Admitting: Internal Medicine

## 2021-10-09 ENCOUNTER — Encounter: Payer: Self-pay | Admitting: Internal Medicine

## 2021-10-09 ENCOUNTER — Other Ambulatory Visit: Payer: Self-pay

## 2021-10-09 VITALS — BP 112/74 | HR 50 | Temp 98.4°F | Ht 59.0 in | Wt 128.4 lb

## 2021-10-09 DIAGNOSIS — E1122 Type 2 diabetes mellitus with diabetic chronic kidney disease: Secondary | ICD-10-CM

## 2021-10-09 DIAGNOSIS — Z6825 Body mass index (BMI) 25.0-25.9, adult: Secondary | ICD-10-CM

## 2021-10-09 DIAGNOSIS — L299 Pruritus, unspecified: Secondary | ICD-10-CM | POA: Diagnosis not present

## 2021-10-09 DIAGNOSIS — Z79899 Other long term (current) drug therapy: Secondary | ICD-10-CM

## 2021-10-09 DIAGNOSIS — Z23 Encounter for immunization: Secondary | ICD-10-CM | POA: Diagnosis not present

## 2021-10-09 DIAGNOSIS — N182 Chronic kidney disease, stage 2 (mild): Secondary | ICD-10-CM

## 2021-10-09 DIAGNOSIS — E663 Overweight: Secondary | ICD-10-CM | POA: Diagnosis not present

## 2021-10-09 NOTE — Progress Notes (Signed)
?[employer] as a Education administrator for Ashley Greenland, MD.,have documented all relevant documentation on the behalf of Ashley Greenland, MD,as directed by  Ashley Greenland, MD while in the presence of Ashley Greenland, MD.  ?This visit occurred during the SARS-CoV-2 public health emergency.  Safety protocols were in place, including screening questions prior to the visit, additional usage of staff PPE, and extensive cleaning of exam room while observing appropriate contact time as indicated for disinfecting solutions. ? ?Subjective:  ?  ? Patient ID: Ashley Lyons , female    DOB: 12-Feb-1941 , 81 y.o.   MRN: [id number] ? ? ?Chief Complaint  ?Patient presents with  ? Diabetes  ? ? ?HPI ? ?She is here today for BP check/kidney check. She was started on valsartan 26m at her last visit for renal protection due to DM. She has been taking 1/2 pill daily. She denies having any issues with the medication. She has noticed some itching, but at the moment she does not think it is due to the medication. She states there is a possibility that her symptoms were due to something she ate.  ?  ? ?Past Medical History:  ?Diagnosis Date  ? Allergy   ? Diabetes mellitus   ? GERD (gastroesophageal reflux disease)   ? Glaucoma   ? right eye  ? Hyperlipidemia   ? Leukopenia   ?  ? ?Family History  ?Problem Relation Age of Onset  ? Prostate cancer Father   ? Diabetes Mother   ? Prostate cancer Brother   ? Kidney disease Sister   ? Liver disease Brother   ? Colon cancer Brother   ? Esophageal cancer Neg Hx   ? Rectal cancer Neg Hx   ? Stomach cancer Neg Hx   ? ? ? ?Current Outpatient Medications:  ?  Barberry-Oreg Grape-Goldenseal (BERBERINE COMPLEX PO), Take 500 mg by mouth., Disp: , Rfl:  ?  BIOTIN PO, Take 100 mg by mouth. , Disp: , Rfl:  ?  CALCIUM PO, Take 1,000 mg by mouth. , Disp: , Rfl:  ?  Cholecalciferol (VITAMIN D3) 50 MCG (2000 UT) capsule, Take 2,000 Units by mouth daily., Disp: , Rfl:  ?  co-enzyme Q-10 30 MG capsule, Take  30 mg by mouth daily. , Disp: , Rfl:  ?  DiphenhydrAMINE HCl (BENADRYL ALLERGY PO), Take 25 mg by mouth as needed., Disp: , Rfl:  ?  hydrocortisone 2.5 % cream, Apply to face twice daily for 2 wks, then daily for 2 wks, then daily as needed for itching, Disp: , Rfl:  ?  Multiple Vitamin (MULTIVITAMIN) tablet, Take 1 tablet by mouth daily., Disp: , Rfl:  ?  omega-3 acid ethyl esters (LOVAZA) 1 g capsule, Take by mouth daily. , Disp: , Rfl:  ?  pravastatin (PRAVACHOL) 40 MG tablet, Take 1 tablet by mouth once daily, Disp: 90 tablet, Rfl: 3 ?  SELENIUM PO, Take by mouth. 200 mg, Disp: , Rfl:  ?  SPIRULINA PO, Take by mouth. As needed, Disp: , Rfl:  ?  timolol (TIMOPTIC) 0.5 % ophthalmic solution, , Disp: , Rfl:  ?  valsartan (DIOVAN) 40 MG tablet, Take 1 tablet (40 mg total) by mouth daily., Disp: 30 tablet, Rfl: 11 ?  vitamin C (ASCORBIC ACID) 500 MG tablet, Take 500 mg by mouth daily., Disp: , Rfl:   ? ?Allergies  ?Allergen Reactions  ? Almond Oil Hives  ?  Almonds  ? Bean Pod Extract Hives  ?  String Beans  ? Corn-Containing Products Hives  ? Peanuts [Peanut Oil] Hives  ? Sodium Laureth Sulfate Hives  ? Soy Allergy Hives  ? Strawberry Extract Hives  ?  ? ?Review of Systems  ?Constitutional: Negative.   ?Respiratory: Negative.    ?Cardiovascular: Negative.   ?Gastrointestinal: Negative.   ?Neurological: Negative.   ?Psychiatric/Behavioral: Negative.     ? ?Today's Vitals  ? 10/09/21 1509  ?BP: 112/74  ?Pulse: (!) 50  ?Temp: 98.4 ?F (36.9 ?C)  ?Weight: 128 lb 6.4 oz (58.2 kg)  ?Height: _0  (1.499 m)  ? ?Body mass index is 25.93 kg/m?.  ?Wt Readings from Last 3 Encounters:  ?10/09/21 128 lb 6.4 oz (58.2 kg)  ?09/14/21 128 lb (58.1 kg)  ?09/12/21 128 lb 8 oz (58.3 kg)  ?  ?BP Readings from Last 3 Encounters:  ?10/09/21 112/74  ?09/14/21 (!) 118/56  ?09/11/21 114/74  ?  ?Objective:  ?Physical Exam ?Vitals and nursing note reviewed.  ?Constitutional:   ?   Appearance: Normal appearance.  ?HENT:  ?   Head: Normocephalic  and atraumatic.  ?   Nose:  ?   Comments: Masked  ?   Mouth/Throat:  ?   Comments: Masked  ?Eyes:  ?   Extraocular Movements: Extraocular movements intact.  ?Cardiovascular:  ?   Rate and Rhythm: Normal rate and regular rhythm.  ?   Heart sounds: Normal heart sounds.  ?Pulmonary:  ?   Effort: Pulmonary effort is normal.  ?   Breath sounds: Normal breath sounds.  ?Musculoskeletal:  ?   Cervical back: Normal range of motion.  ?Skin: ?   General: Skin is warm.  ?Neurological:  ?   General: No focal deficit present.  ?   Mental Status: She is alert.  ?Psychiatric:     ?   Mood and Affect: Mood normal.     ?   Behavior: Behavior normal.  ?    ?Assessment And Plan:  ?   ?1. Type 2 diabetes mellitus with stage 2 chronic kidney disease, without long-term current use of insulin ([place]) ?Comments: I will check BMP today, she will c/w 1/2 tab daily valsartan 46m for now. I wil check microalbumin later this year.  ? ?2. Pruritus ?Comments: She has h/o many allergies.  She will let me know if her sx persist.  ? ?3. Overweight with body mass index (BMI) of 25 to 25.9 in adult ?Comments:  She is advised to aim for at least 150 minutes of exercise per week.  ? ?4. Drug therapy ?- BMP8+EGFR ? ?5. Need for vaccination ?Comments: She was given Shingrix IM x 1. This will be billed via [facility].  ?- Varicella-zoster vaccine IM (Shingrix) ? ?Patient was given opportunity to ask questions. Patient verbalized understanding of the plan and was able to repeat key elements of the plan. All questions were answered to their satisfaction.  ? ?I, RMaximino Greenland MD, have reviewed all documentation for this visit. The documentation on 10/09/21 for the exam, diagnosis, procedures, and orders are all accurate and complete.  ? ?IF YOU HAVE BEEN REFERRED TO A SPECIALIST, IT MAY TAKE 1-2 WEEKS TO SCHEDULE/PROCESS THE REFERRAL. IF YOU HAVE NOT HEARD FROM US/SPECIALIST IN TWO WEEKS, PLEASE GIVE [place] CALL AT [phone] X [phone].  ? ?THE PATIENT IS  ENCOURAGED TO PRACTICE SOCIAL DISTANCING DUE TO THE COVID-19 PANDEMIC.   ?

## 2021-10-09 NOTE — Patient Instructions (Signed)
Mediterranean Diet °A Mediterranean diet refers to food and lifestyle choices that are based on the traditions of countries located on the [place] Sea. It focuses on eating more fruits, vegetables, whole grains, beans, nuts, seeds, and heart-healthy fats, and eating less dairy, meat, eggs, and processed foods with added sugar, salt, and fat. This way of eating has been shown to help prevent certain conditions and improve outcomes for people who have chronic diseases, like kidney disease and heart disease. °What are tips for following this plan? °Reading food labels °Check the serving size of packaged foods. For foods such as rice and pasta, the serving size refers to the amount of cooked product, not dry. °Check the total fat in packaged foods. Avoid foods that have saturated fat or trans fats. °Check the ingredient list for added sugars, such as corn syrup. °Shopping ° °Buy a variety of foods that offer a balanced diet, including: °Fresh fruits and vegetables (produce). °Grains, beans, nuts, and seeds. Some of these may be available in unpackaged forms or large amounts (in bulk). °Fresh seafood. °Poultry and eggs. °Low-fat dairy products. °Buy whole ingredients instead of prepackaged foods. °Buy fresh fruits and vegetables in-season from local farmers markets. °Buy plain frozen fruits and vegetables. °If you do not have access to quality fresh seafood, buy precooked frozen shrimp or canned fish, such as tuna, salmon, or sardines. °Stock your pantry so you always have certain foods on hand, such as olive oil, canned tuna, canned tomatoes, rice, pasta, and beans. °Cooking °Cook foods with extra-virgin olive oil instead of using butter or other vegetable oils. °Have meat as a side dish, and have vegetables or grains as your main dish. This means having meat in small portions or adding small amounts of meat to foods like pasta or stew. °Use beans or vegetables instead of meat in common dishes like chili or  lasagna. °Experiment with different cooking methods. Try roasting, broiling, steaming, and sautéing vegetables. °Add frozen vegetables to soups, stews, pasta, or rice. °Add nuts or seeds for added healthy fats and plant protein at each meal. You can add these to yogurt, salads, or vegetable dishes. °Marinate fish or vegetables using olive oil, lemon juice, garlic, and fresh herbs. °Meal planning °Plan to eat one vegetarian meal one day each week. Try to work up to two vegetarian meals, if possible. °Eat seafood two or more times a week. °Have healthy snacks readily available, such as: °Vegetable sticks with hummus. °Greek yogurt. °Fruit and nut trail mix. °Eat balanced meals throughout the week. This includes: °Fruit: 2-3 servings a day. °Vegetables: 4-5 servings a day. °Low-fat dairy: 2 servings a day. °Fish, poultry, or lean meat: 1 serving a day. °Beans and legumes: 2 or more servings a week. °Nuts and seeds: 1-2 servings a day. °Whole grains: 6-8 servings a day. °Extra-virgin olive oil: 3-4 servings a day. °Limit red meat and sweets to only a few servings a month. °Lifestyle ° °Cook and eat meals together with your family, when possible. °Drink enough fluid to keep your urine pale yellow. °Be physically active every day. This includes: °Aerobic exercise like running or swimming. °Leisure activities like gardening, walking, or housework. °Get 7-8 hours of sleep each night. °If recommended by your health care provider, drink red wine in moderation. This means 1 glass a day for nonpregnant women and 2 glasses a day for men. A glass of wine equals 5 oz (150 mL). °What foods should I eat? °Fruits °Apples. Apricots. Avocado. Berries. Bananas. Cherries. Dates.   Figs. Grapes. Lemons. Melon. Oranges. Peaches. Plums. Pomegranate. °Vegetables °Artichokes. Beets. Broccoli. Cabbage. Carrots. Eggplant. Green beans. Chard. Kale. Spinach. Onions. Leeks. Peas. Squash. Tomatoes. Peppers. Radishes. °Grains °Whole-grain pasta. Brown  rice. Bulgur wheat. Polenta. Couscous. Whole-wheat bread. Oatmeal. Quinoa. °Meats and other proteins °Beans. Almonds. Sunflower seeds. Pine nuts. Peanuts. Cod. Salmon. Scallops. Shrimp. Tuna. Tilapia. Clams. Oysters. Eggs. Poultry without skin. °Dairy °Low-fat milk. Cheese. Greek yogurt. °Fats and oils °Extra-virgin olive oil. Avocado oil. Grapeseed oil. °Beverages °Water. Red wine. Herbal tea. °Sweets and desserts °Greek yogurt with honey. Baked apples. Poached pears. Trail mix. °Seasonings and condiments °Basil. Cilantro. Coriander. Cumin. Mint. Parsley. Sage. Rosemary. Tarragon. Garlic. Oregano. Thyme. Pepper. Balsamic vinegar. Tahini. Hummus. Tomato sauce. Olives. Mushrooms. °The items listed above may not be a complete list of foods and beverages you can eat. Contact a dietitian for more information. °What foods should I limit? °This is a list of foods that should be eaten rarely or only on special occasions. °Fruits °Fruit canned in syrup. °Vegetables °Deep-fried potatoes (french fries). °Grains °Prepackaged pasta or rice dishes. Prepackaged cereal with added sugar. Prepackaged snacks with added sugar. °Meats and other proteins °Beef. Pork. Lamb. Poultry with skin. Hot dogs. Bacon. °Dairy °Ice cream. Sour cream. Whole milk. °Fats and oils °Butter. Canola oil. Vegetable oil. Beef fat (tallow). Lard. °Beverages °Juice. Sugar-sweetened soft drinks. Beer. Liquor and spirits. °Sweets and desserts °Cookies. Cakes. Pies. Candy. °Seasonings and condiments °Mayonnaise. Pre-made sauces and marinades. °The items listed above may not be a complete list of foods and beverages you should limit. Contact a dietitian for more information. °Summary °The Mediterranean diet includes both food and lifestyle choices. °Eat a variety of fresh fruits and vegetables, beans, nuts, seeds, and whole grains. °Limit the amount of red meat and sweets that you eat. °If recommended by your health care provider, drink red wine in moderation.  This means 1 glass a day for nonpregnant women and 2 glasses a day for men. A glass of wine equals 5 oz (150 mL). °This information is not intended to replace advice given to you by your health care provider. Make sure you discuss any questions you have with your health care provider. °Document Revised: 08/28/2019 Document Reviewed: 06/25/2019 °Elsevier Patient Education © 2022 [employer] Inc. ° °

## 2021-10-10 LAB — BMP8+EGFR
BUN/Creatinine Ratio: 18 (ref 12–28)
BUN: 13 mg/dL (ref 8–27)
CO2: 23 mmol/L (ref 20–29)
Calcium: 9.1 mg/dL (ref 8.7–10.3)
Chloride: 103 mmol/L (ref 96–106)
Creatinine, Ser: 0.71 mg/dL (ref 0.57–1.00)
Glucose: 104 mg/dL — ABNORMAL HIGH (ref 70–99)
Potassium: 4.8 mmol/L (ref 3.5–5.2)
Sodium: 141 mmol/L (ref 134–144)
eGFR: 85 mL/min/{1.73_m2} (ref >59)

## 2022-01-18 ENCOUNTER — Ambulatory Visit (INDEPENDENT_AMBULATORY_CARE_PROVIDER_SITE_OTHER): Payer: Medicare Other | Admitting: Internal Medicine

## 2022-01-18 ENCOUNTER — Encounter: Payer: Self-pay | Admitting: Internal Medicine

## 2022-01-18 VITALS — BP 116/78 | HR 44 | Temp 97.7°F | Ht 59.0 in | Wt 127.8 lb

## 2022-01-18 DIAGNOSIS — E1122 Type 2 diabetes mellitus with diabetic chronic kidney disease: Secondary | ICD-10-CM | POA: Diagnosis not present

## 2022-01-18 DIAGNOSIS — R1031 Right lower quadrant pain: Secondary | ICD-10-CM

## 2022-01-18 DIAGNOSIS — N182 Chronic kidney disease, stage 2 (mild): Secondary | ICD-10-CM | POA: Diagnosis not present

## 2022-01-18 DIAGNOSIS — Z6825 Body mass index (BMI) 25.0-25.9, adult: Secondary | ICD-10-CM

## 2022-01-18 DIAGNOSIS — Z23 Encounter for immunization: Secondary | ICD-10-CM | POA: Diagnosis not present

## 2022-01-18 MED ORDER — TELMISARTAN 20 MG PO TABS: 20.0000 mg | ORAL_TABLET | Freq: Every day | ORAL | 1 refills | Status: DC

## 2022-01-18 NOTE — Patient Instructions (Signed)

## 2022-01-18 NOTE — Progress Notes (Signed)
[place]ve [place],acting as a Education administrator for [name], MD.,have documented all relevant documentation on the behalf of [name], MD,as directed by  [name], MD while in the presence of [name], MD.  This visit occurred during the SARS-CoV-2 public health emergency.  Safety protocols were in place, including screening questions prior to the visit, additional usage of staff PPE, and extensive cleaning of exam room while observing appropriate contact time as indicated for disinfecting solutions.  Subjective:     Patient ID: Ashley Lyons , female    DOB: Apr 10, 1941 , 81 y.o.   MRN: [id number]   Chief Complaint  Patient presents with   Diabetes    HPI  She is here today for DM check. Patient reports compliance with her meds. She denies headaches, chest pain and shortness of breath.   Diabetes She presents for her follow-up diabetic visit. She has type 2 diabetes mellitus. Her disease course has been stable. There are no hypoglycemic associated symptoms. Pertinent negatives for diabetes include no polydipsia, no polyphagia and no polyuria. There are no hypoglycemic complications. Risk factors for coronary artery disease include dyslipidemia, diabetes mellitus, sedentary lifestyle and post-menopausal. She is following a generally healthy diet. She participates in exercise intermittently. There is no change in her home blood glucose trend. Her breakfast blood glucose is taken between 7-8 am. Her breakfast blood glucose range is generally 90-110 mg/dl. An ACE inhibitor/angiotensin II receptor blocker is not being taken. Eye exam is current.  Hyperlipidemia This is a chronic problem. The problem is controlled. Exacerbating diseases include diabetes. Current antihyperlipidemic treatment includes statins. Risk factors for coronary artery disease include diabetes mellitus, dyslipidemia and post-menopausal.     Past Medical History:  Diagnosis Date   Allergy    Diabetes  mellitus    GERD (gastroesophageal reflux disease)    Glaucoma    right eye   Hyperlipidemia    Leukopenia      Family History  Problem Relation Age of Onset   Prostate cancer Father    Diabetes Mother    Prostate cancer Brother    Kidney disease Sister    Liver disease Brother    Colon cancer Brother    Esophageal cancer Neg Hx    Rectal cancer Neg Hx    Stomach cancer Neg Hx      Current Outpatient Medications:    Barberry-Oreg Grape-Goldenseal (BERBERINE COMPLEX PO), Take 500 mg by mouth., Disp: , Rfl:    BIOTIN PO, Take 100 mg by mouth. , Disp: , Rfl:    CALCIUM PO, Take 1,000 mg by mouth. , Disp: , Rfl:    Cholecalciferol (VITAMIN D3) 50 MCG (2000 UT) capsule, Take 2,000 Units by mouth daily., Disp: , Rfl:    co-enzyme Q-10 30 MG capsule, Take 30 mg by mouth daily. , Disp: , Rfl:    DiphenhydrAMINE HCl (BENADRYL ALLERGY PO), Take 25 mg by mouth as needed., Disp: , Rfl:    hydrocortisone 2.5 % cream, Apply to face twice daily for 2 wks, then daily for 2 wks, then daily as needed for itching, Disp: , Rfl:    Multiple Vitamin (MULTIVITAMIN) tablet, Take 1 tablet by mouth daily., Disp: , Rfl:    pravastatin (PRAVACHOL) 40 MG tablet, Take 1 tablet by mouth once daily, Disp: 90 tablet, Rfl: 3   SELENIUM PO, Take by mouth. 200 mg, Disp: , Rfl:    telmisartan (MICARDIS) 20 MG tablet, Take 1 tablet (20 mg total)  by mouth daily., Disp: 30 tablet, Rfl: 1   timolol (TIMOPTIC) 0.5 % ophthalmic solution, , Disp: , Rfl:    vitamin C (ASCORBIC ACID) 500 MG tablet, Take 500 mg by mouth daily., Disp: , Rfl:    SPIRULINA PO, Take by mouth. As needed (Patient not taking: Reported on 01/18/2022), Disp: , Rfl:    Allergies  Allergen Reactions   Almond Oil Hives    Almonds   Bean Pod Extract Hives    String Beans   Corn-Containing Products Hives   Peanuts [Peanut Oil] Hives   Sodium Laureth Sulfate Hives   Soy Allergy Hives   Strawberry Extract Hives     Review of Systems   Constitutional: Negative.   Respiratory: Negative.    Cardiovascular: Negative.   Gastrointestinal: Negative.        She c/o abdominal discomfort, mostly in right lower quadrant. She describes this as a dull ache. She thinks her symptoms started about two weeks ago. She is not sure about what triggers her sx.  Thinks it can be due to gas. This is a daily occurrence, feels better with gas/bowel movement. She does report being less active over the past two weeks. No associated n/v/d.   Endocrine: Negative for polydipsia, polyphagia and polyuria.  Neurological: Negative.   Psychiatric/Behavioral: Negative.      Today's Vitals   01/18/22 1035  BP: 116/78  Pulse: (!) 44  Temp: 97.7 F (36.5 C)  Weight: 127 lb 12.8 oz (58 kg)  Height: _0  (1.499 m)  PainSc: 0-No pain   Body mass index is 25.81 kg/m.  Wt Readings from Last 3 Encounters:  01/18/22 127 lb 12.8 oz (58 kg)  10/09/21 128 lb 6.4 oz (58.2 kg)  09/14/21 128 lb (58.1 kg)    Objective:  Physical Exam Vitals and nursing note reviewed.  Constitutional:      Appearance: Normal appearance.  HENT:     Head: Normocephalic and atraumatic.  Eyes:     Extraocular Movements: Extraocular movements intact.  Cardiovascular:     Rate and Rhythm: Normal rate and regular rhythm.     Heart sounds: Normal heart sounds.  Pulmonary:     Effort: Pulmonary effort is normal.     Breath sounds: Normal breath sounds.  Abdominal:     General: Bowel sounds are normal.     Palpations: Abdomen is soft.     Tenderness: There is no abdominal tenderness. There is no rebound.  Musculoskeletal:     Cervical back: Normal range of motion.  Skin:    General: Skin is warm.     Findings: Rash present.     Comments: She was started on valsartan at her last visit for renal protection. Unfortunately, she did develop an intermittent faint rash. She thought it was related to a food she ate because she has many allergies.   Neurological:     General: No  focal deficit present.     Mental Status: She is alert.  Psychiatric:        Mood and Affect: Mood normal.        Behavior: Behavior normal.      Assessment And Plan:     1. Type 2 diabetes mellitus with stage 2 chronic kidney disease, without long-term current use of insulin ([place]) Comments: I will check labs as below. Had rash with Valsartan nightly, will switch to telmisartan 25m nightly. She will f/u in 6 weeks.  - Hemoglobin A1c - CMP14+EGFR  2. RLQ  discomfort Comments: She has benign exam. I think her sx are related to gas. She is encouraged to resume her regular activity, take Pepcid as needed and let me know if sx persist.   3. BMI 25.0-25.9,adult Comments: She is encouraged to aim for at least 150 minutes of exercise per week.   4. Immunization due - Pneumococcal polysaccharide vaccine 23-valent greater than or equal to 2yo subcutaneous/IM   Patient was given opportunity to ask questions. Patient verbalized understanding of the plan and was able to repeat key elements of the plan. All questions were answered to their satisfaction.   I, [name], MD, have reviewed all documentation for this visit. The documentation on 01/18/22 for the exam, diagnosis, procedures, and orders are all accurate and complete.   IF YOU HAVE BEEN REFERRED TO A SPECIALIST, IT MAY TAKE 1-2 WEEKS TO SCHEDULE/PROCESS THE REFERRAL. IF YOU HAVE NOT HEARD FROM US/SPECIALIST IN TWO WEEKS, PLEASE GIVE [place] A CALL AT [phone] X [phone].   THE PATIENT IS ENCOURAGED TO PRACTICE SOCIAL DISTANCING DUE TO THE COVID-19 PANDEMIC.

## 2022-01-19 LAB — CMP14+EGFR
ALT: 11 IU/L (ref 0–32)
AST: 19 IU/L (ref 0–40)
Albumin/Globulin Ratio: 1.4 (ref 1.2–2.2)
Albumin: 4.2 g/dL (ref 3.6–4.6)
Alkaline Phosphatase: 58 IU/L (ref 44–121)
BUN/Creatinine Ratio: 13 (ref 12–28)
BUN: 11 mg/dL (ref 8–27)
Bilirubin Total: 0.6 mg/dL (ref 0.0–1.2)
CO2: 23 mmol/L (ref 20–29)
Calcium: 9.3 mg/dL (ref 8.7–10.3)
Chloride: 103 mmol/L (ref 96–106)
Creatinine, Ser: 0.82 mg/dL (ref 0.57–1.00)
Globulin, Total: 3 g/dL (ref 1.5–4.5)
Glucose: 108 mg/dL — ABNORMAL HIGH (ref 70–99)
Potassium: 4.6 mmol/L (ref 3.5–5.2)
Sodium: 140 mmol/L (ref 134–144)
Total Protein: 7.2 g/dL (ref 6.0–8.5)
eGFR: 71 mL/min/{1.73_m2} (ref >59)

## 2022-01-19 LAB — HEMOGLOBIN A1C
Est. average glucose Bld gHb Est-mCnc: 143 mg/dL
Hgb A1c MFr Bld: 6.6 % — ABNORMAL HIGH (ref 4.8–5.6)

## 2022-05-01 DIAGNOSIS — H26492 Other secondary cataract, left eye: Secondary | ICD-10-CM | POA: Diagnosis not present

## 2022-05-01 DIAGNOSIS — H04123 Dry eye syndrome of bilateral lacrimal glands: Secondary | ICD-10-CM | POA: Diagnosis not present

## 2022-05-01 DIAGNOSIS — H401111 Primary open-angle glaucoma, right eye, mild stage: Secondary | ICD-10-CM | POA: Diagnosis not present

## 2022-05-01 DIAGNOSIS — H401122 Primary open-angle glaucoma, left eye, moderate stage: Secondary | ICD-10-CM | POA: Diagnosis not present

## 2022-05-07 ENCOUNTER — Other Ambulatory Visit: Payer: Self-pay | Admitting: Internal Medicine

## 2022-05-10 ENCOUNTER — Encounter: Payer: Self-pay | Admitting: Gastroenterology

## 2022-05-23 ENCOUNTER — Ambulatory Visit: Payer: Medicare Other | Admitting: Internal Medicine

## 2022-05-23 ENCOUNTER — Ambulatory Visit (INDEPENDENT_AMBULATORY_CARE_PROVIDER_SITE_OTHER): Payer: Medicare Other

## 2022-05-23 VITALS — BP 124/70 | HR 56 | Temp 98.3°F | Ht 59.8 in | Wt 129.4 lb

## 2022-05-23 DIAGNOSIS — Z23 Encounter for immunization: Secondary | ICD-10-CM

## 2022-05-23 DIAGNOSIS — Z0000 Encounter for general adult medical examination without abnormal findings: Secondary | ICD-10-CM | POA: Diagnosis not present

## 2022-05-23 LAB — HM DIABETES EYE EXAM

## 2022-05-23 NOTE — Patient Instructions (Signed)
Ashley Lyons , Thank you for taking time to come for your Medicare Wellness Visit. I appreciate your ongoing commitment to your health goals. Please review the following plan we discussed and let me know if I can assist you in the future.   Screening recommendations/referrals: Colonoscopy: not required Mammogram: not required Bone Density: completed 06/23/2018 Recommended yearly ophthalmology/optometry visit for glaucoma screening and checkup Recommended yearly dental visit for hygiene and checkup  Vaccinations: Influenza vaccine: today Pneumococcal vaccine: completed 01/18/2022 Tdap vaccine: completed 10/27/2012, due 10/28/2022 Shingles vaccine: completed   Covid-19: 12/30/2020, 07/12/2020, 11/10/2019, 10/17/2019  Advanced directives: Please bring a copy of your POA (Power of Attorney) and/or Living Will to your next appointment.    Conditions/risks identified: none  Next appointment: Follow up in one year for your annual wellness visit    Preventive Care 65 Years and Older, Female Preventive care refers to lifestyle choices and visits with your health care provider that can promote health and wellness. What does preventive care include? A yearly physical exam. This is also called an annual well check. Dental exams once or twice a year. Routine eye exams. Ask your health care provider how often you should have your eyes checked. Personal lifestyle choices, including: Daily care of your teeth and gums. Regular physical activity. Eating a healthy diet. Avoiding tobacco and drug use. Limiting alcohol use. Practicing safe sex. Taking low-dose aspirin every day. Taking vitamin and mineral supplements as recommended by your health care provider. What happens during an annual well check? The services and screenings done by your health care provider during your annual well check will depend on your age, overall health, lifestyle risk factors, and family history of disease. Counseling  Your  health care provider may ask you questions about your: Alcohol use. Tobacco use. Drug use. Emotional well-being. Home and relationship well-being. Sexual activity. Eating habits. History of falls. Memory and ability to understand (cognition). Work and work Statistician. Reproductive health. Screening  You may have the following tests or measurements: Height, weight, and BMI. Blood pressure. Lipid and cholesterol levels. These may be checked every 5 years, or more frequently if you are over 14 years old. Skin check. Lung cancer screening. You may have this screening every year starting at age 82 if you have a 30-pack-year history of smoking and currently smoke or have quit within the past 15 years. Fecal occult blood test (FOBT) of the stool. You may have this test every year starting at age 25. Flexible sigmoidoscopy or colonoscopy. You may have a sigmoidoscopy every 5 years or a colonoscopy every 10 years starting at age 36. Hepatitis C blood test. Hepatitis B blood test. Sexually transmitted disease (STD) testing. Diabetes screening. This is done by checking your blood sugar (glucose) after you have not eaten for a while (fasting). You may have this done every 1-3 years. Bone density scan. This is done to screen for osteoporosis. You may have this done starting at age 32. Mammogram. This may be done every 1-2 years. Talk to your health care provider about how often you should have regular mammograms. Talk with your health care provider about your test results, treatment options, and if necessary, the need for more tests. Vaccines  Your health care provider may recommend certain vaccines, such as: Influenza vaccine. This is recommended every year. Tetanus, diphtheria, and acellular pertussis (Tdap, Td) vaccine. You may need a Td booster every 10 years. Zoster vaccine. You may need this after age 17. Pneumococcal 13-valent conjugate (PCV13) vaccine. One  dose is recommended after age  21. Pneumococcal polysaccharide (PPSV23) vaccine. One dose is recommended after age 20. Talk to your health care provider about which screenings and vaccines you need and how often you need them. This information is not intended to replace advice given to you by your health care provider. Make sure you discuss any questions you have with your health care provider. Document Released: 08/19/2015 Document Revised: 04/11/2016 Document Reviewed: 05/24/2015 Elsevier Interactive Patient Education  2017 [place] Prevention in the Home Falls can cause injuries. They can happen to people of all ages. There are many things you can do to make your home safe and to help prevent falls. What can I do on the outside of my home? Regularly fix the edges of walkways and driveways and fix any cracks. Remove anything that might make you trip as you walk through a door, such as a raised step or threshold. Trim any bushes or trees on the path to your home. Use bright outdoor lighting. Clear any walking paths of anything that might make someone trip, such as rocks or tools. Regularly check to see if handrails are loose or broken. Make sure that both sides of any steps have handrails. Any raised decks and porches should have guardrails on the edges. Have any leaves, snow, or ice cleared regularly. Use sand or salt on walking paths during winter. Clean up any spills in your garage right away. This includes oil or grease spills. What can I do in the bathroom? Use night lights. Install grab bars by the toilet and in the tub and shower. Do not use towel bars as grab bars. Use non-skid mats or decals in the tub or shower. If you need to sit down in the shower, use a plastic, non-slip stool. Keep the floor dry. Clean up any water that spills on the floor as soon as it happens. Remove soap buildup in the tub or shower regularly. Attach bath mats securely with double-sided non-slip rug tape. Do not have throw  rugs and other things on the floor that can make you trip. What can I do in the bedroom? Use night lights. Make sure that you have a light by your bed that is easy to reach. Do not use any sheets or blankets that are too big for your bed. They should not hang down onto the floor. Have a firm chair that has side arms. You can use this for support while you get dressed. Do not have throw rugs and other things on the floor that can make you trip. What can I do in the kitchen? Clean up any spills right away. Avoid walking on wet floors. Keep items that you use a lot in easy-to-reach places. If you need to reach something above you, use a strong step stool that has a grab bar. Keep electrical cords out of the way. Do not use floor polish or wax that makes floors slippery. If you must use wax, use non-skid floor wax. Do not have throw rugs and other things on the floor that can make you trip. What can I do with my stairs? Do not leave any items on the stairs. Make sure that there are handrails on both sides of the stairs and use them. Fix handrails that are broken or loose. Make sure that handrails are as long as the stairways. Check any carpeting to make sure that it is firmly attached to the stairs. Fix any carpet that is loose or worn.  Avoid having throw rugs at the top or bottom of the stairs. If you do have throw rugs, attach them to the floor with carpet tape. Make sure that you have a light switch at the top of the stairs and the bottom of the stairs. If you do not have them, ask someone to add them for you. What else can I do to help prevent falls? Wear shoes that: Do not have high heels. Have rubber bottoms. Are comfortable and fit you well. Are closed at the toe. Do not wear sandals. If you use a stepladder: Make sure that it is fully opened. Do not climb a closed stepladder. Make sure that both sides of the stepladder are locked into place. Ask someone to hold it for you, if  possible. Clearly mark and make sure that you can see: Any grab bars or handrails. First and last steps. Where the edge of each step is. Use tools that help you move around (mobility aids) if they are needed. These include: Canes. Walkers. Scooters. Crutches. Turn on the lights when you go into a dark area. Replace any light bulbs as soon as they burn out. Set up your furniture so you have a clear path. Avoid moving your furniture around. If any of your floors are uneven, fix them. If there are any pets around you, be aware of where they are. Review your medicines with your doctor. Some medicines can make you feel dizzy. This can increase your chance of falling. Ask your doctor what other things that you can do to help prevent falls. This information is not intended to replace advice given to you by your health care provider. Make sure you discuss any questions you have with your health care provider. Document Released: 05/19/2009 Document Revised: 12/29/2015 Document Reviewed: 08/27/2014 Elsevier Interactive Patient Education  2017 [employer].

## 2022-05-23 NOTE — Progress Notes (Signed)
Subjective:   Ashley Lyons is a 81 y.o. female who presents for Medicare Annual (Subsequent) preventive examination.  Review of Systems     Cardiac Risk Factors include: advanced age (>75mn, >>24women);diabetes mellitus     Objective:    Today's Vitals   05/23/22 0826  BP: 124/70  Pulse: (!) 56  Temp: 98.3 F (36.8 C)  TempSrc: Oral  SpO2: 97%  Weight: 129 lb 6.4 oz (58.7 kg)  Height: 4' 11.8" (1.519 m)   Body mass index is 25.44 kg/m.     05/23/2022    8:38 AM 04/26/2021    8:51 AM 04/20/2020    8:54 AM 04/15/2019    8:50 AM  Advanced Directives  Does Patient Have a Medical Advance Directive? Yes Yes Yes Yes  Type of AParamedicof AFort MontgomeryLiving will H[place]okot[place]Living will [place]Living will [place]Living will  Copy of HStellain Chart? No - copy requested No - copy requested No - copy requested No - copy requested    Current Medications (verified) Outpatient Encounter Medications as of 05/23/2022  Medication Sig   Barberry-Oreg Grape-Goldenseal (BERBERINE COMPLEX PO) Take 500 mg by mouth.   BIOTIN PO Take 100 mg by mouth.    CALCIUM PO Take 1,000 mg by mouth.    Cholecalciferol (VITAMIN D3) 50 MCG (2000 UT) capsule Take 2,000 Units by mouth daily.   co-enzyme Q-10 30 MG capsule Take 30 mg by mouth daily.    DiphenhydrAMINE HCl (BENADRYL ALLERGY PO) Take 25 mg by mouth as needed.   hydrocortisone 2.5 % cream Apply to face twice daily for 2 wks, then daily for 2 wks, then daily as needed for itching   latanoprost (XALATAN) 0.005 % ophthalmic solution Place 1 drop into the left eye at bedtime.   Multiple Vitamin (MULTIVITAMIN) tablet Take 1 tablet by mouth daily.   pravastatin (PRAVACHOL) 40 MG tablet Take 1 tablet by mouth once daily   RESTASIS 0.05 % ophthalmic emulsion 1 drop 2 (two) times daily.   SELENIUM PO Take by mouth. 200 mg   SPIRULINA PO Take by  mouth. As needed   telmisartan (MICARDIS) 20 MG tablet Take 1 tablet by mouth once daily   timolol (TIMOPTIC) 0.5 % ophthalmic solution    vitamin C (ASCORBIC ACID) 500 MG tablet Take 500 mg by mouth daily.   No facility-administered encounter medications on file as of 05/23/2022.    Allergies (verified) Almond oil, Bean pod extract, Corn-containing products, Peanut-containing drug products, Peanuts [peanut oil], Sodium laureth sulfate, Soy allergy, Strawberry extract, and Tomato   History: Past Medical History:  Diagnosis Date   Allergy    Diabetes mellitus    GERD (gastroesophageal reflux disease)    Glaucoma    right eye   Hyperlipidemia    Leukopenia    Past Surgical History:  Procedure Laterality Date   APPENDECTOMY     COLONOSCOPY     TONSILLECTOMY     TUBAL LIGATION     VAGINAL HYSTERECTOMY     Family History  Problem Relation Age of Onset   Prostate cancer Father    Diabetes Mother    Prostate cancer Brother    Kidney disease Sister    Liver disease Brother    Colon cancer Brother    Esophageal cancer Neg Hx    Rectal cancer Neg Hx    Stomach cancer Neg Hx    Social History  Socioeconomic History   Marital status: Married    Spouse name: Not on file   Number of children: Not on file   Years of education: Not on file   Highest education level: Not on file  Occupational History   Occupation: retired  Tobacco Use   Smoking status: Never   Smokeless tobacco: Never  Vaping Use   Vaping Use: Never used  Substance and Sexual Activity   Alcohol use: Not Currently    Comment: wine on occasion   Drug use: No   Sexual activity: Not Currently  Other Topics Concern   Not on file  Social History Narrative   Not on file   Social Determinants of Health   Financial Resource Strain: Low Risk  (05/23/2022)   Overall Financial Resource Strain (CARDIA)    Difficulty of Paying Living Expenses: Not hard at all  Food Insecurity: No Food Insecurity (05/23/2022)    Hunger Vital Sign    Worried About Running Out of Food in the Last Year: Never true    [place] in the Last Year: Never true  Transportation Needs: No Transportation Needs (05/23/2022)   PRAPARE - Hydrologist (Medical): No    Lack of Transportation (Non-Medical): No  Physical Activity: Inactive (05/23/2022)   Exercise Vital Sign    Days of Exercise per Week: 0 days    Minutes of Exercise per Session: 0 min  Stress: No Stress Concern Present (05/23/2022)   [place]    Feeling of Stress : Only a little  Social Connections: Not on file    Tobacco Counseling Counseling given: Not Answered   Clinical Intake:  Pre-visit preparation completed: Yes  Pain : No/denies pain     Nutritional Status: BMI 25 -29 Overweight Nutritional Risks: None Diabetes: Yes  How often do you need to have someone help you when you read instructions, pamphlets, or other written materials from your doctor or pharmacy?: 1 - Never What is the last grade level you completed in school?: masters degree  Diabetic? Yes Nutrition Risk Assessment:  Has the patient had any N/V/D within the last 2 months?  No  Does the patient have any non-healing wounds?  No  Has the patient had any unintentional weight loss or weight gain?  No   Diabetes:  Is the patient diabetic?  Yes  If diabetic, was a CBG obtained today?  No  Did the patient bring in their glucometer from home?  No  How often do you monitor your CBG's? Every so often.   Financial Strains and Diabetes Management:  Are you having any financial strains with the device, your supplies or your medication? No .  Does the patient want to be seen by Chronic Care Management for management of their diabetes?  No  Would the patient like to be referred to a Nutritionist or for Diabetic Management?  No   Diabetic Exams:  Diabetic Eye Exam: Completed  2023 Diabetic Foot Exam: Completed 05/31/2021   Interpreter Needed?: No  Information entered by :: NAllen LPN   Activities of Daily Living    05/23/2022    8:40 AM  In your present state of health, do you have any difficulty performing the following activities:  Hearing? 0  Vision? 0  Difficulty concentrating or making decisions? 0  Walking or climbing stairs? 0  Dressing or bathing? 0  Doing errands, shopping? 0  Preparing Food and eating ?  N  Using the Toilet? N  In the past six months, have you accidently leaked urine? N  Do you have problems with loss of bowel control? N  Managing your Medications? N  Managing your Finances? N  Housekeeping or managing your Housekeeping? N    Patient Care Team: Glendale Chard, MD as PCP - General (Internal Medicine) Skeet Latch, MD as PCP - Cardiology (Cardiology)  Indicate any recent Medical Services you may have received from other than [facility] providers in the past year (date may be approximate).     Assessment:   This is a routine wellness examination for [place].  Hearing/Vision screen Vision Screening - Comments:: Regular eye exams, Dr. Jerline Pain  Dietary issues and exercise activities discussed: Current Exercise Habits: The patient does not participate in regular exercise at present   Goals Addressed             This Visit's Progress    Patient Stated       05/23/2022, wants to exercise and get rest       Depression Screen    05/23/2022    8:39 AM 04/26/2021    8:52 AM 04/20/2020    8:55 AM 04/15/2019    8:50 AM 11/12/2018    9:37 AM 07/15/2018    9:00 AM 08/03/2015   10:12 AM  PHQ 2/9 Scores  PHQ - 2 Score 0 0 0 0 0 0 0  PHQ- 9 Score    0       Fall Risk    05/23/2022    8:39 AM 04/26/2021    8:52 AM 04/20/2020    8:55 AM 08/20/2019    8:46 AM 04/15/2019    8:50 AM  Fall Risk   Falls in the past year? 0 1 0 0 0  Comment  tripped     Number falls in past yr: 0 0     Injury with Fall? 0 0     Risk for  fall due to : Medication side effect Medication side effect No Fall Risks    Follow up Falls prevention discussed;Education provided;Falls evaluation completed Falls evaluation completed;Education provided;Falls prevention discussed Falls evaluation completed;Education provided;Falls prevention discussed  Falls evaluation completed;Education provided;Falls prevention discussed    FALL RISK PREVENTION PERTAINING TO THE HOME:  Any stairs in or around the home? Yes  If so, are there any without handrails? No  Home free of loose throw rugs in walkways, pet beds, electrical cords, etc? Yes  Adequate lighting in your home to reduce risk of falls? Yes   ASSISTIVE DEVICES UTILIZED TO PREVENT FALLS:  Life alert? No  Use of a cane, walker or w/c? No  Grab bars in the bathroom? Yes  Shower chair or bench in shower? No  Elevated toilet seat or a handicapped toilet? Yes   TIMED UP AND GO:  Was the test performed? Yes .  Length of time to ambulate 10 feet: 5 sec.   Gait steady and fast without use of assistive device  Cognitive Function:        05/23/2022    8:40 AM 04/26/2021    8:54 AM 04/20/2020    8:57 AM 04/15/2019    8:53 AM  6CIT Screen  What Year? 0 points 0 points 0 points 0 points  What month? 0 points 0 points 0 points 0 points  What time? 0 points 0 points 0 points 0 points  Count back from 20 0 points 0 points 2 points 0 points  Months in reverse 0 points 0 points 0 points 0 points  Repeat phrase 0 points 2 points 2 points 0 points  Total Score 0 points 2 points 4 points 0 points    Immunizations Immunization History  Administered Date(s) Administered   DTaP 10/27/2012   Fluad Quad(high Dose 65+) 04/20/2020, 04/26/2021, 05/23/2022   Influenza, High Dose Seasonal PF 05/05/2018, 04/15/2019   Influenza-Unspecified 05/15/2018   PFIZER(Purple Top)SARS-COV-2 Vaccination 10/17/2019, 11/10/2019, 07/12/2020, 12/30/2020   Pneumococcal Conjugate-13 07/11/2016   Pneumococcal  Polysaccharide-23 10/27/2010, 01/18/2022   Pneumococcal-Unspecified 07/21/2016   Zoster Recombinat (Shingrix) 06/21/2021, 10/09/2021, 12/25/2021    TDAP status: Up to date  Flu Vaccine status: Completed at today's visit  Pneumococcal vaccine status: Up to date  Covid-19 vaccine status: Completed vaccines  Qualifies for Shingles Vaccine? Yes   Zostavax completed Yes   Shingrix Completed?: Yes  Screening Tests Health Maintenance  Topic Date Due   COVID-19 Vaccine (5 - Pfizer risk series) 02/24/2021   OPHTHALMOLOGY EXAM  03/21/2022   Diabetic kidney evaluation - Urine ACR  05/31/2022   FOOT EXAM  05/31/2022   HEMOGLOBIN A1C  07/20/2022   TETANUS/TDAP  10/28/2022   Diabetic kidney evaluation - GFR measurement  01/19/2023   Pneumonia Vaccine 35+ Years old  Completed   INFLUENZA VACCINE  Completed   DEXA SCAN  Completed   Zoster Vaccines- Shingrix  Completed   HPV VACCINES  Aged Out    Health Maintenance  Health Maintenance Due  Topic Date Due   COVID-19 Vaccine (5 - Pfizer risk series) 02/24/2021   OPHTHALMOLOGY EXAM  03/21/2022   Diabetic kidney evaluation - Urine ACR  05/31/2022    Colorectal cancer screening: No longer required.   Mammogram status: No longer required due to age.  Bone Density status: Completed 06/23/2018.   Lung Cancer Screening: (Low Dose CT Chest recommended if Age 73-80 years, 30 pack-year currently smoking OR have quit w/in 15years.) does not qualify.   Lung Cancer Screening Referral: no  Additional Screening:  Hepatitis C Screening: does not qualify;   Vision Screening: Recommended annual ophthalmology exams for early detection of glaucoma and other disorders of the eye. Is the patient up to date with their annual eye exam?  Yes  Who is the provider or what is the name of the office in which the patient attends annual eye exams? Dr. Jerline Pain If pt is not established with a provider, would they like to be referred to a provider to establish  care? No .   Dental Screening: Recommended annual dental exams for proper oral hygiene  Community Resource Referral / Chronic Care Management: CRR required this visit?  No   CCM required this visit?  No      Plan:     I have personally reviewed and noted the following in the patient's chart:   Medical and social history Use of alcohol, tobacco or illicit drugs  Current medications and supplements including opioid prescriptions. Patient is not currently taking opioid prescriptions. Functional ability and status Nutritional status Physical activity Advanced directives List of other physicians Hospitalizations, surgeries, and ER visits in previous 12 months Vitals Screenings to include cognitive, depression, and falls Referrals and appointments  In addition, I have reviewed and discussed with patient certain preventive protocols, quality metrics, and best practice recommendations. A written personalized care plan for preventive services as well as general preventive health recommendations were provided to patient.     Kellie Simmering, LPN   [id number]   Nurse Notes:  none

## 2022-06-04 ENCOUNTER — Other Ambulatory Visit: Payer: Self-pay | Admitting: Internal Medicine

## 2022-06-12 ENCOUNTER — Encounter: Payer: Self-pay | Admitting: Internal Medicine

## 2022-06-13 ENCOUNTER — Encounter: Payer: Self-pay | Admitting: Internal Medicine

## 2022-06-13 ENCOUNTER — Ambulatory Visit (INDEPENDENT_AMBULATORY_CARE_PROVIDER_SITE_OTHER): Payer: Medicare Other | Admitting: Internal Medicine

## 2022-06-13 VITALS — BP 120/74 | HR 47 | Temp 98.1°F | Ht 59.8 in | Wt 129.2 lb

## 2022-06-13 DIAGNOSIS — E7800 Pure hypercholesterolemia, unspecified: Secondary | ICD-10-CM

## 2022-06-13 DIAGNOSIS — E2839 Other primary ovarian failure: Secondary | ICD-10-CM

## 2022-06-13 DIAGNOSIS — E1122 Type 2 diabetes mellitus with diabetic chronic kidney disease: Secondary | ICD-10-CM

## 2022-06-13 DIAGNOSIS — Z0000 Encounter for general adult medical examination without abnormal findings: Secondary | ICD-10-CM

## 2022-06-13 DIAGNOSIS — R001 Bradycardia, unspecified: Secondary | ICD-10-CM

## 2022-06-13 DIAGNOSIS — N182 Chronic kidney disease, stage 2 (mild): Secondary | ICD-10-CM

## 2022-06-13 DIAGNOSIS — Z6825 Body mass index (BMI) 25.0-25.9, adult: Secondary | ICD-10-CM

## 2022-06-13 LAB — POCT URINALYSIS DIPSTICK
Bilirubin, UA: NEGATIVE
Blood, UA: NEGATIVE
Glucose, UA: NEGATIVE
Ketones, UA: NEGATIVE
Leukocytes, UA: NEGATIVE
Nitrite, UA: NEGATIVE
Protein, UA: NEGATIVE
Spec Grav, UA: 1.02 (ref 1.010–1.025)
Urobilinogen, UA: 0.2 E.U./dL
pH, UA: 6.5 (ref 5.0–8.0)

## 2022-06-13 MED ORDER — TELMISARTAN 20 MG PO TABS: 20.0000 mg | ORAL_TABLET | Freq: Every day | ORAL | 2 refills | Status: DC

## 2022-06-13 MED ORDER — PRAVASTATIN SODIUM 40 MG PO TABS: 40.0000 mg | ORAL_TABLET | Freq: Every day | ORAL | 3 refills | Status: DC

## 2022-06-13 NOTE — Progress Notes (Signed)
Rich Brave [place]ittleton,acting as a Education administrator for [name], MD.,have documented all relevant documentation on the behalf of [name], MD,as directed by  [name], MD while in the presence of [name], MD.   Subjective:     Patient ID: Ashley Lyons , female    DOB: 1941/03/03 , 81 y.o.   MRN: [id number]   Chief Complaint  Patient presents with   Annual Exam   Diabetes    HPI  She is here today for full physical examination. She is no longer followed by GYN. She reports compliance with meds. She denies headaches, chest pain and shortness of breath.   Diabetes She presents for her follow-up diabetic visit. She has type 2 diabetes mellitus. There are no hypoglycemic associated symptoms. Pertinent negatives for diabetes include no blurred vision and no chest pain. There are no hypoglycemic complications. Risk factors for coronary artery disease include diabetes mellitus, dyslipidemia, sedentary lifestyle and post-menopausal. Current diabetic treatment includes diet. She is compliant with treatment most of the time. She is following a diabetic diet. She participates in exercise intermittently. An ACE inhibitor/angiotensin II receptor blocker is not being taken. Eye exam is current.     Past Medical History:  Diagnosis Date   Allergy    Diabetes mellitus    GERD (gastroesophageal reflux disease)    Glaucoma    right eye   Hyperlipidemia    Leukopenia      Family History  Problem Relation Age of Onset   Prostate cancer Father    Diabetes Mother    Prostate cancer Brother    Kidney disease Sister    Liver disease Brother    Colon cancer Brother    Esophageal cancer Neg Hx    Rectal cancer Neg Hx    Stomach cancer Neg Hx      Current Outpatient Medications:    Barberry-Oreg Grape-Goldenseal (BERBERINE COMPLEX PO), Take 500 mg by mouth., Disp: , Rfl:    BIOTIN PO, Take 100 mg by mouth. , Disp: , Rfl:    CALCIUM PO, Take 1,000 mg by mouth. , Disp: , Rfl:     Cholecalciferol (VITAMIN D3) 50 MCG (2000 UT) capsule, Take 2,000 Units by mouth daily., Disp: , Rfl:    co-enzyme Q-10 30 MG capsule, Take 30 mg by mouth daily. , Disp: , Rfl:    DiphenhydrAMINE HCl (BENADRYL ALLERGY PO), Take 25 mg by mouth as needed., Disp: , Rfl:    hydrocortisone 2.5 % cream, Apply to face twice daily for 2 wks, then daily for 2 wks, then daily as needed for itching, Disp: , Rfl:    latanoprost (XALATAN) 0.005 % ophthalmic solution, Place 1 drop into the left eye at bedtime., Disp: , Rfl:    Multiple Vitamin (MULTIVITAMIN) tablet, Take 1 tablet by mouth daily., Disp: , Rfl:    RESTASIS 0.05 % ophthalmic emulsion, 1 drop 2 (two) times daily., Disp: , Rfl:    SELENIUM PO, Take by mouth. 200 mg, Disp: , Rfl:    SPIRULINA PO, Take by mouth. As needed, Disp: , Rfl:    timolol (TIMOPTIC) 0.5 % ophthalmic solution, , Disp: , Rfl:    vitamin C (ASCORBIC ACID) 500 MG tablet, Take 500 mg by mouth daily., Disp: , Rfl:    pravastatin (PRAVACHOL) 40 MG tablet, Take 1 tablet (40 mg total) by mouth daily., Disp: 90 tablet, Rfl: 3   telmisartan (MICARDIS) 20 MG tablet, Take 1 tablet (20 mg total)  by mouth daily., Disp: 90 tablet, Rfl: 2   Allergies  Allergen Reactions   Almond Oil Hives    Almonds   Bean Pod Extract Hives    String Beans   Corn-Containing Products Hives   Peanut-Containing Drug Products Hives   Peanuts [Peanut Oil] Hives   Sodium Laureth Sulfate Hives   Soy Allergy Hives   Strawberry Extract Hives   Tomato Hives      The patient states she uses post menopausal status for birth control. Last LMP was No LMP recorded. Patient has had a hysterectomy.. Negative for Dysmenorrhea. Negative for: breast discharge, breast lump(s), breast pain and breast self exam. Associated symptoms include abnormal vaginal bleeding. Pertinent negatives include abnormal bleeding (hematology), anxiety, decreased libido, depression, difficulty falling sleep, dyspareunia, history of  infertility, nocturia, sexual dysfunction, sleep disturbances, urinary incontinence, urinary urgency, vaginal discharge and vaginal itching. Diet regular.The patient states her exercise level is  intermittent.  . The patient's tobacco use is:  Social History   Tobacco Use  Smoking Status Never  Smokeless Tobacco Never  . She has been exposed to passive smoke. The patient's alcohol use is:  Social History   Substance and Sexual Activity  Alcohol Use Not Currently   Comment: wine on occasion    Review of Systems  Constitutional: Negative.   HENT: Negative.    Eyes: Negative.  Negative for blurred vision.  Respiratory: Negative.    Cardiovascular: Negative.  Negative for chest pain.  Gastrointestinal: Negative.   Endocrine: Negative.   Genitourinary: Negative.   Musculoskeletal: Negative.   Skin: Negative.   Allergic/Immunologic: Negative.   Neurological: Negative.   Hematological: Negative.   Psychiatric/Behavioral: Negative.       Today's Vitals   06/13/22 1013  BP: 120/74  Pulse: (!) 47  Temp: 98.1 F (36.7 C)  Weight: 129 lb 3.2 oz (58.6 kg)  Height: 4' 11.8" (1.519 m)  PainSc: 0-No pain   Body mass index is 25.4 kg/m.  Wt Readings from Last 3 Encounters:  06/13/22 129 lb 3.2 oz (58.6 kg)  05/23/22 129 lb 6.4 oz (58.7 kg)  01/18/22 127 lb 12.8 oz (58 kg)     Objective:  Physical Exam Vitals and nursing note reviewed.  Constitutional:      Appearance: Normal appearance.  HENT:     Head: Normocephalic and atraumatic.     Right Ear: Tympanic membrane, ear canal and external ear normal.     Left Ear: Tympanic membrane, ear canal and external ear normal.     Nose:     Comments: Masked     Mouth/Throat:     Comments: Masked  Eyes:     Extraocular Movements: Extraocular movements intact.     Conjunctiva/sclera: Conjunctivae normal.     Pupils: Pupils are equal, round, and reactive to light.  Cardiovascular:     Rate and Rhythm: Normal rate and regular  rhythm.     Pulses: Normal pulses.          Dorsalis pedis pulses are 2+ on the right side and 2+ on the left side.     Heart sounds: Normal heart sounds.  Pulmonary:     Effort: Pulmonary effort is normal.     Breath sounds: Normal breath sounds.  Chest:  Breasts:    Tanner Score is 5.     Right: Normal.     Left: Normal.  Abdominal:     General: Bowel sounds are normal.     Palpations: Abdomen is  soft.  Genitourinary:    Comments: deferred Musculoskeletal:        General: Normal range of motion.     Cervical back: Normal range of motion and neck supple.  Feet:     Right foot:     Protective Sensation: 5 sites tested.  5 sites sensed.     Skin integrity: Skin integrity normal.     Toenail Condition: Right toenails are normal.     Left foot:     Protective Sensation: 5 sites tested.  5 sites sensed.     Skin integrity: Skin integrity normal.     Toenail Condition: Left toenails are normal.  Skin:    General: Skin is warm and dry.  Neurological:     General: No focal deficit present.     Mental Status: She is alert and oriented to person, place, and time.     Coordination: Coordination normal.  Psychiatric:        Mood and Affect: Mood normal.        Behavior: Behavior normal.     Assessment And Plan:     1. Routine general medical examination at health care facility Comments: A full exam was performed. Importance of monthly self breast exams was discussed with the patient.  PATIENT IS ADVISED TO GET 30-45 MINUTES REGULAR EXERCISE NO LESS THAN FOUR TO FIVE DAYS PER WEEK - BOTH WEIGHTBEARING EXERCISES AND AEROBIC ARE RECOMMENDED.  PATIENT IS ADVISED TO FOLLOW A HEALTHY DIET WITH AT LEAST SIX FRUITS/VEGGIES PER DAY, DECREASE INTAKE OF RED MEAT, AND TO INCREASE FISH INTAKE TO TWO DAYS PER WEEK.  MEATS/FISH SHOULD NOT BE FRIED, BAKED OR BROILED IS PREFERABLE.  IT IS ALSO IMPORTANT TO CUT BACK ON YOUR SUGAR INTAKE. PLEASE AVOID ANYTHING WITH ADDED SUGAR, CORN SYRUP OR OTHER  SWEETENERS. IF YOU MUST USE A SWEETENER, YOU CAN TRY STEVIA. IT IS ALSO IMPORTANT TO AVOID ARTIFICIALLY SWEETENERS AND DIET BEVERAGES. LASTLY, I SUGGEST WEARING SPF 50 SUNSCREEN ON EXPOSED PARTS AND ESPECIALLY WHEN IN THE DIRECT SUNLIGHT FOR AN EXTENDED PERIOD OF TIME.  PLEASE AVOID FAST FOOD RESTAURANTS AND INCREASE YOUR WATER INTAKE.  2. Type 2 diabetes mellitus with stage 2 chronic kidney disease, without long-term current use of insulin (HCC) Comments: Chronic, diabetic foot exam was performed. She has been able to control her DM with lifestyle changes. She will f/u in 4 months for re-evaluation.  IT IS IMPORTANT TO MAINTAIN OPTIMAL BLOOD SUGAR CONTROL TO REDUCE YOUR RISK OF SERIOUS DIABETES COMPLICATIONS INCLUDING EYE AND KIDNEY DISEASE.  BLOOD SUGARS SHOULD BE LESS THAN 110 BEFORE MEALS AND LESS THAN 140 TWO HOURS AFTER MEALS.  PLEASE TEST BS THREE TIMES DAILY. BRING YOUR BLOOD SUGAR READINGS FOR REVIEW.  PLEASE MAINTAIN A HEALTHY, WELL BALANCED DIET FREE OF PROCESSED FOODS AND REFINED SUGARS.  MAINTAIN YOUR REGULAR EXERCISE REGIMEN.  PLS NOTIFY OFFICE FOR UNCONTROLLED NAUSEA, VOMITING, DIARRHEA, POOR ORAL INTAKE AND FOR CONSISTENT BS READINGS ABOVE 350.  - POCT Urinalysis Dipstick (81002) - Microalbumin / Creatinine Urine Ratio - EKG 12-Lead - CBC - CMP14+EGFR - Lipid panel - Hemoglobin A1c  3. Pure hypercholesterolemia Comments: Chronic, currently on pravastatin. Unable to tolerate high-intensity statins. LDL goal <70. - TSH  4. Sinus bradycardia Comments: EKG results reviewed, SB w / HR 45. She is currently asymptomatic. - TSH  5. Estrogen deficiency Comments: She agrees to bone density exam. Encouraged to engage in weight-bearing exercises at least three days per week. - DG Bone Density; Future  6. BMI  25.0-25.9,adult Comments: She is encouraged to aim for at least 150 minutes of exercise per week.  Patient was given opportunity to ask questions. Patient verbalized understanding  of the plan and was able to repeat key elements of the plan. All questions were answered to their satisfaction.   I, [name], MD, have reviewed all documentation for this visit. The documentation on 06/13/22 for the exam, diagnosis, procedures, and orders are all accurate and complete.   THE PATIENT IS ENCOURAGED TO PRACTICE SOCIAL DISTANCING DUE TO THE COVID-19 PANDEMIC.

## 2022-06-13 NOTE — Patient Instructions (Signed)

## 2022-06-15 LAB — CMP14+EGFR
ALT: 10 IU/L (ref 0–32)
AST: 19 IU/L (ref 0–40)
Albumin/Globulin Ratio: 1.4 (ref 1.2–2.2)
Albumin: 4.2 g/dL (ref 3.7–4.7)
Alkaline Phosphatase: 57 IU/L (ref 44–121)
BUN/Creatinine Ratio: 18 (ref 12–28)
BUN: 13 mg/dL (ref 8–27)
Bilirubin Total: 0.5 mg/dL (ref 0.0–1.2)
CO2: 23 mmol/L (ref 20–29)
Calcium: 9.2 mg/dL (ref 8.7–10.3)
Chloride: 105 mmol/L (ref 96–106)
Creatinine, Ser: 0.73 mg/dL (ref 0.57–1.00)
Globulin, Total: 3.1 g/dL (ref 1.5–4.5)
Glucose: 114 mg/dL — ABNORMAL HIGH (ref 70–99)
Potassium: 4.5 mmol/L (ref 3.5–5.2)
Sodium: 142 mmol/L (ref 134–144)
Total Protein: 7.3 g/dL (ref 6.0–8.5)
eGFR: 82 mL/min/{1.73_m2} (ref >59)

## 2022-06-15 LAB — TSH: TSH: 1.97 u[IU]/mL (ref 0.450–4.500)

## 2022-06-15 LAB — LIPID PANEL
Chol/HDL Ratio: 2.8 ratio (ref 0.0–4.4)
Cholesterol, Total: 203 mg/dL — ABNORMAL HIGH (ref 100–199)
HDL: 72 mg/dL (ref >39)
LDL Chol Calc (NIH): 119 mg/dL — ABNORMAL HIGH (ref 0–99)
Triglycerides: 67 mg/dL (ref 0–149)
VLDL Cholesterol Cal: 12 mg/dL (ref 5–40)

## 2022-06-15 LAB — HEMOGLOBIN A1C
Est. average glucose Bld gHb Est-mCnc: 143 mg/dL
Hgb A1c MFr Bld: 6.6 % — ABNORMAL HIGH (ref 4.8–5.6)

## 2022-06-15 LAB — CBC
Hematocrit: 38.3 % (ref 34.0–46.6)
Hemoglobin: 13.2 g/dL (ref 11.1–15.9)
MCH: 35.3 pg — ABNORMAL HIGH (ref 26.6–33.0)
MCHC: 34.5 g/dL (ref 31.5–35.7)
MCV: 102 fL — ABNORMAL HIGH (ref 79–97)
Platelets: 242 10*3/uL (ref 150–450)
RBC: 3.74 x10E6/uL — ABNORMAL LOW (ref 3.77–5.28)
RDW: 12.6 % (ref 11.7–15.4)
WBC: 2.5 10*3/uL — CL (ref 3.4–10.8)

## 2022-06-15 LAB — MICROALBUMIN / CREATININE URINE RATIO
Creatinine, Urine: 102.3 mg/dL
Microalb/Creat Ratio: 4 mg/g creat (ref 0–29)
Microalbumin, Urine: 4 ug/mL

## 2022-06-19 ENCOUNTER — Other Ambulatory Visit: Payer: Self-pay

## 2022-06-19 MED ORDER — PRAVASTATIN SODIUM 20 MG PO TABS: 20.0000 mg | ORAL_TABLET | Freq: Every day | ORAL | 2 refills | Status: DC

## 2022-07-03 ENCOUNTER — Encounter: Payer: Self-pay | Admitting: Internal Medicine

## 2022-07-31 ENCOUNTER — Encounter (HOSPITAL_BASED_OUTPATIENT_CLINIC_OR_DEPARTMENT_OTHER): Payer: Self-pay | Admitting: Cardiovascular Disease

## 2022-07-31 ENCOUNTER — Ambulatory Visit (HOSPITAL_BASED_OUTPATIENT_CLINIC_OR_DEPARTMENT_OTHER): Payer: Medicare Other | Admitting: Cardiovascular Disease

## 2022-07-31 VITALS — BP 132/70 | HR 56 | Ht 62.5 in | Wt 130.0 lb

## 2022-07-31 DIAGNOSIS — I10 Essential (primary) hypertension: Secondary | ICD-10-CM | POA: Diagnosis not present

## 2022-07-31 DIAGNOSIS — N182 Chronic kidney disease, stage 2 (mild): Secondary | ICD-10-CM

## 2022-07-31 DIAGNOSIS — E1122 Type 2 diabetes mellitus with diabetic chronic kidney disease: Secondary | ICD-10-CM

## 2022-07-31 DIAGNOSIS — E7800 Pure hypercholesterolemia, unspecified: Secondary | ICD-10-CM | POA: Diagnosis not present

## 2022-07-31 DIAGNOSIS — R001 Bradycardia, unspecified: Secondary | ICD-10-CM

## 2022-07-31 HISTORY — DX: Essential (primary) hypertension: I10

## 2022-07-31 NOTE — Assessment & Plan Note (Signed)
She continues to be bradycardic but is asymptomatic.  Avoid nodal agents.

## 2022-07-31 NOTE — Assessment & Plan Note (Signed)
Lipids are above goal in November.  Dr. Baird Cancer appropriately increased her statin.  She will have repeat labs in a few months.  She was encouraged to keep exercising.  We referred her to the exercise program here at [facility] and the nutrition classes as well.

## 2022-07-31 NOTE — Progress Notes (Signed)
Cardiology Office Note   Date:  07/31/2022   ID:  VERDIA Lyons, DOB [birth year], MRN [id number]  PCP:  Glendale Chard, MD  Cardiologist:   Skeet Latch, MD   No chief complaint on file.    History of Present Illness: Ashley Lyons is a 81 y.o. female with diabetes,hypertension,  hyperlipidemia, and GERD here for follow-up.  She was initially seen 06/2019 for evaluation of bradycardia.  She saw Dr. Baird Cancer on 04/2019 and was noted to be bradycardic.  Her heart rate was 43 bpm at that appointment.  EKG at that time revealed sinus bradycardia at 43 bpm and LAFB.  She has a history of bradycardia but this was the lowest recorded value.  At her cardiology appointment she was feeling well and asymptomatic.  No changes were made and she was not referred for pacemaker.  We did recommend increasing her pravastatin to daily, which she has tolerated well.  She was referred to the PREP program.   Today, . She did a 12 week program but she has fell off following the program. When she was exercising she had no cardiac symptoms and said she felt better physically. She has been very stressed due to her husband being admitted to the hospital last week. He has difficulty breathing and is not compliant with his doctors recommendations. She has not been taking the blood pressure recently though her granddaughter checked it a couple of days ago and it was elevated. Occasionally she does have some cramping in her shins. She has been moving a lot more prior to her husbands hospital visit that it may be due to that. She admits that she does not make sure to stay hydrated as well as she should. Her most recent cholesterol was elevated and her PCP increased her dose of pravastatin to 20 mg daily.   She denies any palpitations, chest pain, shortness of breath, or peripheral edema. No lightheadedness, headaches, syncope, orthopnea, or PND.     Past Medical History:  Diagnosis Date   Allergy    Diabetes  mellitus    GERD (gastroesophageal reflux disease)    Glaucoma    right eye   Hyperlipidemia    Leukopenia    Primary hypertension 07/31/2022    Past Surgical History:  Procedure Laterality Date   APPENDECTOMY     COLONOSCOPY     TONSILLECTOMY     TUBAL LIGATION     VAGINAL HYSTERECTOMY       Current Outpatient Medications  Medication Sig Dispense Refill   Barberry-Oreg Grape-Goldenseal (BERBERINE COMPLEX PO) Take 500 mg by mouth.     BIOTIN PO Take 100 mg by mouth.      CALCIUM PO Take 1,000 mg by mouth.      Cholecalciferol (VITAMIN D3) 50 MCG (2000 UT) capsule Take 2,000 Units by mouth daily.     co-enzyme Q-10 30 MG capsule Take 30 mg by mouth daily.      DiphenhydrAMINE HCl (BENADRYL ALLERGY PO) Take 25 mg by mouth as needed.     hydrocortisone 2.5 % cream Apply to face twice daily for 2 wks, then daily for 2 wks, then daily as needed for itching     latanoprost (XALATAN) 0.005 % ophthalmic solution Place 1 drop into the left eye at bedtime.     Multiple Vitamin (MULTIVITAMIN) tablet Take 1 tablet by mouth daily.     pravastatin (PRAVACHOL) 20 MG tablet Take 1 tablet (20 mg total) by mouth daily. Badger  tablet 2   RESTASIS 0.05 % ophthalmic emulsion 1 drop 2 (two) times daily.     SELENIUM PO Take by mouth. 200 mg     SPIRULINA PO Take by mouth. As needed     telmisartan (MICARDIS) 20 MG tablet Take 1 tablet (20 mg total) by mouth daily. 90 tablet 2   timolol (TIMOPTIC) 0.5 % ophthalmic solution      vitamin C (ASCORBIC ACID) 500 MG tablet Take 500 mg by mouth daily.     No current facility-administered medications for this visit.    Allergies:   Almond oil, Bean pod extract, Corn-containing products, Peanut-containing drug products, Peanuts [peanut oil], Sodium laureth sulfate, Soy allergy, Strawberry extract, and Tomato    Social History:  The patient  reports that she has never smoked. She has never used smokeless tobacco. She reports that she does not currently use  alcohol. She reports that she does not use drugs.   Family History:  The patient's family history includes Colon cancer in her brother; Diabetes in her mother; Kidney disease in her sister; Liver disease in her brother; Prostate cancer in her brother and father.    ROS:  Please see the history of present illness.  (+) stress  (+) cramping bilateral LE (shins)   All other systems are reviewed and negative.    PHYSICAL EXAM: VS:  BP 132/70   Pulse (!) 56   Ht 5' 2.5" (1.588 m)   Wt 130 lb (59 kg)   SpO2 99%   BMI 23.40 kg/m  , BMI Body mass index is 23.4 kg/m. GENERAL:  Well appearing HEENT: Pupils equal round and reactive, fundi not visualized, oral mucosa unremarkable NECK:  No jugular venous distention, waveform within normal limits, carotid upstroke brisk and symmetric, no bruits LUNGS:  Clear to auscultation bilaterally HEART:  RRR.  PMI not displaced or sustained,S1 and S2 within normal limits, no S3, no S4, no clicks, no rubs, no murmurs ABD:  Flat, positive bowel sounds normal in frequency in pitch, no bruits, no rebound, no guarding, no midline pulsatile mass, no hepatomegaly, no splenomegaly EXT:  2 plus pulses throughout, no edema, no cyanosis no clubbing SKIN:  No rashes no nodules NEURO:  Cranial nerves II through XII grossly intact, motor grossly intact throughout PSYCH:  Cognitively intact, oriented to person place and time  EKG:  EKG is not ordered today. 07/31/2022: Sinus bradycardia. Rate 56 bpm.  04/15/2019: Sinus bradycardia.  Rate 43 bpm.  LAFB.   Recent Labs: 06/13/2022: ALT 10; BUN 13; Creatinine, Ser 0.73; Hemoglobin 13.2; Platelets 242; Potassium 4.5; Sodium 142; TSH 1.970    Lipid Panel    Component Value Date/Time   CHOL 203 (H) 06/13/2022 1055   TRIG 67 06/13/2022 1055   HDL 72 06/13/2022 1055   CHOLHDL 2.8 06/13/2022 1055   LDLCALC 119 (H) 06/13/2022 1055      Wt Readings from Last 3 Encounters:  07/31/22 130 lb (59 kg)  06/13/22 129 lb 3.2  oz (58.6 kg)  05/23/22 129 lb 6.4 oz (58.7 kg)      ASSESSMENT AND PLAN:  Pure hypercholesterolemia Lipids are above goal in November.  Dr. Baird Cancer appropriately increased her statin.  She will have repeat labs in a few months.  She was encouraged to keep exercising.  We referred her to the exercise program here at [facility] and the nutrition classes as well.  Primary hypertension Blood pressure slightly above goal today.  It has generally been well-controlled.  Continue telmisartan.  She is encouraged to increase exercise to at least 150 minutes weekly.  Refer to the exercise program downstairs as above.  Bradycardia She continues to be bradycardic but is asymptomatic.  Avoid nodal agents.  Current medicines are reviewed at length with the patient today.  The patient does not have concerns regarding medicines.  The following changes have been made:  Pravastatin '40mg'$  daily  Labs/ tests ordered today include:   Orders Placed This Encounter  Procedures   Ambulatory referral to [facility]   EKG 12-Lead      Disposition:   FU with Mahira Petras C. Oval Linsey, MD, [facility] in 1 year.   I,Jessica Ford,acting as a Education administrator for [place], MD.,have documented all relevant documentation on the behalf of Skeet Latch, MD,as directed by  Skeet Latch, MD while in the presence of Skeet Latch, MD.   I, [place] Oval Linsey, MD have reviewed all documentation for this visit.  The documentation of the exam, diagnosis, procedures, and orders on 07/31/2022 are all accurate and complete.     Signed, Sacheen Arrasmith C. Oval Linsey, MD, [facility]  07/31/2022 9:48 AM    [facility]

## 2022-07-31 NOTE — Patient Instructions (Addendum)
Medication Instructions:  Continue current medications  *If you need a refill on your cardiac medications before your next appointment, please call your pharmacy*   Lab Work: None Ordered   Testing/Procedures: None Ordered   Follow-Up: At [facility], you and your health needs are our priority.  As part of our continuing mission to provide you with exceptional heart care, we have created designated Provider Care Teams.  These Care Teams include your primary Cardiologist (physician) and Advanced Practice Providers (APPs -  Physician Assistants and Nurse Practitioners) who all work together to provide you with the care you need, when you need it.  We recommend signing up for the patient portal called "MyChart".  Sign up information is provided on this After Visit Summary.  MyChart is used to connect with patients for Virtual Visits (Telemedicine).  Patients are able to view lab/test results, encounter notes, upcoming appointments, etc.  Non-urgent messages can be sent to your provider as well.   To learn more about what you can do with MyChart, go to [email or web address].    Your next appointment:   1 year(s)  The format for your next appointment:   In Person  Provider:   Skeet Latch, MD    Other Instructions

## 2022-07-31 NOTE — Assessment & Plan Note (Signed)
Blood pressure slightly above goal today.  It has generally been well-controlled.  Continue telmisartan.  She is encouraged to increase exercise to at least 150 minutes weekly.  Refer to the exercise program downstairs as above.

## 2022-10-16 ENCOUNTER — Encounter: Payer: Self-pay | Admitting: Internal Medicine

## 2022-10-16 ENCOUNTER — Ambulatory Visit (INDEPENDENT_AMBULATORY_CARE_PROVIDER_SITE_OTHER): Payer: Medicare Other | Admitting: Internal Medicine

## 2022-10-16 VITALS — BP 136/80 | HR 50 | Temp 98.5°F | Ht 62.0 in | Wt 131.0 lb

## 2022-10-16 DIAGNOSIS — E1122 Type 2 diabetes mellitus with diabetic chronic kidney disease: Secondary | ICD-10-CM

## 2022-10-16 DIAGNOSIS — E7800 Pure hypercholesterolemia, unspecified: Secondary | ICD-10-CM

## 2022-10-16 DIAGNOSIS — N182 Chronic kidney disease, stage 2 (mild): Secondary | ICD-10-CM

## 2022-10-16 DIAGNOSIS — Z6823 Body mass index (BMI) 23.0-23.9, adult: Secondary | ICD-10-CM | POA: Diagnosis not present

## 2022-10-16 DIAGNOSIS — D709 Neutropenia, unspecified: Secondary | ICD-10-CM | POA: Diagnosis not present

## 2022-10-16 NOTE — Patient Instructions (Signed)

## 2022-10-16 NOTE — Progress Notes (Signed)
Ashley Lyons,acting as a Education administrator for Ashley Greenland, MD.,have documented all relevant documentation on the behalf of Ashley Greenland, MD,as directed by  Ashley Greenland, MD while in the presence of Ashley Greenland, MD.    Subjective:     Patient ID: Ashley Lyons , female    DOB: [birth year] , 82 y.o.   MRN: [id number]   Chief Complaint  Patient presents with   Diabetes   Hyperlipidemia    HPI  She presents today for DM check. This is currently controlled w/ lifestyle changes.  She reports compliance with meds for other conditions. She denies headaches, chest pain and shortness of breath.   BP Readings from Last 3 Encounters: 10/16/22 : 136/80 07/31/22 : 132/70 06/13/22 : 120/74    Diabetes She presents for her follow-up diabetic visit. She has type 2 diabetes mellitus. Her disease course has been stable. There are no hypoglycemic associated symptoms. Pertinent negatives for diabetes include no polydipsia, no polyphagia and no polyuria. There are no hypoglycemic complications. Risk factors for coronary artery disease include dyslipidemia, diabetes mellitus, sedentary lifestyle and post-menopausal. She is following a generally healthy diet. She participates in exercise intermittently. There is no change in her home blood glucose trend. Her breakfast blood glucose is taken between 7-8 am. Her breakfast blood glucose range is generally 90-110 mg/dl. An ACE inhibitor/angiotensin II receptor blocker is not being taken. Eye exam is current.  Hyperlipidemia This is a chronic problem. The problem is controlled. Exacerbating diseases include diabetes. Current antihyperlipidemic treatment includes statins. Risk factors for coronary artery disease include diabetes mellitus, dyslipidemia and post-menopausal.     Past Medical History:  Diagnosis Date   Allergy    Diabetes mellitus    GERD (gastroesophageal reflux disease)    Glaucoma    right eye   Hyperlipidemia    Leukopenia     Primary hypertension 07/31/2022     Family History  Problem Relation Age of Onset   Prostate cancer Father    Diabetes Mother    Prostate cancer Brother    Kidney disease Sister    Liver disease Brother    Colon cancer Brother    Esophageal cancer Neg Hx    Rectal cancer Neg Hx    Stomach cancer Neg Hx      Current Outpatient Medications:    Barberry-Oreg Grape-Goldenseal (BERBERINE COMPLEX PO), Take 500 mg by mouth., Disp: , Rfl:    BIOTIN PO, Take 100 mg by mouth. , Disp: , Rfl:    CALCIUM PO, Take 1,000 mg by mouth. , Disp: , Rfl:    Cholecalciferol (VITAMIN D3) 50 MCG (2000 UT) capsule, Take 2,000 Units by mouth daily., Disp: , Rfl:    co-enzyme Q-10 30 MG capsule, Take 30 mg by mouth daily. , Disp: , Rfl:    DiphenhydrAMINE HCl (BENADRYL ALLERGY PO), Take 25 mg by mouth as needed., Disp: , Rfl:    hydrocortisone 2.5 % cream, Apply to face twice daily for 2 wks, then daily for 2 wks, then daily as needed for itching, Disp: , Rfl:    latanoprost (XALATAN) 0.005 % ophthalmic solution, Place 1 drop into the left eye at bedtime., Disp: , Rfl:    Multiple Vitamin (MULTIVITAMIN) tablet, Take 1 tablet by mouth daily., Disp: , Rfl:    pravastatin (PRAVACHOL) 20 MG tablet, Take 1 tablet (20 mg total) by mouth daily., Disp: 90 tablet, Rfl: 2   RESTASIS 0.05 % ophthalmic emulsion, 1 drop 2 (  two) times daily., Disp: , Rfl:    SELENIUM PO, Take by mouth. 200 mg, Disp: , Rfl:    SPIRULINA PO, Take by mouth. As needed, Disp: , Rfl:    telmisartan (MICARDIS) 20 MG tablet, Take 1 tablet (20 mg total) by mouth daily., Disp: 90 tablet, Rfl: 2   timolol (TIMOPTIC) 0.5 % ophthalmic solution, , Disp: , Rfl:    vitamin C (ASCORBIC ACID) 500 MG tablet, Take 500 mg by mouth daily., Disp: , Rfl:    Allergies  Allergen Reactions   Almond Oil Hives    Almonds   Bean Pod Extract Hives    String Beans   Corn-Containing Products Hives   Peanut-Containing Drug Products Hives   Peanuts [Peanut Oil]  Hives   Sodium Laureth Sulfate Hives   Soy Allergy Hives   Strawberry Extract Hives   Tomato Hives     Review of Systems  Constitutional: Negative.   Respiratory: Negative.    Cardiovascular: Negative.   Gastrointestinal: Negative.   Endocrine: Negative for polydipsia, polyphagia and polyuria.  Skin: Negative.   Neurological: Negative.   Psychiatric/Behavioral: Negative.       Today's Vitals   10/16/22 1013  BP: 136/80  Pulse: (!) 50  Temp: 98.5 F (36.9 C)  TempSrc: Oral  Weight: 131 lb (59.4 kg)  Height: 5\' 2"  (1.575 m)  PainSc: 0-No pain   Body mass index is 23.96 kg/m.  Wt Readings from Last 3 Encounters:  10/16/22 131 lb (59.4 kg)  07/31/22 130 lb (59 kg)  06/13/22 129 lb 3.2 oz (58.6 kg)    Objective:  Physical Exam Vitals and nursing note reviewed.  Constitutional:      Appearance: Normal appearance.  HENT:     Head: Normocephalic and atraumatic.     Nose:     Comments: Masked     Mouth/Throat:     Comments: Masked  Eyes:     Extraocular Movements: Extraocular movements intact.  Cardiovascular:     Rate and Rhythm: Normal rate and regular rhythm.     Heart sounds: Normal heart sounds.  Pulmonary:     Effort: Pulmonary effort is normal.     Breath sounds: Normal breath sounds.  Musculoskeletal:     Cervical back: Normal range of motion.  Skin:    General: Skin is warm.  Neurological:     General: No focal deficit present.     Mental Status: She is alert.  Psychiatric:        Mood and Affect: Mood normal.        Behavior: Behavior normal.      Assessment And Plan:     1. Type 2 diabetes mellitus with stage 2 chronic kidney disease, without long-term current use of insulin (HCC) Comments: Chronic, she has controlled DM w/o meds.  She agrees to f/u in 4 months. She is encouraged to aim for at least 150 min of exercise/week. - Hemoglobin A1c - CMP14+EGFR - CBC with Diff  2. Pure hypercholesterolemia Comments: Chronic, LDL goal < 70.  She  will c/w pravastatin 20mg  daily.  She has not tolerated high intensity statins in the past.  3. Neutropenia, unspecified type ([place]) Comments: I will check repeat CBC today. 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 - CBC with Diff  4. Body mass index (BMI) of 23.0-23.9 in adult Comments: She is encouraged to aim for at least 150 minutes of exercise/week.   Patient was given opportunity to ask questions. Patient verbalized understanding of  the plan and was able to repeat key elements of the plan. All questions were answered to their satisfaction.   I, Ashley Greenland, MD, have reviewed all documentation for this visit. The documentation on 10/16/22 for the exam, diagnosis, procedures, and orders are all accurate and complete.   IF YOU HAVE BEEN REFERRED TO A SPECIALIST, IT MAY TAKE 1-2 WEEKS TO SCHEDULE/PROCESS THE REFERRAL. IF YOU HAVE NOT HEARD FROM US/SPECIALIST IN TWO WEEKS, PLEASE GIVE [place] A CALL AT [phone].   THE PATIENT IS ENCOURAGED TO PRACTICE SOCIAL DISTANCING DUE TO THE COVID-19 PANDEMIC.

## 2022-10-17 LAB — CBC WITH DIFFERENTIAL/PLATELET
Basophils Absolute: 0 10*3/uL (ref 0.0–0.2)
Basos: 1 %
EOS (ABSOLUTE): 0.1 10*3/uL (ref 0.0–0.4)
Eos: 3 %
Hematocrit: 38.8 % (ref 34.0–46.6)
Hemoglobin: 12.9 g/dL (ref 11.1–15.9)
Immature Grans (Abs): 0 10*3/uL (ref 0.0–0.1)
Immature Granulocytes: 0 %
Lymphocytes Absolute: 1.4 10*3/uL (ref 0.7–3.1)
Lymphs: 38 %
MCH: 33.9 pg — ABNORMAL HIGH (ref 26.6–33.0)
MCHC: 33.2 g/dL (ref 31.5–35.7)
MCV: 102 fL — ABNORMAL HIGH (ref 79–97)
Monocytes Absolute: 0.3 10*3/uL (ref 0.1–0.9)
Monocytes: 8 %
Neutrophils Absolute: 1.8 10*3/uL (ref 1.4–7.0)
Neutrophils: 50 %
Platelets: 265 10*3/uL (ref 150–450)
RBC: 3.8 x10E6/uL (ref 3.77–5.28)
RDW: 12.4 % (ref 11.7–15.4)
WBC: 3.6 10*3/uL (ref 3.4–10.8)

## 2022-10-17 LAB — CMP14+EGFR
ALT: 9 IU/L (ref 0–32)
AST: 19 IU/L (ref 0–40)
Albumin/Globulin Ratio: 1.4 (ref 1.2–2.2)
Albumin: 4.2 g/dL (ref 3.7–4.7)
Alkaline Phosphatase: 63 IU/L (ref 44–121)
BUN/Creatinine Ratio: 16 (ref 12–28)
BUN: 13 mg/dL (ref 8–27)
Bilirubin Total: 0.6 mg/dL (ref 0.0–1.2)
CO2: 24 mmol/L (ref 20–29)
Calcium: 9.3 mg/dL (ref 8.7–10.3)
Chloride: 104 mmol/L (ref 96–106)
Creatinine, Ser: 0.8 mg/dL (ref 0.57–1.00)
Globulin, Total: 2.9 g/dL (ref 1.5–4.5)
Glucose: 110 mg/dL — ABNORMAL HIGH (ref 70–99)
Potassium: 4.3 mmol/L (ref 3.5–5.2)
Sodium: 141 mmol/L (ref 134–144)
Total Protein: 7.1 g/dL (ref 6.0–8.5)
eGFR: 73 mL/min/{1.73_m2} (ref >59)

## 2022-10-17 LAB — HEMOGLOBIN A1C
Est. average glucose Bld gHb Est-mCnc: 137 mg/dL
Hgb A1c MFr Bld: 6.4 % — ABNORMAL HIGH (ref 4.8–5.6)

## 2022-11-23 DIAGNOSIS — H401122 Primary open-angle glaucoma, left eye, moderate stage: Secondary | ICD-10-CM | POA: Diagnosis not present

## 2022-11-23 DIAGNOSIS — H04123 Dry eye syndrome of bilateral lacrimal glands: Secondary | ICD-10-CM | POA: Diagnosis not present

## 2022-11-23 DIAGNOSIS — H401111 Primary open-angle glaucoma, right eye, mild stage: Secondary | ICD-10-CM | POA: Diagnosis not present

## 2022-11-23 DIAGNOSIS — E119 Type 2 diabetes mellitus without complications: Secondary | ICD-10-CM | POA: Diagnosis not present

## 2022-12-04 ENCOUNTER — Ambulatory Visit
Admission: RE | Admit: 2022-12-04 | Discharge: 2022-12-04 | Disposition: A | Discharge status: Home / Self Care | Payer: Medicare Other | Source: Ambulatory Visit | Attending: Internal Medicine | Admitting: Internal Medicine

## 2022-12-04 DIAGNOSIS — E2839 Other primary ovarian failure: Secondary | ICD-10-CM

## 2022-12-04 DIAGNOSIS — Z13820 Encounter for screening for osteoporosis: Secondary | ICD-10-CM | POA: Diagnosis not present

## 2022-12-04 DIAGNOSIS — M810 Age-related osteoporosis without current pathological fracture: Secondary | ICD-10-CM | POA: Diagnosis not present

## 2023-02-25 ENCOUNTER — Ambulatory Visit: Payer: Medicare Other | Admitting: Internal Medicine

## 2023-03-06 ENCOUNTER — Encounter: Payer: Self-pay | Admitting: Internal Medicine

## 2023-03-06 ENCOUNTER — Ambulatory Visit (INDEPENDENT_AMBULATORY_CARE_PROVIDER_SITE_OTHER): Payer: Medicare Other

## 2023-03-06 ENCOUNTER — Ambulatory Visit (INDEPENDENT_AMBULATORY_CARE_PROVIDER_SITE_OTHER): Payer: Medicare Other | Admitting: Internal Medicine

## 2023-03-06 VITALS — BP 110/74 | HR 43 | Temp 98.1°F | Ht 62.0 in | Wt 127.8 lb

## 2023-03-06 VITALS — BP 110/74 | HR 43 | Temp 98.1°F | Ht 62.0 in | Wt 127.0 lb

## 2023-03-06 DIAGNOSIS — Z0000 Encounter for general adult medical examination without abnormal findings: Secondary | ICD-10-CM

## 2023-03-06 DIAGNOSIS — R001 Bradycardia, unspecified: Secondary | ICD-10-CM

## 2023-03-06 DIAGNOSIS — E7800 Pure hypercholesterolemia, unspecified: Secondary | ICD-10-CM | POA: Diagnosis not present

## 2023-03-06 DIAGNOSIS — E1122 Type 2 diabetes mellitus with diabetic chronic kidney disease: Secondary | ICD-10-CM | POA: Diagnosis not present

## 2023-03-06 DIAGNOSIS — N182 Chronic kidney disease, stage 2 (mild): Secondary | ICD-10-CM

## 2023-03-06 DIAGNOSIS — F4321 Adjustment disorder with depressed mood: Secondary | ICD-10-CM

## 2023-03-06 LAB — CMP14+EGFR
ALT: 12 IU/L (ref 0–32)
AST: 20 IU/L (ref 0–40)
Albumin: 4 g/dL (ref 3.7–4.7)
Alkaline Phosphatase: 59 IU/L (ref 44–121)
BUN/Creatinine Ratio: 15 (ref 12–28)
BUN: 12 mg/dL (ref 8–27)
Bilirubin Total: 0.5 mg/dL (ref 0.0–1.2)
CO2: 23 mmol/L (ref 20–29)
Calcium: 9.3 mg/dL (ref 8.7–10.3)
Chloride: 104 mmol/L (ref 96–106)
Creatinine, Ser: 0.82 mg/dL (ref 0.57–1.00)
Globulin, Total: 3.3 g/dL (ref 1.5–4.5)
Glucose: 115 mg/dL — ABNORMAL HIGH (ref 70–99)
Potassium: 4.7 mmol/L (ref 3.5–5.2)
Sodium: 141 mmol/L (ref 134–144)
Total Protein: 7.3 g/dL (ref 6.0–8.5)
eGFR: 70 mL/min/{1.73_m2} (ref >59)

## 2023-03-06 LAB — LIPID PANEL
Chol/HDL Ratio: 2.9 ratio (ref 0.0–4.4)
Cholesterol, Total: 214 mg/dL — ABNORMAL HIGH (ref 100–199)
HDL: 75 mg/dL (ref >39)
LDL Chol Calc (NIH): 127 mg/dL — ABNORMAL HIGH (ref 0–99)
Triglycerides: 69 mg/dL (ref 0–149)
VLDL Cholesterol Cal: 12 mg/dL (ref 5–40)

## 2023-03-06 LAB — HEMOGLOBIN A1C
Est. average glucose Bld gHb Est-mCnc: 134 mg/dL
Hgb A1c MFr Bld: 6.3 % — ABNORMAL HIGH (ref 4.8–5.6)

## 2023-03-06 MED ORDER — PRAVASTATIN SODIUM 20 MG PO TABS: 20.0000 mg | ORAL_TABLET | Freq: Every day | ORAL | 2 refills | Status: DC

## 2023-03-06 MED ORDER — TELMISARTAN 20 MG PO TABS: 20.0000 mg | ORAL_TABLET | Freq: Every day | ORAL | 2 refills | Status: DC

## 2023-03-06 NOTE — Assessment & Plan Note (Addendum)
Chronic, she feels well. She has had Cardiology evaluation in the past. Thyroid function normal in Nov 2023.

## 2023-03-06 NOTE — Progress Notes (Signed)
I,Victoria T Deloria Lair, CMA,acting as a Neurosurgeon for Gwynneth Aliment, MD.,have documented all relevant documentation on the behalf of Gwynneth Aliment, MD,as directed by  Gwynneth Aliment, MD while in the presence of Gwynneth Aliment, MD.  Subjective:  Patient ID: Ashley Lyons , female    DOB: 11/23/1940 , 82 y.o.   MRN: [id number]  Chief Complaint  Patient presents with   Diabetes   Hyperlipidemia    HPI  She presents today for DM & cholesterol check. She reports compliance with medications. She denies headaches, chest pain and shortness of breath. She denies having any specific questions or concerns.    AWV completed with [facility] Advisor: [place]     Diabetes She presents for her follow-up diabetic visit. She has type 2 diabetes mellitus. Her disease course has been stable. There are no hypoglycemic associated symptoms. Pertinent negatives for diabetes include no polydipsia, no polyphagia and no polyuria. There are no hypoglycemic complications. Risk factors for coronary artery disease include dyslipidemia, diabetes mellitus, sedentary lifestyle and post-menopausal. She is following a generally healthy diet. She participates in exercise intermittently. There is no change in her home blood glucose trend. Her breakfast blood glucose is taken between 7-8 am. Her breakfast blood glucose range is generally 90-110 mg/dl. An ACE inhibitor/angiotensin II receptor blocker is not being taken. Eye exam is current.  Hyperlipidemia This is a chronic problem. The problem is controlled. Exacerbating diseases include diabetes. Current antihyperlipidemic treatment includes statins and diet change. The current treatment provides moderate improvement of lipids. Risk factors for coronary artery disease include diabetes mellitus, dyslipidemia and post-menopausal.     Past Medical History:  Diagnosis Date   Allergy    Diabetes mellitus    GERD (gastroesophageal reflux disease)    Glaucoma    right eye    Hyperlipidemia    Leukopenia    Primary hypertension 07/31/2022     Family History  Problem Relation Age of Onset   Prostate cancer Father    Diabetes Mother    Prostate cancer Brother    Kidney disease Sister    Liver disease Brother    Colon cancer Brother    Esophageal cancer Neg Hx    Rectal cancer Neg Hx    Stomach cancer Neg Hx      Current Outpatient Medications:    Barberry-Oreg Grape-Goldenseal (BERBERINE COMPLEX PO), Take 500 mg by mouth., Disp: , Rfl:    BIOTIN PO, Take 100 mg by mouth. , Disp: , Rfl:    CALCIUM PO, Take 1,000 mg by mouth. , Disp: , Rfl:    Cholecalciferol (VITAMIN D3) 50 MCG (2000 UT) capsule, Take 2,000 Units by mouth daily., Disp: , Rfl:    co-enzyme Q-10 30 MG capsule, Take 30 mg by mouth daily. , Disp: , Rfl:    DiphenhydrAMINE HCl (BENADRYL ALLERGY PO), Take 25 mg by mouth as needed., Disp: , Rfl:    hydrocortisone 2.5 % cream, Apply to face twice daily for 2 wks, then daily for 2 wks, then daily as needed for itching, Disp: , Rfl:    latanoprost (XALATAN) 0.005 % ophthalmic solution, Place 1 drop into the left eye at bedtime., Disp: , Rfl:    Multiple Vitamin (MULTIVITAMIN) tablet, Take 1 tablet by mouth daily., Disp: , Rfl:    RESTASIS 0.05 % ophthalmic emulsion, 1 drop 2 (two) times daily., Disp: , Rfl:    SELENIUM PO, Take by mouth. 200 mg, Disp: , Rfl:    SPIRULINA  PO, Take by mouth. As needed, Disp: , Rfl:    timolol (TIMOPTIC) 0.5 % ophthalmic solution, , Disp: , Rfl:    vitamin C (ASCORBIC ACID) 500 MG tablet, Take 500 mg by mouth daily., Disp: , Rfl:    pravastatin (PRAVACHOL) 20 MG tablet, Take 1 tablet (20 mg total) by mouth daily., Disp: 90 tablet, Rfl: 2   telmisartan (MICARDIS) 20 MG tablet, Take 1 tablet (20 mg total) by mouth daily., Disp: 90 tablet, Rfl: 2   Allergies  Allergen Reactions   Almond Oil Hives    Almonds   Bean Pod Extract Hives    String Beans   Corn-Containing Products Hives   Peanut-Containing Drug Products  Hives   Peanuts [Peanut Oil] Hives   Sodium Laureth Sulfate Hives   Soy Allergy Hives   Strawberry Extract Hives   Tomato Hives     Review of Systems  Constitutional: Negative.   HENT: Negative.    Respiratory: Negative.    Cardiovascular: Negative.   Gastrointestinal: Negative.   Endocrine: Negative for polydipsia, polyphagia and polyuria.  Neurological: Negative.   Psychiatric/Behavioral: Negative.       Today's Vitals   03/06/23 0948  BP: 110/74  Pulse: (!) 43  Temp: 98.1 F (36.7 C)  SpO2: 98%  Weight: 127 lb 12.8 oz (58 kg)  Height: 5\' 2"  (1.575 m)   Body mass index is 23.37 kg/m.  Wt Readings from Last 3 Encounters:  03/06/23 127 lb (57.6 kg)  03/06/23 127 lb 12.8 oz (58 kg)  10/16/22 131 lb (59.4 kg)     Objective:  Physical Exam Vitals and nursing note reviewed.  Constitutional:      Appearance: Normal appearance.  HENT:     Head: Normocephalic and atraumatic.  Eyes:     Extraocular Movements: Extraocular movements intact.  Cardiovascular:     Rate and Rhythm: Normal rate and regular rhythm.     Heart sounds: Normal heart sounds.  Pulmonary:     Effort: Pulmonary effort is normal.     Breath sounds: Normal breath sounds.  Musculoskeletal:     Cervical back: Normal range of motion.  Skin:    General: Skin is warm.  Neurological:     General: No focal deficit present.     Mental Status: She is alert.  Psychiatric:        Mood and Affect: Mood normal.        Behavior: Behavior normal.         Assessment And Plan:  Type 2 diabetes mellitus with stage 2 chronic kidney disease, without long-term current use of insulin (HCC) Assessment & Plan: Chronic, she has been able to control DM without meds. I will check an A1c today. She is encouraged to follow her regular exercise routine. She will f/u in four months for a full physical examination.   Orders: -     CMP14+EGFR -     Hemoglobin A1c -     Lipid panel  Pure  hypercholesterolemia Assessment & Plan: Chronic, LDL goal < 70.  Last lipid Nov 2023 was above goal. I will recheck lipid panel today. She is encouraged to follow a heart healthy lifestyle. 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   Orders: -     CMP14+EGFR -     Lipid panel  Asymptomatic bradycardia Assessment & Plan: Chronic, she feels well. She has had Cardiology evaluation in the past. Thyroid function  normal in Nov 2023.    Grief Assessment & Plan: She is taking it day by day. She has found solace in reading "Begin Again" by Fransico Michael.    Other orders -     Pravastatin Sodium; Take 1 tablet (20 mg total) by mouth daily.  Dispense: 90 tablet; Refill: 2 -     Telmisartan; Take 1 tablet (20 mg total) by mouth daily.  Dispense: 90 tablet; Refill: 2     Return if symptoms worsen or fail to improve.  Patient was given opportunity to ask questions. Patient verbalized understanding of the plan and was able to repeat key elements of the plan. All questions were answered to their satisfaction.   I, Gwynneth Aliment, MD, have reviewed all documentation for this visit. The documentation on 03/06/23 for the exam, diagnosis, procedures, and orders are all accurate and complete.   IF YOU HAVE BEEN REFERRED TO A SPECIALIST, IT MAY TAKE 1-2 WEEKS TO SCHEDULE/PROCESS THE REFERRAL. IF YOU HAVE NOT HEARD FROM US/SPECIALIST IN TWO WEEKS, PLEASE GIVE [place] A CALL AT [phone].   THE PATIENT IS ENCOURAGED TO PRACTICE SOCIAL DISTANCING DUE TO THE COVID-19 PANDEMIC.

## 2023-03-06 NOTE — Patient Instructions (Signed)

## 2023-03-06 NOTE — Assessment & Plan Note (Signed)
Chronic, LDL goal < 70.  Last lipid Nov 2023 was above goal. I will recheck lipid panel today. She is encouraged to follow a heart healthy lifestyle. 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

## 2023-03-06 NOTE — Assessment & Plan Note (Signed)
Chronic, she has been able to control DM without meds. I will check an A1c today. She is encouraged to follow her regular exercise routine. She will f/u in four months for a full physical examination.

## 2023-03-06 NOTE — Patient Instructions (Signed)
Ashley Lyons , Thank you for taking time to come for your Medicare Wellness Visit. I appreciate your ongoing commitment to your health goals. Please review the following plan we discussed and let me know if I can assist you in the future. one  Referrals/Orders/Follow-Ups/Clinician Recommendations: none  This is a list of the screening recommended for you and due dates:  Health Maintenance  Topic Date Due   Complete foot exam   05/31/2022   COVID-19 Vaccine (6 - 2023-24 season) 08/06/2022   Flu Shot  03/07/2023   Hemoglobin A1C  04/18/2023   Eye exam for diabetics  05/24/2023   Yearly kidney health urinalysis for diabetes  06/14/2023   Yearly kidney function blood test for diabetes  10/16/2023   Medicare Annual Wellness Visit  03/05/2024   DTaP/Tdap/Td vaccine (3 - Td or Tdap) 12/31/2032   Pneumonia Vaccine  Completed   DEXA scan (bone density measurement)  Completed   Zoster (Shingles) Vaccine  Completed   HPV Vaccine  Aged Out    Advanced directives: (Copy Requested) Please bring a copy of your health care power of attorney and living will to the office to be added to your chart at your convenience.  Next Medicare Annual Wellness Visit scheduled for next year: No, office will make appointment  Preventive Care 65 Years and Older, Female Preventive care refers to lifestyle choices and visits with your health care provider that can promote health and wellness. What does preventive care include? A yearly physical exam. This is also called an annual well check. Dental exams once or twice a year. Routine eye exams. Ask your health care provider how often you should have your eyes checked. Personal lifestyle choices, including: Daily care of your teeth and gums. Regular physical activity. Eating a healthy diet. Avoiding tobacco and drug use. Limiting alcohol use. Practicing safe sex. Taking low-dose aspirin every day. Taking vitamin and mineral supplements as recommended by your  health care provider. What happens during an annual well check? The services and screenings done by your health care provider during your annual well check will depend on your age, overall health, lifestyle risk factors, and family history of disease. Counseling  Your health care provider may ask you questions about your: Alcohol use. Tobacco use. Drug use. Emotional well-being. Home and relationship well-being. Sexual activity. Eating habits. History of falls. Memory and ability to understand (cognition). Work and work Astronomer. Reproductive health. Screening  You may have the following tests or measurements: Height, weight, and BMI. Blood pressure. Lipid and cholesterol levels. These may be checked every 5 years, or more frequently if you are over 40 years old. Skin check. Lung cancer screening. You may have this screening every year starting at age 62 if you have a 30-pack-year history of smoking and currently smoke or have quit within the past 15 years. Fecal occult blood test (FOBT) of the stool. You may have this test every year starting at age 18. Flexible sigmoidoscopy or colonoscopy. You may have a sigmoidoscopy every 5 years or a colonoscopy every 10 years starting at age 13. Hepatitis C blood test. Hepatitis B blood test. Sexually transmitted disease (STD) testing. Diabetes screening. This is done by checking your blood sugar (glucose) after you have not eaten for a while (fasting). You may have this done every 1-3 years. Bone density scan. This is done to screen for osteoporosis. You may have this done starting at age 20. Mammogram. This may be done every 1-2 years. Talk to your  health care provider about how often you should have regular mammograms. Talk with your health care provider about your test results, treatment options, and if necessary, the need for more tests. Vaccines  Your health care provider may recommend certain vaccines, such as: Influenza vaccine.  This is recommended every year. Tetanus, diphtheria, and acellular pertussis (Tdap, Td) vaccine. You may need a Td booster every 10 years. Zoster vaccine. You may need this after age 72. Pneumococcal 13-valent conjugate (PCV13) vaccine. One dose is recommended after age 17. Pneumococcal polysaccharide (PPSV23) vaccine. One dose is recommended after age 34. Talk to your health care provider about which screenings and vaccines you need and how often you need them. This information is not intended to replace advice given to you by your health care provider. Make sure you discuss any questions you have with your health care provider. Document Released: 08/19/2015 Document Revised: 04/11/2016 Document Reviewed: 05/24/2015 Elsevier Interactive Patient Education  2017 [employer].  Fall Prevention in the Home Falls can cause injuries. They can happen to people of all ages. There are many things you can do to make your home safe and to help prevent falls. What can I do on the outside of my home? Regularly fix the edges of walkways and driveways and fix any cracks. Remove anything that might make you trip as you walk through a door, such as a raised step or threshold. Trim any bushes or trees on the path to your home. Use bright outdoor lighting. Clear any walking paths of anything that might make someone trip, such as rocks or tools. Regularly check to see if handrails are loose or broken. Make sure that both sides of any steps have handrails. Any raised decks and porches should have guardrails on the edges. Have any leaves, snow, or ice cleared regularly. Use sand or salt on walking paths during winter. Clean up any spills in your garage right away. This includes oil or grease spills. What can I do in the bathroom? Use night lights. Install grab bars by the toilet and in the tub and shower. Do not use towel bars as grab bars. Use non-skid mats or decals in the tub or shower. If you need to sit  down in the shower, use a plastic, non-slip stool. Keep the floor dry. Clean up any water that spills on the floor as soon as it happens. Remove soap buildup in the tub or shower regularly. Attach bath mats securely with double-sided non-slip rug tape. Do not have throw rugs and other things on the floor that can make you trip. What can I do in the bedroom? Use night lights. Make sure that you have a light by your bed that is easy to reach. Do not use any sheets or blankets that are too big for your bed. They should not hang down onto the floor. Have a firm chair that has side arms. You can use this for support while you get dressed. Do not have throw rugs and other things on the floor that can make you trip. What can I do in the kitchen? Clean up any spills right away. Avoid walking on wet floors. Keep items that you use a lot in easy-to-reach places. If you need to reach something above you, use a strong step stool that has a grab bar. Keep electrical cords out of the way. Do not use floor polish or wax that makes floors slippery. If you must use wax, use non-skid floor wax. Do not have  throw rugs and other things on the floor that can make you trip. What can I do with my stairs? Do not leave any items on the stairs. Make sure that there are handrails on both sides of the stairs and use them. Fix handrails that are broken or loose. Make sure that handrails are as long as the stairways. Check any carpeting to make sure that it is firmly attached to the stairs. Fix any carpet that is loose or worn. Avoid having throw rugs at the top or bottom of the stairs. If you do have throw rugs, attach them to the floor with carpet tape. Make sure that you have a light switch at the top of the stairs and the bottom of the stairs. If you do not have them, ask someone to add them for you. What else can I do to help prevent falls? Wear shoes that: Do not have high heels. Have rubber bottoms. Are  comfortable and fit you well. Are closed at the toe. Do not wear sandals. If you use a stepladder: Make sure that it is fully opened. Do not climb a closed stepladder. Make sure that both sides of the stepladder are locked into place. Ask someone to hold it for you, if possible. Clearly mark and make sure that you can see: Any grab bars or handrails. First and last steps. Where the edge of each step is. Use tools that help you move around (mobility aids) if they are needed. These include: Canes. Walkers. Scooters. Crutches. Turn on the lights when you go into a dark area. Replace any light bulbs as soon as they burn out. Set up your furniture so you have a clear path. Avoid moving your furniture around. If any of your floors are uneven, fix them. If there are any pets around you, be aware of where they are. Review your medicines with your doctor. Some medicines can make you feel dizzy. This can increase your chance of falling. Ask your doctor what other things that you can do to help prevent falls. This information is not intended to replace advice given to you by your health care provider. Make sure you discuss any questions you have with your health care provider. Document Released: 05/19/2009 Document Revised: 12/29/2015 Document Reviewed: 08/27/2014 Elsevier Interactive Patient Education  2017 [employer].

## 2023-03-06 NOTE — Progress Notes (Signed)
Subjective:   Ashley Lyons is a 82 y.o. female who presents for Medicare Annual (Subsequent) preventive examination.  Visit Complete: In person  Patient Medicare AWV questionnaire was completed by the patient on 03/02/2023; I have confirmed that all information answered by patient is correct and no changes since this date.  Review of Systems     Cardiac Risk Factors include: advanced age (>81men, >54 women);diabetes mellitus     Objective:    Today's Vitals   03/06/23 0952  BP: 110/74  Pulse: (!) 43  Temp: 98.1 F (36.7 C)  TempSrc: Oral  SpO2: 98%  Weight: 127 lb (57.6 kg)  Height: 5\' 2"  (1.575 m)   Body mass index is 23.23 kg/m.     03/06/2023    9:56 AM 05/23/2022    8:38 AM 04/26/2021    8:51 AM 04/20/2020    8:54 AM 04/15/2019    8:50 AM  Advanced Directives  Does Patient Have a Medical Advance Directive? Yes Yes Yes Yes Yes  Type of Estate agent of [place];Living will Healthcare Power of [place];Living will Healthcare Power of [place];Living will Healthcare Power of [place];Living will Healthcare Power of [place];Living will  Copy of Healthcare Power of Attorney in Chart? No - copy requested No - copy requested No - copy requested No - copy requested No - copy requested    Current Medications (verified) Outpatient Encounter Medications as of 03/06/2023  Medication Sig   Barberry-Oreg Grape-Goldenseal (BERBERINE COMPLEX PO) Take 500 mg by mouth.   BIOTIN PO Take 100 mg by mouth.    CALCIUM PO Take 1,000 mg by mouth.    Cholecalciferol (VITAMIN D3) 50 MCG (2000 UT) capsule Take 2,000 Units by mouth daily.   co-enzyme Q-10 30 MG capsule Take 30 mg by mouth daily.    DiphenhydrAMINE HCl (BENADRYL ALLERGY PO) Take 25 mg by mouth as needed.   hydrocortisone 2.5 % cream Apply to face twice daily for 2 wks, then daily for 2 wks, then daily as needed for itching   latanoprost (XALATAN) 0.005 % ophthalmic solution Place 1 drop into the left eye  at bedtime.   Multiple Vitamin (MULTIVITAMIN) tablet Take 1 tablet by mouth daily.   pravastatin (PRAVACHOL) 20 MG tablet Take 1 tablet (20 mg total) by mouth daily.   RESTASIS 0.05 % ophthalmic emulsion 1 drop 2 (two) times daily.   SELENIUM PO Take by mouth. 200 mg   SPIRULINA PO Take by mouth. As needed   telmisartan (MICARDIS) 20 MG tablet Take 1 tablet (20 mg total) by mouth daily.   timolol (TIMOPTIC) 0.5 % ophthalmic solution    vitamin C (ASCORBIC ACID) 500 MG tablet Take 500 mg by mouth daily.   No facility-administered encounter medications on file as of 03/06/2023.    Allergies (verified) Almond oil, Bean pod extract, Corn-containing products, Peanut-containing drug products, Peanuts [peanut oil], Sodium laureth sulfate, Soy allergy, Strawberry extract, and Tomato   History: Past Medical History:  Diagnosis Date   Allergy    Diabetes mellitus    GERD (gastroesophageal reflux disease)    Glaucoma    right eye   Hyperlipidemia    Leukopenia    Primary hypertension 07/31/2022   Past Surgical History:  Procedure Laterality Date   APPENDECTOMY     COLONOSCOPY     TONSILLECTOMY     TUBAL LIGATION     VAGINAL HYSTERECTOMY     Family History  Problem Relation Age of Onset   Prostate  cancer Father    Diabetes Mother    Prostate cancer Brother    Kidney disease Sister    Liver disease Brother    Colon cancer Brother    Esophageal cancer Neg Hx    Rectal cancer Neg Hx    Stomach cancer Neg Hx    Social History   Socioeconomic History   Marital status: Widowed    Spouse name: Not on file   Number of children: Not on file   Years of education: Not on file   Highest education level: Master's degree (e.g., MA, MS, MEng, MEd, MSW, MBA)  Occupational History   Occupation: retired  Tobacco Use   Smoking status: Never   Smokeless tobacco: Never  Vaping Use   Vaping status: Never Used  Substance and Sexual Activity   Alcohol use: Not Currently    Comment: wine  on occasion   Drug use: No   Sexual activity: Not Currently  Other Topics Concern   Not on file  Social History Narrative   Not on file   Social Determinants of Health   Financial Resource Strain: Low Risk  (03/02/2023)   Overall Financial Resource Strain (CARDIA)    Difficulty of Paying Living Expenses: Not hard at all  Food Insecurity: No Food Insecurity (03/02/2023)   Hunger Vital Sign    Worried About Running Out of Food in the Last Year: Never true    Ran Out of Food in the Last Year: Never true  Transportation Needs: No Transportation Needs (03/02/2023)   PRAPARE - Administrator, Civil Service (Medical): No    Lack of Transportation (Non-Medical): No  Physical Activity: Insufficiently Active (03/02/2023)   Exercise Vital Sign    Days of Exercise per Week: 2 days    Minutes of Exercise per Session: 10 min  Stress: No Stress Concern Present (03/02/2023)   [employer] of Occupational Health - Occupational Stress Questionnaire    Feeling of Stress : Only a little  Social Connections: Moderately Integrated (03/02/2023)   Social Connection and Isolation Panel [NHANES]    Frequency of Communication with Friends and Family: More than three times a week    Frequency of Social Gatherings with Friends and Family: More than three times a week    Attends Religious Services: More than 4 times per year    Active Member of [employer] or Organizations: Yes    Attends Banker Meetings: 1 to 4 times per year    Marital Status: Widowed    Tobacco Counseling Counseling given: Not Answered   Clinical Intake:  Pre-visit preparation completed: Yes  Pain : No/denies pain     Nutritional Status: BMI of 19-24  Normal Nutritional Risks: None Diabetes: Yes CBG done?: No Did pt. bring in CBG monitor from home?: No  How often do you need to have someone help you when you read instructions, pamphlets, or other written materials from your doctor or pharmacy?: 1 -  Never  Interpreter Needed?: No  Information entered by :: NAllen LPN   Activities of Daily Living    03/02/2023   10:07 AM 05/23/2022    8:40 AM  In your present state of health, do you have any difficulty performing the following activities:  Hearing? 0 0  Vision? 0 0  Difficulty concentrating or making decisions? 0 0  Walking or climbing stairs? 0 0  Dressing or bathing? 0 0  Doing errands, shopping? 0 0  Preparing Food and eating ? N  N  Using the Toilet? N N  In the past six months, have you accidently leaked urine? N N  Do you have problems with loss of bowel control? N N  Managing your Medications? N N  Managing your Finances? N N  Housekeeping or managing your Housekeeping? N N    Patient Care Team: Dorothyann Peng, MD as PCP - General (Internal Medicine) Chilton Si, MD as PCP - Cardiology (Cardiology)  Indicate any recent Medical Services you may have received from other than [facility] providers in the past year (date may be approximate).     Assessment:   This is a routine wellness examination for Chanee.  Hearing/Vision screen Hearing Screening - Comments:: Denies hearing issues Vision Screening - Comments:: Regular eye exams, [facility]  Dietary issues and exercise activities discussed:     Goals Addressed             This Visit's Progress    Patient Stated       03/06/2023, wants to exercise       Depression Screen    03/06/2023    9:58 AM 03/06/2023    9:48 AM 05/23/2022    8:39 AM 04/26/2021    8:52 AM 04/20/2020    8:55 AM 04/15/2019    8:50 AM 11/12/2018    9:37 AM  PHQ 2/9 Scores  PHQ - 2 Score 0 0 0 0 0 0 0  PHQ- 9 Score 0 0    0     Fall Risk    03/06/2023    9:48 AM 03/02/2023   10:07 AM 05/23/2022    8:39 AM 04/26/2021    8:52 AM 04/20/2020    8:55 AM  Fall Risk   Falls in the past year? 0 0 0 1 0  Comment    tripped   Number falls in past yr: 0 0 0 0   Injury with Fall? 0 0 0 0   Risk for fall due to : No Fall Risks  Medication side effect Medication side effect Medication side effect No Fall Risks  Follow up Falls evaluation completed Falls prevention discussed;Falls evaluation completed Falls prevention discussed;Education provided;Falls evaluation completed Falls evaluation completed;Education provided;Falls prevention discussed Falls evaluation completed;Education provided;Falls prevention discussed    MEDICARE RISK AT HOME:  Medicare Risk at Home - 03/06/23 0957     Any stairs in or around the home? Yes    If so, are there any without handrails? No    Home free of loose throw rugs in walkways, pet beds, electrical cords, etc? Yes    Adequate lighting in your home to reduce risk of falls? Yes    Life alert? No    Use of a cane, walker or w/c? No    Grab bars in the bathroom? Yes    Shower chair or bench in shower? Yes    Elevated toilet seat or a handicapped toilet? No             TIMED UP AND GO:  Was the test performed?  Yes  Length of time to ambulate 10 feet: 5 sec Gait steady and fast without use of assistive device    Cognitive Function:        03/06/2023    9:58 AM 05/23/2022    8:40 AM 04/26/2021    8:54 AM 04/20/2020    8:57 AM 04/15/2019    8:53 AM  6CIT Screen  What Year? 0 points 0 points 0 points 0  points 0 points  What month? 0 points 0 points 0 points 0 points 0 points  What time? 3 points 0 points 0 points 0 points 0 points  Count back from 20 0 points 0 points 0 points 2 points 0 points  Months in reverse 0 points 0 points 0 points 0 points 0 points  Repeat phrase 2 points 0 points 2 points 2 points 0 points  Total Score 5 points 0 points 2 points 4 points 0 points    Immunizations Immunization History  Administered Date(s) Administered   DTaP 10/27/2012   Fluad Quad(high Dose 65+) 04/20/2020, 04/26/2021, 05/23/2022   Influenza, High Dose Seasonal PF 05/05/2018, 04/15/2019   Influenza-Unspecified 05/15/2018   PFIZER(Purple Top)SARS-COV-2 Vaccination  10/17/2019, 11/10/2019, 07/12/2020, 12/30/2020, 06/11/2022   Pneumococcal Conjugate-13 07/11/2016   Pneumococcal Polysaccharide-23 10/27/2010, 01/18/2022   Pneumococcal-Unspecified 07/21/2016   Rsv, Bivalent, Protein Subunit Rsvpref,pf Verdis Frederickson) 06/11/2022   Tdap 01/01/2023   Zoster Recombinant(Shingrix) 06/21/2021, 10/09/2021, 12/25/2021    TDAP status: Up to date  Flu Vaccine status: Up to date  Pneumococcal vaccine status: Up to date  Covid-19 vaccine status: Completed vaccines  Qualifies for Shingles Vaccine? Yes   Zostavax completed Yes   Shingrix Completed?: Yes  Screening Tests Health Maintenance  Topic Date Due   FOOT EXAM  05/31/2022   COVID-19 Vaccine (6 - 2023-24 season) 08/06/2022   INFLUENZA VACCINE  03/07/2023   HEMOGLOBIN A1C  04/18/2023   OPHTHALMOLOGY EXAM  05/24/2023   Diabetic kidney evaluation - Urine ACR  06/14/2023   Diabetic kidney evaluation - eGFR measurement  10/16/2023   Medicare Annual Wellness (AWV)  03/05/2024   DTaP/Tdap/Td (3 - Td or Tdap) 12/31/2032   Pneumonia Vaccine 80+ Years old  Completed   DEXA SCAN  Completed   Zoster Vaccines- Shingrix  Completed   HPV VACCINES  Aged Out    Health Maintenance  Health Maintenance Due  Topic Date Due   FOOT EXAM  05/31/2022   COVID-19 Vaccine (6 - 2023-24 season) 08/06/2022    Colorectal cancer screening: No longer required.   Mammogram status: No longer required due to age.  Bone Density status: Completed 12/04/2022.  Lung Cancer Screening: (Low Dose CT Chest recommended if Age 79-80 years, 20 pack-year currently smoking OR have quit w/in 15years.) does not qualify.   Lung Cancer Screening Referral: no  Additional Screening:  Hepatitis C Screening: does not qualify;   Vision Screening: Recommended annual ophthalmology exams for early detection of glaucoma and other disorders of the eye. Is the patient up to date with their annual eye exam?  Yes  Who is the provider or what is the  name of the office in which the patient attends annual eye exams? [facility] If pt is not established with a provider, would they like to be referred to a provider to establish care? No .   Dental Screening: Recommended annual dental exams for proper oral hygiene  Diabetic Foot Exam: Diabetic Foot Exam: Completed 05/31/2021  Community Resource Referral / Chronic Care Management: CRR required this visit?  No   CCM required this visit?  No     Plan:     I have personally reviewed and noted the following in the patient's chart:   Medical and social history Use of alcohol, tobacco or illicit drugs  Current medications and supplements including opioid prescriptions. Patient is not currently taking opioid prescriptions. Functional ability and status Nutritional status Physical activity Advanced directives List of other physicians Hospitalizations, surgeries,  and ER visits in previous 12 months Vitals Screenings to include cognitive, depression, and falls Referrals and appointments  In addition, I have reviewed and discussed with patient certain preventive protocols, quality metrics, and best practice recommendations. A written personalized care plan for preventive services as well as general preventive health recommendations were provided to patient.     Barb Merino, LPN   1/61/0960   After Visit Summary: in person  Nurse Notes: none

## 2023-03-06 NOTE — Assessment & Plan Note (Signed)
She is taking it day by day. She has found solace in reading "Begin Again" by Fransico Michael.

## 2023-03-27 DIAGNOSIS — H401122 Primary open-angle glaucoma, left eye, moderate stage: Secondary | ICD-10-CM | POA: Diagnosis not present

## 2023-03-27 DIAGNOSIS — H401111 Primary open-angle glaucoma, right eye, mild stage: Secondary | ICD-10-CM | POA: Diagnosis not present

## 2023-03-27 DIAGNOSIS — H26492 Other secondary cataract, left eye: Secondary | ICD-10-CM | POA: Diagnosis not present

## 2023-03-27 DIAGNOSIS — E119 Type 2 diabetes mellitus without complications: Secondary | ICD-10-CM | POA: Diagnosis not present

## 2023-06-18 ENCOUNTER — Ambulatory Visit (INDEPENDENT_AMBULATORY_CARE_PROVIDER_SITE_OTHER): Payer: Medicare Other | Admitting: Internal Medicine

## 2023-06-18 ENCOUNTER — Encounter: Payer: Self-pay | Admitting: Internal Medicine

## 2023-06-18 VITALS — BP 120/78 | HR 50 | Temp 97.5°F | Ht 62.0 in | Wt 126.4 lb

## 2023-06-18 DIAGNOSIS — E559 Vitamin D deficiency, unspecified: Secondary | ICD-10-CM | POA: Diagnosis not present

## 2023-06-18 DIAGNOSIS — E1122 Type 2 diabetes mellitus with diabetic chronic kidney disease: Secondary | ICD-10-CM

## 2023-06-18 DIAGNOSIS — N182 Chronic kidney disease, stage 2 (mild): Secondary | ICD-10-CM | POA: Diagnosis not present

## 2023-06-18 DIAGNOSIS — E7800 Pure hypercholesterolemia, unspecified: Secondary | ICD-10-CM | POA: Diagnosis not present

## 2023-06-18 DIAGNOSIS — M25561 Pain in right knee: Secondary | ICD-10-CM

## 2023-06-18 DIAGNOSIS — Z0000 Encounter for general adult medical examination without abnormal findings: Secondary | ICD-10-CM | POA: Diagnosis not present

## 2023-06-18 DIAGNOSIS — F5102 Adjustment insomnia: Secondary | ICD-10-CM | POA: Diagnosis not present

## 2023-06-18 LAB — POCT URINALYSIS DIPSTICK
Bilirubin, UA: NEGATIVE
Blood, UA: NEGATIVE
Glucose, UA: NEGATIVE
Ketones, UA: NEGATIVE
Nitrite, UA: NEGATIVE
Protein, UA: NEGATIVE
Spec Grav, UA: >=1.03 — AB (ref 1.010–1.025)
Urobilinogen, UA: 0.2 U/dL
pH, UA: 6 (ref 5.0–8.0)

## 2023-06-18 LAB — HM DIABETES EYE EXAM

## 2023-06-18 NOTE — Progress Notes (Signed)
I,Victoria T Deloria Lair, CMA,acting as a Neurosurgeon for Gwynneth Aliment, MD.,have documented all relevant documentation on the behalf of Gwynneth Aliment, MD,as directed by  Gwynneth Aliment, MD while in the presence of Gwynneth Aliment, MD.  Subjective:    Patient ID: Ashley Lyons , female    DOB: [birth year] , 82 y.o.   MRN: [id number]  Chief Complaint  Patient presents with   Annual Exam   Diabetes   Hyperlipidemia    HPI  She is here today for full physical examination. She is no longer followed by GYN. She reports compliance with meds. She denies headaches, chest pain and shortness of breath.   Letter sent to ophthalmologist for DM eye exam.   Diabetes She presents for her follow-up diabetic visit. She has type 2 diabetes mellitus. There are no hypoglycemic associated symptoms. Pertinent negatives for diabetes include no blurred vision and no chest pain. There are no hypoglycemic complications. Risk factors for coronary artery disease include diabetes mellitus, dyslipidemia, sedentary lifestyle and post-menopausal. Current diabetic treatment includes diet. She is compliant with treatment most of the time. She is following a diabetic diet. She participates in exercise intermittently. An ACE inhibitor/angiotensin II receptor blocker is not being taken. Eye exam is current.  Hyperlipidemia Pertinent negatives include no chest pain.     Past Medical History:  Diagnosis Date   Allergy    Diabetes mellitus    GERD (gastroesophageal reflux disease)    Glaucoma    right eye   Hyperlipidemia    Leukopenia    Primary hypertension 07/31/2022     Family History  Problem Relation Age of Onset   Prostate cancer Father    Diabetes Mother    Prostate cancer Brother    Kidney disease Sister    Liver disease Brother    Colon cancer Brother    Esophageal cancer Neg Hx    Rectal cancer Neg Hx    Stomach cancer Neg Hx      Current Outpatient Medications:    Barberry-Oreg Grape-Goldenseal  (BERBERINE COMPLEX PO), Take 500 mg by mouth., Disp: , Rfl:    BIOTIN PO, Take 100 mg by mouth. , Disp: , Rfl:    CALCIUM PO, Take 1,000 mg by mouth. , Disp: , Rfl:    Cholecalciferol (VITAMIN D3) 50 MCG (2000 UT) capsule, Take 2,000 Units by mouth daily., Disp: , Rfl:    co-enzyme Q-10 30 MG capsule, Take 30 mg by mouth daily. , Disp: , Rfl:    DiphenhydrAMINE HCl (BENADRYL ALLERGY PO), Take 25 mg by mouth as needed., Disp: , Rfl:    hydrocortisone 2.5 % cream, Apply to face twice daily for 2 wks, then daily for 2 wks, then daily as needed for itching, Disp: , Rfl:    latanoprost (XALATAN) 0.005 % ophthalmic solution, Place 1 drop into the left eye at bedtime., Disp: , Rfl:    Multiple Vitamin (MULTIVITAMIN) tablet, Take 1 tablet by mouth daily., Disp: , Rfl:    pravastatin (PRAVACHOL) 20 MG tablet, Take 1 tablet (20 mg total) by mouth daily., Disp: 90 tablet, Rfl: 2   RESTASIS 0.05 % ophthalmic emulsion, 1 drop 2 (two) times daily., Disp: , Rfl:    SELENIUM PO, Take by mouth. 200 mg, Disp: , Rfl:    SPIRULINA PO, Take by mouth. As needed, Disp: , Rfl:    telmisartan (MICARDIS) 20 MG tablet, Take 1 tablet (20 mg total) by mouth daily., Disp: 90 tablet, Rfl: 2  timolol (TIMOPTIC) 0.5 % ophthalmic solution, , Disp: , Rfl:    vitamin C (ASCORBIC ACID) 500 MG tablet, Take 500 mg by mouth daily., Disp: , Rfl:    Allergies  Allergen Reactions   Almond Oil Hives    Almonds   Bean Pod Extract Hives    String Beans   Corn-Containing Products Hives   Peanut-Containing Drug Products Hives   Peanuts [Peanut Oil] Hives   Sodium Laureth Sulfate Hives   Soy Allergy Hives   Strawberry Extract Hives   Tomato Hives      The patient states she uses status post hysterectomy for birth control. No LMP recorded. Patient has had a hysterectomy.. Negative for Dysmenorrhea. Negative for: breast discharge, breast lump(s), breast pain and breast self exam. Associated symptoms include abnormal vaginal  bleeding. Pertinent negatives include abnormal bleeding (hematology), anxiety, decreased libido, depression, difficulty falling sleep, dyspareunia, history of infertility, nocturia, sexual dysfunction, sleep disturbances, urinary incontinence, urinary urgency, vaginal discharge and vaginal itching. Diet regular.The patient states her exercise level is  intermittent.  . The patient's tobacco use is:  Social History   Tobacco Use  Smoking Status Never  Smokeless Tobacco Never  . She has been exposed to passive smoke. The patient's alcohol use is:  Social History   Substance and Sexual Activity  Alcohol Use Not Currently   Comment: wine on occasion    Review of Systems  Constitutional: Negative.   HENT: Negative.    Eyes: Negative.  Negative for blurred vision.  Respiratory: Negative.    Cardiovascular: Negative.  Negative for chest pain.  Gastrointestinal: Negative.   Endocrine: Negative.   Genitourinary: Negative.   Musculoskeletal:  Positive for arthralgias.       She c/o knee pain. No falls/trauma. Pain with ambulation.  Skin: Negative.   Allergic/Immunologic: Negative.   Neurological: Negative.   Hematological: Negative.   Psychiatric/Behavioral:  Positive for sleep disturbance.        She admits she has been having difficulty sleeping. It is difficult entering the holidays without her husband who passed earlier this year.      Today's Vitals   06/18/23 1115  BP: 120/78  Pulse: (!) 50  Temp: (!) 97.5 F (36.4 C)  SpO2: 98%  Weight: 126 lb 6.4 oz (57.3 kg)  Height: 5\' 2"  (1.575 m)   Body mass index is 23.12 kg/m.  Wt Readings from Last 3 Encounters:  06/18/23 126 lb 6.4 oz (57.3 kg)  03/06/23 127 lb (57.6 kg)  03/06/23 127 lb 12.8 oz (58 kg)     Objective:  Physical Exam Vitals and nursing note reviewed.  Constitutional:      Appearance: Normal appearance.  HENT:     Head: Normocephalic and atraumatic.     Right Ear: Tympanic membrane, ear canal and  external ear normal.     Left Ear: Tympanic membrane, ear canal and external ear normal.     Nose: Nose normal.     Mouth/Throat:     Mouth: Mucous membranes are moist.     Pharynx: Oropharynx is clear.  Eyes:     Extraocular Movements: Extraocular movements intact.     Conjunctiva/sclera: Conjunctivae normal.     Pupils: Pupils are equal, round, and reactive to light.  Cardiovascular:     Rate and Rhythm: Normal rate and regular rhythm.     Pulses: Normal pulses.          Dorsalis pedis pulses are 2+ on the right side and 2+ on  the left side.     Heart sounds: Normal heart sounds.  Pulmonary:     Effort: Pulmonary effort is normal.     Breath sounds: Normal breath sounds.  Chest:  Breasts:    Tanner Score is 5.     Right: Normal.     Left: Normal.  Abdominal:     General: Abdomen is flat. Bowel sounds are normal.     Palpations: Abdomen is soft.  Genitourinary:    Comments: deferred Musculoskeletal:        General: Tenderness present. Normal range of motion.     Cervical back: Normal range of motion and neck supple.  Feet:     Right foot:     Protective Sensation: 5 sites tested.  5 sites sensed.     Skin integrity: Skin integrity normal.     Toenail Condition: Right toenails are normal.     Left foot:     Protective Sensation: 5 sites tested.  5 sites sensed.     Skin integrity: Skin integrity normal.     Toenail Condition: Left toenails are normal.  Skin:    General: Skin is warm and dry.  Neurological:     General: No focal deficit present.     Mental Status: She is alert and oriented to person, place, and time.  Psychiatric:        Mood and Affect: Mood normal.        Behavior: Behavior normal.         Assessment And Plan:     Encounter for annual health examination Assessment & Plan: A full exam was performed.  Importance of monthly self breast exams was discussed with the patient.  She is advised to get 30-45 minutes of regular exercise, no less than  four to five days per week. Both weight-bearing and aerobic exercises are recommended.  She is advised to follow a healthy diet with at least six fruits/veggies per day, decrease intake of red meat and other saturated fats and to increase fish intake to twice weekly.  Meats/fish should not be fried -- baked, boiled or broiled is preferable. It is also important to cut back on your sugar intake.  Be sure to read labels - try to avoid anything with added sugar, high fructose corn syrup or other sweeteners.  If you must use a sweetener, you can try stevia or monkfruit.  It is also important to avoid artificially sweetened foods/beverages and diet drinks. Lastly, wear SPF 50 sunscreen on exposed skin and when in direct sunlight for an extended period of time.  Be sure to avoid fast food restaurants and aim for at least 60 ounces of water daily.       Type 2 diabetes mellitus with stage 2 chronic kidney disease, without long-term current use of insulin (HCC) Assessment & Plan: Chronic, she has been able to control DM without meds. She will f/u in four months.  I will check an A1c today. She is encouraged to follow her regular exercise routine. EKG performed, marked sinus bradycardia - no changes.  I DISCUSSED WITH THE PATIENT AT LENGTH REGARDING THE GOALS OF GLYCEMIC CONTROL AND POSSIBLE LONG-TERM COMPLICATIONS.  I  ALSO STRESSED THE IMPORTANCE OF COMPLIANCE WITH HOME GLUCOSE MONITORING, DIETARY RESTRICTIONS INCLUDING AVOIDANCE OF SUGARY DRINKS/PROCESSED FOODS,  ALONG WITH REGULAR EXERCISE.  I  ALSO STRESSED THE IMPORTANCE OF ANNUAL EYE EXAMS, SELF FOOT CARE AND COMPLIANCE WITH OFFICE VISITS.   Orders: -     Microalbumin /  creatinine urine ratio -     POCT urinalysis dipstick -     EKG 12-Lead -     CBC -     CMP14+EGFR -     Lipid panel -     Hemoglobin A1c  Pure hypercholesterolemia Assessment & Plan: Chronic, LDL goal < 70.  I will recheck lipid panel today. She is encouraged to follow a heart  healthy lifestyle.   Orders: -     CMP14+EGFR -     Lipid panel  Acute pain of right knee Assessment & Plan: She is advised to apply Voltaren gel to affected knee two to three times daily. If persistent, will consider Ortho evaluation and order radiographic studies.    Vitamin D deficiency Assessment & Plan: I WILL CHECK A VIT D LEVEL AND SUPPLEMENT AS NEEDED.  ALSO ENCOURAGED TO SPEND 15 MINUTES IN THE SUN DAILY.   Orders: -     VITAMIN D 25 Hydroxy (Vit-D Deficiency, Fractures)  Adjustment insomnia Assessment & Plan: She can consider magnesium glycinate nightly. If persistent, may consider rx meds.  I did consider doxepin,  trazodone and Rozerem; however, alert was given due to allergy to corn products.. (Will have to be considerate of fillers in meds due to her many sensitivities).      Return for 1 year HM, 4 month dm f/u. Patient was given opportunity to ask questions. Patient verbalized understanding of the plan and was able to repeat key elements of the plan. All questions were answered to their satisfaction.     I, Gwynneth Aliment, MD, have reviewed all documentation for this visit. The documentation on 06/18/23 for the exam, diagnosis, procedures, and orders are all accurate and complete.

## 2023-06-18 NOTE — Patient Instructions (Signed)

## 2023-06-18 NOTE — Assessment & Plan Note (Signed)

## 2023-06-19 LAB — CMP14+EGFR
ALT: 10 [IU]/L (ref 0–32)
AST: 19 [IU]/L (ref 0–40)
Albumin: 4.3 g/dL (ref 3.7–4.7)
Alkaline Phosphatase: 63 [IU]/L (ref 44–121)
BUN/Creatinine Ratio: 18 (ref 12–28)
BUN: 15 mg/dL (ref 8–27)
Bilirubin Total: 0.5 mg/dL (ref 0.0–1.2)
CO2: 23 mmol/L (ref 20–29)
Calcium: 9.3 mg/dL (ref 8.7–10.3)
Chloride: 102 mmol/L (ref 96–106)
Creatinine, Ser: 0.82 mg/dL (ref 0.57–1.00)
Globulin, Total: 3.2 g/dL (ref 1.5–4.5)
Glucose: 102 mg/dL — ABNORMAL HIGH (ref 70–99)
Potassium: 4.5 mmol/L (ref 3.5–5.2)
Sodium: 140 mmol/L (ref 134–144)
Total Protein: 7.5 g/dL (ref 6.0–8.5)
eGFR: 70 mL/min/{1.73_m2} (ref >59)

## 2023-06-19 LAB — MICROALBUMIN / CREATININE URINE RATIO
Creatinine, Urine: 70 mg/dL
Microalb/Creat Ratio: 6 mg/g{creat} (ref 0–29)
Microalbumin, Urine: 4.5 ug/mL

## 2023-06-19 LAB — CBC
Hematocrit: 41.8 % (ref 34.0–46.6)
Hemoglobin: 13.9 g/dL (ref 11.1–15.9)
MCH: 33.9 pg — ABNORMAL HIGH (ref 26.6–33.0)
MCHC: 33.3 g/dL (ref 31.5–35.7)
MCV: 102 fL — ABNORMAL HIGH (ref 79–97)
Platelets: 267 10*3/uL (ref 150–450)
RBC: 4.1 x10E6/uL (ref 3.77–5.28)
RDW: 12.5 % (ref 11.7–15.4)
WBC: 3.7 10*3/uL (ref 3.4–10.8)

## 2023-06-19 LAB — HEMOGLOBIN A1C
Est. average glucose Bld gHb Est-mCnc: 146 mg/dL
Hgb A1c MFr Bld: 6.7 % — ABNORMAL HIGH (ref 4.8–5.6)

## 2023-06-19 LAB — LIPID PANEL
Chol/HDL Ratio: 3.1 ratio (ref 0.0–4.4)
Cholesterol, Total: 229 mg/dL — ABNORMAL HIGH (ref 100–199)
HDL: 73 mg/dL (ref >39)
LDL Chol Calc (NIH): 142 mg/dL — ABNORMAL HIGH (ref 0–99)
Triglycerides: 81 mg/dL (ref 0–149)
VLDL Cholesterol Cal: 14 mg/dL (ref 5–40)

## 2023-06-19 LAB — VITAMIN D 25 HYDROXY (VIT D DEFICIENCY, FRACTURES): Vit D, 25-Hydroxy: 56.2 ng/mL (ref 30.0–100.0)

## 2023-06-30 DIAGNOSIS — M25561 Pain in right knee: Secondary | ICD-10-CM | POA: Insufficient documentation

## 2023-06-30 DIAGNOSIS — E559 Vitamin D deficiency, unspecified: Secondary | ICD-10-CM | POA: Insufficient documentation

## 2023-06-30 DIAGNOSIS — F5102 Adjustment insomnia: Secondary | ICD-10-CM | POA: Insufficient documentation

## 2023-06-30 NOTE — Assessment & Plan Note (Addendum)
Chronic, she has been able to control DM without meds. She will f/u in four months.  I will check an A1c today. She is encouraged to follow her regular exercise routine. EKG performed, marked sinus bradycardia - no changes.  I DISCUSSED WITH THE PATIENT AT LENGTH REGARDING THE GOALS OF GLYCEMIC CONTROL AND POSSIBLE LONG-TERM COMPLICATIONS.  I  ALSO STRESSED THE IMPORTANCE OF COMPLIANCE WITH HOME GLUCOSE MONITORING, DIETARY RESTRICTIONS INCLUDING AVOIDANCE OF SUGARY DRINKS/PROCESSED FOODS,  ALONG WITH REGULAR EXERCISE.  I  ALSO STRESSED THE IMPORTANCE OF ANNUAL EYE EXAMS, SELF FOOT CARE AND COMPLIANCE WITH OFFICE VISITS.

## 2023-06-30 NOTE — Assessment & Plan Note (Signed)
She is advised to apply Voltaren gel to affected knee two to three times daily. If persistent, will consider Ortho evaluation and order radiographic studies.

## 2023-06-30 NOTE — Assessment & Plan Note (Signed)
I WILL CHECK A VIT D LEVEL AND SUPPLEMENT AS NEEDED.  ALSO ENCOURAGED TO SPEND 15 MINUTES IN THE SUN DAILY.

## 2023-06-30 NOTE — Assessment & Plan Note (Signed)
Chronic, LDL goal < 70.  I will recheck lipid panel today. She is encouraged to follow a heart healthy lifestyle.

## 2023-06-30 NOTE — Assessment & Plan Note (Addendum)
She can consider magnesium glycinate nightly. If persistent, may consider rx meds.  I did consider doxepin,  trazodone and Rozerem; however, alert was given due to allergy to corn products.. (Will have to be considerate of fillers in meds due to her many sensitivities).

## 2023-10-02 DIAGNOSIS — H5213 Myopia, bilateral: Secondary | ICD-10-CM | POA: Diagnosis not present

## 2023-10-02 DIAGNOSIS — H524 Presbyopia: Secondary | ICD-10-CM | POA: Diagnosis not present

## 2023-10-02 DIAGNOSIS — H401122 Primary open-angle glaucoma, left eye, moderate stage: Secondary | ICD-10-CM | POA: Diagnosis not present

## 2023-10-02 DIAGNOSIS — E119 Type 2 diabetes mellitus without complications: Secondary | ICD-10-CM | POA: Diagnosis not present

## 2023-10-02 DIAGNOSIS — H401111 Primary open-angle glaucoma, right eye, mild stage: Secondary | ICD-10-CM | POA: Diagnosis not present

## 2023-10-02 DIAGNOSIS — H43812 Vitreous degeneration, left eye: Secondary | ICD-10-CM | POA: Diagnosis not present

## 2023-10-02 LAB — HM DIABETES EYE EXAM

## 2023-10-03 ENCOUNTER — Encounter: Payer: Self-pay | Admitting: Osteopathic Medicine

## 2023-10-03 ENCOUNTER — Encounter: Payer: Self-pay | Admitting: Internal Medicine

## 2023-10-28 ENCOUNTER — Ambulatory Visit: Payer: Self-pay | Admitting: Internal Medicine

## 2023-10-28 ENCOUNTER — Ambulatory Visit (INDEPENDENT_AMBULATORY_CARE_PROVIDER_SITE_OTHER): Payer: Medicare Other | Admitting: Internal Medicine

## 2023-10-28 ENCOUNTER — Encounter: Payer: Self-pay | Admitting: Internal Medicine

## 2023-10-28 VITALS — BP 140/70 | HR 56 | Temp 98.4°F | Ht 62.0 in | Wt 135.6 lb

## 2023-10-28 DIAGNOSIS — E1122 Type 2 diabetes mellitus with diabetic chronic kidney disease: Secondary | ICD-10-CM

## 2023-10-28 DIAGNOSIS — E7800 Pure hypercholesterolemia, unspecified: Secondary | ICD-10-CM

## 2023-10-28 DIAGNOSIS — I129 Hypertensive chronic kidney disease with stage 1 through stage 4 chronic kidney disease, or unspecified chronic kidney disease: Secondary | ICD-10-CM | POA: Diagnosis not present

## 2023-10-28 DIAGNOSIS — N182 Chronic kidney disease, stage 2 (mild): Secondary | ICD-10-CM | POA: Diagnosis not present

## 2023-10-28 DIAGNOSIS — Z23 Encounter for immunization: Secondary | ICD-10-CM | POA: Diagnosis not present

## 2023-10-28 MED ORDER — PRAVASTATIN SODIUM 40 MG PO TABS
ORAL_TABLET | ORAL | 2 refills | Status: DC
Start: 2023-10-28 — End: 2023-11-01

## 2023-10-28 NOTE — Assessment & Plan Note (Signed)
 Chronic, she has been able to control DM without meds. She will f/u in four months.  I will check an A1c today. She is encouraged to follow her regular exercise routine.

## 2023-10-28 NOTE — Assessment & Plan Note (Signed)
 Chronic, LDL was above goal at her last visit.  She agrees to increase dose of pravastatin to 40mg  daily M-F. She will take TWO 20mg  tabs until she runs out. She agrees to rto in 3 months for cholesterol check.

## 2023-10-28 NOTE — Patient Instructions (Signed)

## 2023-10-28 NOTE — Assessment & Plan Note (Signed)
 Chronic, uncontrolled.  She does admit to feeling stressed this morning. She will continue with telmisartan 20mg  daily for now. I will re-evaluate at her next visit. No med changes today. She is reminded to follow low sodium diet.

## 2023-10-28 NOTE — Progress Notes (Signed)
 I,Victoria T Deloria Lair, CMA,acting as a Neurosurgeon for Gwynneth Aliment, MD.,have documented all relevant documentation on the behalf of Gwynneth Aliment, MD,as directed by  Gwynneth Aliment, MD while in the presence of Gwynneth Aliment, MD.  Subjective:  Patient ID: Ashley Lyons , female    DOB: 10-Aug-1940 , 83 y.o.   MRN: [id number]  Chief Complaint  Patient presents with   Diabetes    HPI  She presents today for DM & cholesterol check. She reports she has not been doing what she is supposed to be doing.  She states her family threw her a surprise birthday party this past Saturday. This lifted her spirits. She denies having any headaches, chest pain and shortness of breath.   She denies having any specific questions or concerns.       Diabetes She presents for her follow-up diabetic visit. She has type 2 diabetes mellitus. Her disease course has been stable. There are no hypoglycemic associated symptoms. Pertinent negatives for diabetes include no polydipsia, no polyphagia and no polyuria. There are no hypoglycemic complications. Risk factors for coronary artery disease include dyslipidemia, diabetes mellitus, sedentary lifestyle and post-menopausal. She is following a generally healthy diet. She participates in exercise intermittently. There is no change in her home blood glucose trend. Her breakfast blood glucose is taken between 7-8 am. Her breakfast blood glucose range is generally 90-110 mg/dl. An ACE inhibitor/angiotensin II receptor blocker is not being taken. Eye exam is current.  Hyperlipidemia This is a chronic problem. The problem is controlled. Exacerbating diseases include diabetes. Current antihyperlipidemic treatment includes statins and diet change. The current treatment provides moderate improvement of lipids. Risk factors for coronary artery disease include diabetes mellitus, dyslipidemia and post-menopausal.     Past Medical History:  Diagnosis Date   Allergy    Diabetes  mellitus    GERD (gastroesophageal reflux disease)    Glaucoma    right eye   Hyperlipidemia    Leukopenia    Primary hypertension 07/31/2022     Family History  Problem Relation Age of Onset   Prostate cancer Father    Diabetes Mother    Prostate cancer Brother    Kidney disease Sister    Liver disease Brother    Colon cancer Brother    Esophageal cancer Neg Hx    Rectal cancer Neg Hx    Stomach cancer Neg Hx      Current Outpatient Medications:    Barberry-Oreg Grape-Goldenseal (BERBERINE COMPLEX PO), Take 500 mg by mouth., Disp: , Rfl:    BIOTIN PO, Take 100 mg by mouth. , Disp: , Rfl:    CALCIUM PO, Take 1,000 mg by mouth. , Disp: , Rfl:    Cholecalciferol (VITAMIN D3) 50 MCG (2000 UT) capsule, Take 2,000 Units by mouth daily., Disp: , Rfl:    co-enzyme Q-10 30 MG capsule, Take 30 mg by mouth daily. , Disp: , Rfl:    DiphenhydrAMINE HCl (BENADRYL ALLERGY PO), Take 25 mg by mouth as needed., Disp: , Rfl:    hydrocortisone 2.5 % cream, Apply to face twice daily for 2 wks, then daily for 2 wks, then daily as needed for itching, Disp: , Rfl:    latanoprost (XALATAN) 0.005 % ophthalmic solution, Place 1 drop into the left eye at bedtime., Disp: , Rfl:    Multiple Vitamin (MULTIVITAMIN) tablet, Take 1 tablet by mouth daily., Disp: , Rfl:    pravastatin (PRAVACHOL) 40 MG tablet, One tab po daily except  Monday thru Fridays, skipping the weekends, Disp: 75 tablet, Rfl: 2   RESTASIS 0.05 % ophthalmic emulsion, 1 drop 2 (two) times daily., Disp: , Rfl:    SPIRULINA PO, Take by mouth. As needed, Disp: , Rfl:    telmisartan (MICARDIS) 20 MG tablet, Take 1 tablet (20 mg total) by mouth daily., Disp: 90 tablet, Rfl: 2   timolol (TIMOPTIC) 0.5 % ophthalmic solution, , Disp: , Rfl:    vitamin C (ASCORBIC ACID) 500 MG tablet, Take 500 mg by mouth daily., Disp: , Rfl:    SELENIUM PO, Take by mouth. 200 mg (Patient not taking: Reported on 10/28/2023), Disp: , Rfl:    Allergies  Allergen  Reactions   Almond Oil Hives    Almonds   Bean Pod Extract Hives    String Beans   Corn-Containing Products Hives   Peanut-Containing Drug Products Hives   Peanuts [Peanut Oil] Hives   Sodium Laureth Sulfate Hives   Soy Allergy (Obsolete) Hives   Strawberry Extract Hives   Tomato Hives     Review of Systems  Constitutional: Negative.   Respiratory: Negative.    Cardiovascular: Negative.   Gastrointestinal: Negative.   Endocrine: Negative for polydipsia, polyphagia and polyuria.  Neurological: Negative.   Psychiatric/Behavioral: Negative.       Today's Vitals   10/28/23 1105 10/28/23 1119  BP: (!) 160/70 (!) 140/70  Pulse: (!) 56   Temp: 98.4 F (36.9 C)   TempSrc: Oral   Weight: 135 lb 9.6 oz (61.5 kg)   Height: 5\' 2"  (1.575 m)   PainSc: 0-No pain    Body mass index is 24.8 kg/m.  Wt Readings from Last 3 Encounters:  10/28/23 135 lb 9.6 oz (61.5 kg)  06/18/23 126 lb 6.4 oz (57.3 kg)  03/06/23 127 lb (57.6 kg)    BP Readings from Last 3 Encounters:  10/28/23 (!) 140/70  06/18/23 120/78  03/06/23 110/74    Objective:  Physical Exam Vitals and nursing note reviewed.  Constitutional:      Appearance: Normal appearance.  HENT:     Head: Normocephalic and atraumatic.  Eyes:     Extraocular Movements: Extraocular movements intact.  Cardiovascular:     Rate and Rhythm: Normal rate and regular rhythm.     Heart sounds: Normal heart sounds.  Pulmonary:     Effort: Pulmonary effort is normal.     Breath sounds: Normal breath sounds.  Musculoskeletal:     Cervical back: Normal range of motion.  Skin:    General: Skin is warm.  Neurological:     General: No focal deficit present.     Mental Status: She is alert.  Psychiatric:        Mood and Affect: Mood normal.        Behavior: Behavior normal.         Assessment And Plan:  Type 2 diabetes mellitus with stage 2 chronic kidney disease, without long-term current use of insulin (HCC) Assessment &  Plan: Chronic, she has been able to control DM without meds. She will f/u in four months.  I will check an A1c today. She is encouraged to follow her regular exercise routine.   Orders: -     CMP14+EGFR -     Hemoglobin A1c -     TSH  Parenchymal renal hypertension, stage 1 through stage 4 or unspecified chronic kidney disease Assessment & Plan: Chronic, uncontrolled.  She does admit to feeling stressed this morning. She will continue with  telmisartan 20mg  daily for now. I will re-evaluate at her next visit. No med changes today. She is reminded to follow low sodium diet.   Orders: -     CMP14+EGFR  Pure hypercholesterolemia Assessment & Plan: Chronic, LDL was above goal at her last visit.  She agrees to increase dose of pravastatin to 40mg  daily M-F. She will take TWO 20mg  tabs until she runs out. She agrees to rto in 3 months for cholesterol check.   Orders: -     CMP14+EGFR -     TSH  Need for influenza vaccination -     Flu Vaccine Trivalent High Dose (Fluad)  Other orders -     Pravastatin Sodium; One tab po daily except Monday thru Fridays, skipping the weekends  Dispense: 75 tablet; Refill: 2     Return in 3 months (on 01/28/2024), or dm/chol check, cancel RS August appt.  Patient was given opportunity to ask questions. Patient verbalized understanding of the plan and was able to repeat key elements of the plan. All questions were answered to their satisfaction.    I, Gwynneth Aliment, MD, have reviewed all documentation for this visit. The documentation on 10/28/23 for the exam, diagnosis, procedures, and orders are all accurate and complete.   IF YOU HAVE BEEN REFERRED TO A SPECIALIST, IT MAY TAKE 1-2 WEEKS TO SCHEDULE/PROCESS THE REFERRAL. IF YOU HAVE NOT HEARD FROM US/SPECIALIST IN TWO WEEKS, PLEASE GIVE [place] A CALL AT [phone].   THE PATIENT IS ENCOURAGED TO PRACTICE SOCIAL DISTANCING DUE TO THE COVID-19 PANDEMIC.

## 2023-10-28 NOTE — Telephone Encounter (Signed)
 Copied from CRM [id number]. Topic: Clinical - Medication Question >> Oct 28, 2023 12:06 PM Everette C wrote: Reason for CRM: Clarisse Gouge with [facility] has called to request contact with a member of staff for clarity regarding a patient's prescription of pravastatin (PRAVACHOL) 40 MG tablet [[id number]    Chief Complaint: RX issue  Additional Notes: Need to clarify order details on RX for pravastatin   Additional Information  Commented on: All Negative - Call PCP When Office is Open    Prescription issue  Answer Assessment - Initial Assessment Questions 1. REASON FOR CALL or QUESTION: "What is your reason for calling today?" or "How can I best help you?" or "What question do you have that I can help answer?"     Need to clarify RX detail 2. CALLER: Document the source of call. (e.g., laboratory, patient).     Clarisse Gouge, [facility] [employer] [facility]  Protocols used: PCP Call - No Triage-A-AH

## 2023-10-29 LAB — CMP14+EGFR
ALT: 13 IU/L (ref 0–32)
AST: 26 IU/L (ref 0–40)
Albumin: 4.1 g/dL (ref 3.7–4.7)
Alkaline Phosphatase: 59 IU/L (ref 44–121)
BUN/Creatinine Ratio: 19 (ref 12–28)
BUN: 14 mg/dL (ref 8–27)
Bilirubin Total: 0.4 mg/dL (ref 0.0–1.2)
CO2: 23 mmol/L (ref 20–29)
Calcium: 9.3 mg/dL (ref 8.7–10.3)
Chloride: 103 mmol/L (ref 96–106)
Creatinine, Ser: 0.74 mg/dL (ref 0.57–1.00)
Globulin, Total: 3.2 g/dL (ref 1.5–4.5)
Glucose: 103 mg/dL — ABNORMAL HIGH (ref 70–99)
Potassium: 4.6 mmol/L (ref 3.5–5.2)
Sodium: 141 mmol/L (ref 134–144)
Total Protein: 7.3 g/dL (ref 6.0–8.5)
eGFR: 79 mL/min/{1.73_m2} (ref >59)

## 2023-10-29 LAB — HEMOGLOBIN A1C
Est. average glucose Bld gHb Est-mCnc: 146 mg/dL
Hgb A1c MFr Bld: 6.7 % — ABNORMAL HIGH (ref 4.8–5.6)

## 2023-10-29 LAB — TSH: TSH: 1.5 u[IU]/mL (ref 0.450–4.500)

## 2023-10-30 ENCOUNTER — Other Ambulatory Visit: Payer: Self-pay | Admitting: Internal Medicine

## 2023-11-01 ENCOUNTER — Other Ambulatory Visit: Payer: Self-pay

## 2023-11-01 MED ORDER — PRAVASTATIN SODIUM 40 MG PO TABS: ORAL_TABLET | ORAL | 2 refills | Status: DC

## 2023-11-04 ENCOUNTER — Encounter: Payer: Self-pay | Admitting: Internal Medicine

## 2023-12-26 ENCOUNTER — Other Ambulatory Visit: Payer: Self-pay | Admitting: Internal Medicine

## 2024-01-28 ENCOUNTER — Encounter: Payer: Self-pay | Admitting: Internal Medicine

## 2024-01-28 ENCOUNTER — Ambulatory Visit: Admitting: Internal Medicine

## 2024-01-28 VITALS — BP 110/70 | HR 52 | Temp 98.2°F | Ht 62.0 in | Wt 135.0 lb

## 2024-01-28 DIAGNOSIS — E1122 Type 2 diabetes mellitus with diabetic chronic kidney disease: Secondary | ICD-10-CM | POA: Diagnosis not present

## 2024-01-28 DIAGNOSIS — I129 Hypertensive chronic kidney disease with stage 1 through stage 4 chronic kidney disease, or unspecified chronic kidney disease: Secondary | ICD-10-CM | POA: Diagnosis not present

## 2024-01-28 DIAGNOSIS — N182 Chronic kidney disease, stage 2 (mild): Secondary | ICD-10-CM | POA: Diagnosis not present

## 2024-01-28 DIAGNOSIS — E7800 Pure hypercholesterolemia, unspecified: Secondary | ICD-10-CM | POA: Diagnosis not present

## 2024-01-28 NOTE — Assessment & Plan Note (Signed)
 Chronic, LDL was above goal at her last visit.  She has been taking pravastatin  to 40mg  daily M-F.  - Resume regular exercise.

## 2024-01-28 NOTE — Patient Instructions (Signed)

## 2024-01-28 NOTE — Assessment & Plan Note (Addendum)
 Chronic, she has been able to control DM without meds. She will f/u in four months.  I will check an A1c today. She is encouraged to follow her regular exercise routine.  - Reminded to stay well hydrated to address CKD

## 2024-01-28 NOTE — Assessment & Plan Note (Addendum)
 Chronic, well controlled.  She will continue with telmisartan  20mg  daily for now.  - She is reminded to follow low sodium diet.

## 2024-01-28 NOTE — Progress Notes (Signed)
 I,Ashley Lyons, CMA,acting as a Neurosurgeon for Ashley LOISE Slocumb, MD.,have documented all relevant documentation on the behalf of Ashley LOISE Slocumb, MD,as directed by  Ashley LOISE Slocumb, MD while in the presence of Ashley LOISE Slocumb, MD.  Subjective:  Patient ID: Ashley Lyons , female    DOB: 1940-09-29 , 83 y.o.   MRN: [id number]  Chief Complaint  Patient presents with   Diabetes    She presents today for DM & cholesterol check. She reports she has not been doing what she is supposed to be doing. she denies having any headaches, chest pain and shortness of breath. She denies having any specific questions or concerns.    Hypertension    HPI Discussed the use of AI scribe software for clinical note transcription with the patient, who gave verbal consent to proceed.  History of Present Illness Ashley Lyons is an 83 year old female with diabetes who presents for a routine follow-up visit.  She has no specific concerns today and adheres to her medication regimen, including telmisartan  20 mg daily for hypertension, pravastatin  40 mg Monday through Friday for hyperlipidemia, and various eye drops including timolol, Restasis, and Xalatan for ocular hypertension. No need for refills is reported.  She controls diabetes with lifestyle modification.  Her recent activities have been irregular due to family events, such as her great grandson's high school graduation and her granddaughter's completion of residency. This has impacted her exercise routine, which she describes as 'generally around the house.' She acknowledges a need to become more disciplined with walking.  She has not eaten much today, only consuming half a cup of orange juice and cranberry juice, attributing this to waking up late and getting busy with other tasks. She works from home, allowing her flexibility in her schedule.  She has a garden and is aware of the need to stay hydrated and take breaks when working outside in the heat,  especially with the current high temperatures. She plans to limit her outdoor activities to the evening or morning to avoid the heat.   Diabetes She presents for her follow-up diabetic visit. She has type 2 diabetes mellitus. Her disease course has been stable. There are no hypoglycemic associated symptoms. Pertinent negatives for diabetes include no polydipsia, no polyphagia and no polyuria. There are no hypoglycemic complications. Risk factors for coronary artery disease include dyslipidemia, diabetes mellitus, sedentary lifestyle and post-menopausal. She is following a generally healthy diet. She participates in exercise intermittently. There is no change in her home blood glucose trend. Her breakfast blood glucose is taken between 7-8 am. Her breakfast blood glucose range is generally 90-110 mg/dl. An ACE inhibitor/angiotensin II receptor blocker is not being taken. Eye exam is current.  Hyperlipidemia This is a chronic problem. The problem is controlled. Exacerbating diseases include diabetes. Current antihyperlipidemic treatment includes statins and diet change. The current treatment provides moderate improvement of lipids. Risk factors for coronary artery disease include diabetes mellitus, dyslipidemia and post-menopausal.     Past Medical History:  Diagnosis Date   Allergy    Diabetes mellitus    GERD (gastroesophageal reflux disease)    Glaucoma    right eye   Hyperlipidemia    Leukopenia    Primary hypertension 07/31/2022     Family History  Problem Relation Age of Onset   Prostate cancer Father    Diabetes Mother    Prostate cancer Brother    Kidney disease Sister    Liver disease Brother  Colon cancer Brother    Esophageal cancer Neg Hx    Rectal cancer Neg Hx    Stomach cancer Neg Hx      Current Outpatient Medications:    Barberry-Oreg Grape-Goldenseal (BERBERINE COMPLEX PO), Take 500 mg by mouth., Disp: , Rfl:    BIOTIN PO, Take 100 mg by mouth. , Disp: , Rfl:     CALCIUM PO, Take 1,000 mg by mouth. , Disp: , Rfl:    Cholecalciferol (VITAMIN D3) 50 MCG (2000 UT) capsule, Take 2,000 Units by mouth daily., Disp: , Rfl:    co-enzyme Q-10 30 MG capsule, Take 30 mg by mouth daily. , Disp: , Rfl:    DiphenhydrAMINE HCl (BENADRYL ALLERGY PO), Take 25 mg by mouth as needed., Disp: , Rfl:    hydrocortisone 2.5 % cream, Apply to face twice daily for 2 wks, then daily for 2 wks, then daily as needed for itching, Disp: , Rfl:    latanoprost (XALATAN) 0.005 % ophthalmic solution, Place 1 drop into the left eye at bedtime., Disp: , Rfl:    Multiple Vitamin (MULTIVITAMIN) tablet, Take 1 tablet by mouth daily., Disp: , Rfl:    pravastatin  (PRAVACHOL ) 40 MG tablet, One tab po daily MON, TUES, WED, THURS, FRI skip SAT & SUN., Disp: 75 tablet, Rfl: 2   RESTASIS 0.05 % ophthalmic emulsion, 1 drop 2 (two) times daily., Disp: , Rfl:    SELENIUM PO, Take by mouth. 200 mg, Disp: , Rfl:    SPIRULINA PO, Take by mouth. As needed, Disp: , Rfl:    telmisartan  (MICARDIS ) 20 MG tablet, Take 1 tablet by mouth once daily, Disp: 90 tablet, Rfl: 0   timolol (TIMOPTIC) 0.5 % ophthalmic solution, , Disp: , Rfl:    vitamin C (ASCORBIC ACID) 500 MG tablet, Take 500 mg by mouth daily., Disp: , Rfl:    Allergies  Allergen Reactions   Almond Oil Hives    Almonds   Bean Pod Extract Hives    String Beans   Corn-Containing Products Hives   Peanut-Containing Drug Products Hives   Peanuts [Peanut Oil] Hives   Sodium Laureth Sulfate Hives   Soy Allergy (Obsolete) Hives   Strawberry Extract Hives   Tomato Hives     Review of Systems  Constitutional: Negative.   Eyes: Negative.   Respiratory: Negative.    Cardiovascular: Negative.   Gastrointestinal: Negative.   Endocrine: Negative for polydipsia, polyphagia and polyuria.  Musculoskeletal: Negative.   Skin: Negative.   Psychiatric/Behavioral: Negative.       Today's Vitals   01/28/24 1438  BP: 110/70  Pulse: (!) 52  Temp: 98.2  F (36.8 C)  TempSrc: Oral  Weight: 135 lb (61.2 kg)  Height: 5' 2 (1.575 m)  PainSc: 0-No pain   Body mass index is 24.69 kg/m.  Wt Readings from Last 3 Encounters:  01/28/24 135 lb (61.2 kg)  10/28/23 135 lb 9.6 oz (61.5 kg)  06/18/23 126 lb 6.4 oz (57.3 kg)     Objective:  Physical Exam Vitals and nursing note reviewed.  Constitutional:      Appearance: Normal appearance.  HENT:     Head: Normocephalic and atraumatic.   Eyes:     Extraocular Movements: Extraocular movements intact.    Cardiovascular:     Rate and Rhythm: Normal rate and regular rhythm.     Heart sounds: Normal heart sounds.  Pulmonary:     Effort: Pulmonary effort is normal.     Breath sounds: Normal  breath sounds.   Musculoskeletal:     Cervical back: Normal range of motion.   Skin:    General: Skin is warm.   Neurological:     General: No focal deficit present.     Mental Status: She is alert.   Psychiatric:        Mood and Affect: Mood normal.        Behavior: Behavior normal.      Assessment And Plan:  Type 2 diabetes mellitus with stage 2 chronic kidney disease, without long-term current use of insulin (HCC) Assessment & Plan: Chronic, she has been able to control DM without meds. She will f/u in four months.  I will check an A1c today. She is encouraged to follow her regular exercise routine.  - Reminded to stay well hydrated to address CKD  Orders: -     Hemoglobin A1c -     Lipid panel -     BMP8+EGFR  Parenchymal renal hypertension, stage 1 through stage 4 or unspecified chronic kidney disease Assessment & Plan: Chronic, well controlled.  She will continue with telmisartan  20mg  daily for now.  - She is reminded to follow low sodium diet.   Orders: -     CBC -     Lipid panel  Pure hypercholesterolemia Assessment & Plan: Chronic, LDL was above goal at her last visit.  She has been taking pravastatin  to 40mg  daily M-F.  - Resume regular exercise.   Orders: -      Lipid panel -     ALT   Return for controlled DM check-4 months.  Patient was given opportunity to ask questions. Patient verbalized understanding of the plan and was able to repeat key elements of the plan. All questions were answered to their satisfaction.    I, Ashley LOISE Slocumb, MD, have reviewed all documentation for this visit. The documentation on 01/28/24 for the exam, diagnosis, procedures, and orders are all accurate and complete.   IF YOU HAVE BEEN REFERRED TO A SPECIALIST, IT MAY TAKE 1-2 WEEKS TO SCHEDULE/PROCESS THE REFERRAL. IF YOU HAVE NOT HEARD FROM US /SPECIALIST IN TWO WEEKS, PLEASE GIVE US  A CALL AT [phone].

## 2024-01-29 LAB — LIPID PANEL
Chol/HDL Ratio: 2.9 ratio (ref 0.0–4.4)
Cholesterol, Total: 205 mg/dL — ABNORMAL HIGH (ref 100–199)
HDL: 70 mg/dL (ref >39)
LDL Chol Calc (NIH): 124 mg/dL — ABNORMAL HIGH (ref 0–99)
Triglycerides: 62 mg/dL (ref 0–149)
VLDL Cholesterol Cal: 11 mg/dL (ref 5–40)

## 2024-01-29 LAB — BMP8+EGFR
BUN/Creatinine Ratio: 17 (ref 12–28)
BUN: 14 mg/dL (ref 8–27)
CO2: 22 mmol/L (ref 20–29)
Calcium: 9.1 mg/dL (ref 8.7–10.3)
Chloride: 102 mmol/L (ref 96–106)
Creatinine, Ser: 0.81 mg/dL (ref 0.57–1.00)
Glucose: 101 mg/dL — ABNORMAL HIGH (ref 70–99)
Potassium: 4.2 mmol/L (ref 3.5–5.2)
Sodium: 138 mmol/L (ref 134–144)
eGFR: 71 mL/min/{1.73_m2} (ref >59)

## 2024-01-29 LAB — CBC
Hematocrit: 37.4 % (ref 34.0–46.6)
Hemoglobin: 12.4 g/dL (ref 11.1–15.9)
MCH: 34.3 pg — ABNORMAL HIGH (ref 26.6–33.0)
MCHC: 33.2 g/dL (ref 31.5–35.7)
MCV: 103 fL — ABNORMAL HIGH (ref 79–97)
Platelets: 262 10*3/uL (ref 150–450)
RBC: 3.62 x10E6/uL — ABNORMAL LOW (ref 3.77–5.28)
RDW: 13.1 % (ref 11.7–15.4)
WBC: 2.8 10*3/uL — ABNORMAL LOW (ref 3.4–10.8)

## 2024-01-29 LAB — ALT: ALT: 9 IU/L (ref 0–32)

## 2024-01-29 LAB — HEMOGLOBIN A1C
Est. average glucose Bld gHb Est-mCnc: 140 mg/dL
Hgb A1c MFr Bld: 6.5 % — ABNORMAL HIGH (ref 4.8–5.6)

## 2024-02-03 ENCOUNTER — Ambulatory Visit: Payer: Self-pay | Admitting: Internal Medicine

## 2024-02-03 DIAGNOSIS — R718 Other abnormality of red blood cells: Secondary | ICD-10-CM

## 2024-02-05 ENCOUNTER — Other Ambulatory Visit

## 2024-02-05 DIAGNOSIS — R718 Other abnormality of red blood cells: Secondary | ICD-10-CM

## 2024-02-06 LAB — VITAMIN B12: Vitamin B-12: >2000 pg/mL — ABNORMAL HIGH (ref 232–1245)

## 2024-02-06 LAB — FOLATE: Folate: >20 ng/mL (ref >3.0)

## 2024-02-08 ENCOUNTER — Ambulatory Visit: Payer: Self-pay | Admitting: Internal Medicine

## 2024-03-11 ENCOUNTER — Ambulatory Visit: Payer: Self-pay | Admitting: Internal Medicine

## 2024-03-11 ENCOUNTER — Ambulatory Visit: Payer: Medicare Other

## 2024-03-11 VITALS — BP 128/64 | HR 54 | Temp 97.8°F | Ht 62.0 in | Wt 132.2 lb

## 2024-03-11 DIAGNOSIS — Z0000 Encounter for general adult medical examination without abnormal findings: Secondary | ICD-10-CM

## 2024-03-11 NOTE — Progress Notes (Signed)
 Subjective:   Ashley Lyons is a 83 y.o. who presents for a Medicare Wellness preventive visit.  As a reminder, Annual Wellness Visits don't include a physical exam, and some assessments may be limited, especially if this visit is performed virtually. We may recommend an in-person follow-up visit with your provider if needed.  Visit Complete: In person    Persons Participating in Visit: Patient.  AWV Questionnaire: Yes: Patient Medicare AWV questionnaire was completed by the patient on 03/07/2024; I have confirmed that all information answered by patient is correct and no changes since this date.  Cardiac Risk Factors include: advanced age (>58men, >49 women);diabetes mellitus     Objective:    Today's Vitals   03/11/24 1008  BP: 128/64  Pulse: (!) 54  Temp: 97.8 F (36.6 C)  TempSrc: Oral  SpO2: 98%  Weight: 132 lb 3.2 oz (60 kg)  Height: 5' 2 (1.575 m)   Body mass index is 24.18 kg/m.     03/11/2024   10:13 AM 03/06/2023    9:56 AM 05/23/2022    8:38 AM 04/26/2021    8:51 AM 04/20/2020    8:54 AM 04/15/2019    8:50 AM  Advanced Directives  Does Patient Have a Medical Advance Directive? Yes Yes Yes Yes Yes Yes  Type of Estate agent of [place];Living will Healthcare Power of [place];Living will Healthcare Power of [place];Living will Healthcare Power of [place];Living will Healthcare Power of [place];Living will Healthcare Power of [place];Living will  Copy of Healthcare Power of Attorney in Chart? No - copy requested No - copy requested No - copy requested No - copy requested No - copy requested No - copy requested    Current Medications (verified) Outpatient Encounter Medications as of 03/11/2024  Medication Sig   Barberry-Oreg Grape-Goldenseal (BERBERINE COMPLEX PO) Take 500 mg by mouth.   BIOTIN PO Take 100 mg by mouth.    CALCIUM PO Take 1,000 mg by mouth.    Cholecalciferol (VITAMIN D3) 50 MCG (2000 UT) capsule Take 2,000 Units by mouth  daily.   co-enzyme Q-10 30 MG capsule Take 30 mg by mouth daily.    DiphenhydrAMINE HCl (BENADRYL ALLERGY PO) Take 25 mg by mouth as needed.   hydrocortisone 2.5 % cream Apply to face twice daily for 2 wks, then daily for 2 wks, then daily as needed for itching   latanoprost (XALATAN) 0.005 % ophthalmic solution Place 1 drop into the left eye at bedtime.   Multiple Vitamin (MULTIVITAMIN) tablet Take 1 tablet by mouth daily.   pravastatin  (PRAVACHOL ) 40 MG tablet One tab po daily MON, TUES, WED, THURS, FRI skip SAT & SUN.   RESTASIS 0.05 % ophthalmic emulsion 1 drop 2 (two) times daily.   SELENIUM PO Take by mouth. 200 mg   SPIRULINA PO Take by mouth. As needed   telmisartan  (MICARDIS ) 20 MG tablet Take 1 tablet by mouth once daily   timolol (TIMOPTIC) 0.5 % ophthalmic solution    vitamin C (ASCORBIC ACID) 500 MG tablet Take 500 mg by mouth daily.   No facility-administered encounter medications on file as of 03/11/2024.    Allergies (verified) Almond oil, Bean pod extract, Corn-containing products, Peanut-containing drug products, Peanuts [peanut oil], Sodium laureth sulfate, Soy allergy (obsolete), Strawberry extract, and Tomato   History: Past Medical History:  Diagnosis Date   Allergy    Chronic kidney disease    Diabetes mellitus    GERD (gastroesophageal reflux disease)    Glaucoma  right eye   Hyperlipidemia    Leukopenia    Primary hypertension 07/31/2022   Past Surgical History:  Procedure Laterality Date   APPENDECTOMY     COLONOSCOPY     EYE SURGERY     TONSILLECTOMY     TUBAL LIGATION     VAGINAL HYSTERECTOMY     Family History  Problem Relation Age of Onset   Prostate cancer Father    Cancer Father    Heart disease Father    Hypertension Father    Diabetes Mother    Prostate cancer Brother    Cancer Brother    Kidney disease Brother    Kidney disease Sister    Depression Sister    Diabetes Sister    Liver disease Brother    Colon cancer Brother     Esophageal cancer Neg Hx    Rectal cancer Neg Hx    Stomach cancer Neg Hx    Social History   Socioeconomic History   Marital status: Widowed    Spouse name: Not on file   Number of children: Not on file   Years of education: Not on file   Highest education level: Master's degree (e.g., MA, MS, MEng, MEd, MSW, MBA)  Occupational History   Occupation: retired  Tobacco Use   Smoking status: Never   Smokeless tobacco: Never  Vaping Use   Vaping status: Never Used  Substance and Sexual Activity   Alcohol use: Not Currently    Comment: wine on occasion   Drug use: No   Sexual activity: Not Currently  Other Topics Concern   Not on file  Social History Narrative   Not on file   Social Drivers of Health   Financial Resource Strain: Low Risk  (03/11/2024)   Overall Financial Resource Strain (CARDIA)    Difficulty of Paying Living Expenses: Not hard at all  Food Insecurity: No Food Insecurity (03/11/2024)   Hunger Vital Sign    Worried About Running Out of Food in the Last Year: Never true    Ran Out of Food in the Last Year: Never true  Transportation Needs: No Transportation Needs (03/11/2024)   PRAPARE - Administrator, Civil Service (Medical): No    Lack of Transportation (Non-Medical): No  Physical Activity: Inactive (03/11/2024)   Exercise Vital Sign    Days of Exercise per Week: 0 days    Minutes of Exercise per Session: 0 min  Stress: No Stress Concern Present (03/11/2024)   [employer] of Occupational Health - Occupational Stress Questionnaire    Feeling of Stress: Only a little  Social Connections: Moderately Integrated (03/11/2024)   Social Connection and Isolation Panel    Frequency of Communication with Friends and Family: More than three times a week    Frequency of Social Gatherings with Friends and Family: More than three times a week    Attends Religious Services: More than 4 times per year    Active Member of [employer] or Organizations: Yes    Attends  Banker Meetings: More than 4 times per year    Marital Status: Widowed    Tobacco Counseling Counseling given: Not Answered    Clinical Intake:  Pre-visit preparation completed: Yes  Pain : No/denies pain     Nutritional Status: BMI of 19-24  Normal Nutritional Risks: None Diabetes: Yes CBG done?: No Did pt. bring in CBG monitor from home?: No  Lab Results  Component Value Date   HGBA1C 6.5 (H)  01/28/2024   HGBA1C 6.7 (H) 10/28/2023   HGBA1C 6.7 (H) 06/18/2023     How often do you need to have someone help you when you read instructions, pamphlets, or other written materials from your doctor or pharmacy?: 1 - Never  Interpreter Needed?: No  Information entered by :: NAllen LPN   Activities of Daily Living     03/07/2024    2:09 PM  In your present state of health, do you have any difficulty performing the following activities:  Hearing? 0  Vision? 0  Difficulty concentrating or making decisions? 0  Walking or climbing stairs? 0  Dressing or bathing? 0  Doing errands, shopping? 0  Preparing Food and eating ? N  Using the Toilet? N  In the past six months, have you accidently leaked urine? N  Do you have problems with loss of bowel control? N  Managing your Medications? N  Managing your Finances? N  Housekeeping or managing your Housekeeping? N    Patient Care Team: Jarold Medici, MD as PCP - General (Internal Medicine) Raford Riggs, MD as PCP - Cardiology (Cardiology) Kennyth Cy RAMAN, DO (Osteopathic Medicine)  I have updated your Care Teams any recent Medical Services you may have received from other providers in the past year.     Assessment:   This is a routine wellness examination for Ashley Lyons.  Hearing/Vision screen Hearing Screening - Comments:: Denies hearing issues Vision Screening - Comments:: Regular eye exams, [facility], Dr. Kennyth   Goals Addressed             This Visit's Progress    Patient Stated        03/11/2024, stay alert       Depression Screen     03/11/2024   10:16 AM 01/28/2024    2:44 PM 10/28/2023   12:14 PM 03/06/2023    9:58 AM 03/06/2023    9:48 AM 05/23/2022    8:39 AM 04/26/2021    8:52 AM  PHQ 2/9 Scores  PHQ - 2 Score 0 0 0 0 0 0 0  PHQ- 9 Score 1 0 0 0 0      Fall Risk     03/07/2024    2:09 PM 01/28/2024    2:44 PM 10/28/2023   12:14 PM 06/18/2023   11:20 AM 03/06/2023    9:48 AM  Fall Risk   Falls in the past year? 0 0 0 0 0  Number falls in past yr: 0 0 0 0 0  Injury with Fall? 0 0 0 0 0  Risk for fall due to : Medication side effect No Fall Risks No Fall Risks No Fall Risks No Fall Risks  Follow up Falls prevention discussed;Falls evaluation completed Falls evaluation completed Falls evaluation completed Falls evaluation completed Falls evaluation completed    MEDICARE RISK AT HOME:  Medicare Risk at Home Any stairs in or around the home?: (Patient-Rptd) Yes If so, are there any without handrails?: (Patient-Rptd) No Home free of loose throw rugs in walkways, pet beds, electrical cords, etc?: (Patient-Rptd) Yes Adequate lighting in your home to reduce risk of falls?: (Patient-Rptd) Yes Life alert?: (Patient-Rptd) No Use of a cane, walker or w/c?: (Patient-Rptd) No Grab bars in the bathroom?: (Patient-Rptd) Yes Shower chair or bench in shower?: (Patient-Rptd) Yes Elevated toilet seat or a handicapped toilet?: (Patient-Rptd) No  TIMED UP AND GO:  Was the test performed?  Yes  Length of time to ambulate 10 feet: 5 sec Gait  steady and fast without use of assistive device  Cognitive Function: 6CIT completed        03/11/2024   10:20 AM 03/06/2023    9:58 AM 05/23/2022    8:40 AM 04/26/2021    8:54 AM 04/20/2020    8:57 AM  6CIT Screen  What Year? 0 points 0 points 0 points 0 points 0 points  What month? 0 points 0 points 0 points 0 points 0 points  What time? 0 points 3 points 0 points 0 points 0 points  Count back from 20 0 points 0 points 0  points 0 points 2 points  Months in reverse 0 points 0 points 0 points 0 points 0 points  Repeat phrase 0 points 2 points 0 points 2 points 2 points  Total Score 0 points 5 points 0 points 2 points 4 points    Immunizations Immunization History  Administered Date(s) Administered    sv, Bivalent, Protein Subunit Rsvpref,pf (Abrysvo) 06/11/2022   DTaP 10/27/2012   Fluad Quad(high Dose 65+) 04/20/2020, 04/26/2021, 05/23/2022   Fluad Trivalent(High Dose 65+) 10/28/2023   Influenza, High Dose Seasonal PF 05/05/2018, 04/15/2019   Influenza-Unspecified 05/15/2018   PFIZER(Purple Top)SARS-COV-2 Vaccination 10/17/2019, 11/10/2019, 07/12/2020, 12/30/2020, 06/11/2022   Pneumococcal Conjugate-13 07/11/2016   Pneumococcal Polysaccharide-23 10/27/2010, 01/18/2022   Pneumococcal-Unspecified 07/21/2016   Tdap 01/01/2023   Zoster Recombinant(Shingrix ) 06/21/2021, 10/09/2021, 12/25/2021    Screening Tests Health Maintenance  Topic Date Due   COVID-19 Vaccine (6 - 2024-25 season) 04/07/2023   INFLUENZA VACCINE  03/06/2024   Diabetic kidney evaluation - Urine ACR  06/17/2024   FOOT EXAM  06/17/2024   HEMOGLOBIN A1C  07/29/2024   OPHTHALMOLOGY EXAM  10/01/2024   Diabetic kidney evaluation - eGFR measurement  01/27/2025   Medicare Annual Wellness (AWV)  03/11/2025   DTaP/Tdap/Td (3 - Td or Tdap) 12/31/2032   Pneumococcal Vaccine: 50+ Years  Completed   DEXA SCAN  Completed   Zoster Vaccines- Shingrix   Completed   Hepatitis B Vaccines  Aged Out   HPV VACCINES  Aged Out   Meningococcal B Vaccine  Aged Out    Health Maintenance  Health Maintenance Due  Topic Date Due   COVID-19 Vaccine (6 - 2024-25 season) 04/07/2023   INFLUENZA VACCINE  03/06/2024   Health Maintenance Items Addressed: Will get flu and covid in the fall.  Additional Screening:  Vision Screening: Recommended annual ophthalmology exams for early detection of glaucoma and other disorders of the eye. Would you like a  referral to an eye doctor? No    Dental Screening: Recommended annual dental exams for proper oral hygiene  Community Resource Referral / Chronic Care Management: CRR required this visit?  No   CCM required this visit?  No   Plan:    I have personally reviewed and noted the following in the patient's chart:   Medical and social history Use of alcohol, tobacco or illicit drugs  Current medications and supplements including opioid prescriptions. Patient is not currently taking opioid prescriptions. Functional ability and status Nutritional status Physical activity Advanced directives List of other physicians Hospitalizations, surgeries, and ER visits in previous 12 months Vitals Screenings to include cognitive, depression, and falls Referrals and appointments  In addition, I have reviewed and discussed with patient certain preventive protocols, quality metrics, and best practice recommendations. A written personalized care plan for preventive services as well as general preventive health recommendations were provided to patient.   Ashley Lyons Dawn, LPN   08/08/7972   After  Visit Summary: (In Person-Declined) Patient declined AVS at this time.  Notes: Nothing significant to report at this time.

## 2024-03-11 NOTE — Patient Instructions (Signed)
 Ms. Borgen , Thank you for taking time out of your busy schedule to complete your Annual Wellness Visit with me. I enjoyed our conversation and look forward to speaking with you again next year. I, as well as your care team,  appreciate your ongoing commitment to your health goals. Please review the following plan we discussed and let me know if I can assist you in the future. Your Game plan/ To Do List    Referrals: If you haven't heard from the office you've been referred to, please reach out to them at the phone provided.   Follow up Visits: We will see or speak with you next year for your Next Medicare AWV with our clinical staff Have you seen your provider in the last 6 months (3 months if uncontrolled diabetes)? Yes  Clinician Recommendations:  Aim for 30 minutes of exercise or brisk walking, 6-8 glasses of water, and 5 servings of fruits and vegetables each day.       This is a list of the screenings recommended for you:  Health Maintenance  Topic Date Due   COVID-19 Vaccine (6 - 2024-25 season) 04/07/2023   Flu Shot  03/06/2024   Yearly kidney health urinalysis for diabetes  06/17/2024   Complete foot exam   06/17/2024   Hemoglobin A1C  07/29/2024   Eye exam for diabetics  10/01/2024   Yearly kidney function blood test for diabetes  01/27/2025   Medicare Annual Wellness Visit  03/11/2025   DTaP/Tdap/Td vaccine (3 - Td or Tdap) 12/31/2032   Pneumococcal Vaccine for age over 27  Completed   DEXA scan (bone density measurement)  Completed   Zoster (Shingles) Vaccine  Completed   Hepatitis B Vaccine  Aged Out   HPV Vaccine  Aged Out   Meningitis B Vaccine  Aged Out    Advanced directives: (Copy Requested) Please bring a copy of your health care power of attorney and living will to the office to be added to your chart at your convenience. You can mail to [facility] [address] [place], [place] [address] or email to [email or web address] Advance Care  Planning is important because it:  [x]  Makes sure you receive the medical care that is consistent with your values, goals, and preferences  [x]  It provides guidance to your family and loved ones and reduces their decisional burden about whether or not they are making the right decisions based on your wishes.  Follow the link provided in your after visit summary or read over the paperwork we have mailed to you to help you started getting your Advance Directives in place. If you need assistance in completing these, please reach out to us  so that we can help you!  See attachments for Preventive Care and Fall Prevention Tips.

## 2024-03-25 ENCOUNTER — Other Ambulatory Visit: Payer: Self-pay | Admitting: Internal Medicine

## 2024-04-15 DIAGNOSIS — H04123 Dry eye syndrome of bilateral lacrimal glands: Secondary | ICD-10-CM | POA: Diagnosis not present

## 2024-04-15 DIAGNOSIS — H401111 Primary open-angle glaucoma, right eye, mild stage: Secondary | ICD-10-CM | POA: Diagnosis not present

## 2024-04-15 DIAGNOSIS — H401122 Primary open-angle glaucoma, left eye, moderate stage: Secondary | ICD-10-CM | POA: Diagnosis not present

## 2024-04-15 DIAGNOSIS — E119 Type 2 diabetes mellitus without complications: Secondary | ICD-10-CM | POA: Diagnosis not present

## 2024-06-02 ENCOUNTER — Encounter: Payer: Self-pay | Admitting: Internal Medicine

## 2024-06-02 ENCOUNTER — Ambulatory Visit (INDEPENDENT_AMBULATORY_CARE_PROVIDER_SITE_OTHER): Admitting: Internal Medicine

## 2024-06-02 VITALS — BP 120/70 | HR 88 | Temp 98.1°F | Ht 62.0 in | Wt 132.0 lb

## 2024-06-02 DIAGNOSIS — E1122 Type 2 diabetes mellitus with diabetic chronic kidney disease: Secondary | ICD-10-CM | POA: Diagnosis not present

## 2024-06-02 DIAGNOSIS — M85859 Other specified disorders of bone density and structure, unspecified thigh: Secondary | ICD-10-CM

## 2024-06-02 DIAGNOSIS — I129 Hypertensive chronic kidney disease with stage 1 through stage 4 chronic kidney disease, or unspecified chronic kidney disease: Secondary | ICD-10-CM

## 2024-06-02 DIAGNOSIS — N182 Chronic kidney disease, stage 2 (mild): Secondary | ICD-10-CM | POA: Diagnosis not present

## 2024-06-02 DIAGNOSIS — E7800 Pure hypercholesterolemia, unspecified: Secondary | ICD-10-CM | POA: Diagnosis not present

## 2024-06-02 DIAGNOSIS — M79641 Pain in right hand: Secondary | ICD-10-CM

## 2024-06-02 MED ORDER — TELMISARTAN 20 MG PO TABS: 20.0000 mg | ORAL_TABLET | Freq: Every day | ORAL | 2 refills | Status: AC

## 2024-06-02 NOTE — Assessment & Plan Note (Signed)
 Cholesterol levels remain elevated. She is on pravastatin  40 mg but experiences leg cramps at this dose. Previous recommendation to increase to 80 mg was not followed due to cramps. Discussed importance of lowering LDL cholesterol to <70 mg/dL. Consider referral to lipid specialist after next visit. - Recheck cholesterol at next visit. - Consider referral to lipid specialist after next visit.

## 2024-06-02 NOTE — Patient Instructions (Signed)

## 2024-06-02 NOTE — Progress Notes (Signed)
 I,Jameka J Llittleton, CMA,acting as a neurosurgeon for Ashley LOISE Slocumb, MD.,have documented all relevant documentation on the behalf of Ashley LOISE Slocumb, MD,as directed by  Ashley LOISE Slocumb, MD while in the presence of Ashley LOISE Slocumb, MD.  Subjective:  Patient ID: Ashley Lyons , female    DOB: 1941/02/02 , 83 y.o.   MRN: [id number]  Chief Complaint  Patient presents with   Diabetes    Patient presents today for a diabetes check. Patient reports compliance with her meds. Patient denies having chest pain,sob or headaches at this time.     HPI Discussed the use of AI scribe software for clinical note transcription with the patient, who gave verbal consent to proceed.  History of Present Illness Ashley Lyons is an 83 year old female who presents for a routine diabetes check.  She feels generally well without major complaints. Her current medications include olmesartan 20 mg daily, pravastatin  40 mg, and Xalatan (latanoprost) eye drops. She occasionally takes spirulina and selenium, though not regularly. She has not increased her pravastatin  dose to 80 mg due to leg cramps experienced at the 40 mg dose.  She has a history of hyperlipidemia, with elevated cholesterol noted at her last visit.  She experiences osteoarthritis symptoms, particularly in her hands, which she attributes to frequent use, including a sign language course. The aches are more pronounced at night. She has a past history of carpal tunnel syndrome but currently attributes her symptoms to arthritis.  She is actively engaged in various activities, including working as a it consultant, applying for another job, and staying physically active through household chores and gardening. She also takes her great-grandson to [employer] [place] [employer] twice a week. She enjoys reading, editing, and staying informed about the news.  She has received her flu shot and a COVID booster. She has a preference for [employer] ice cream but limits herself to  single-serving cups. She is aware of her osteopenia. Her thyroid  was checked in March, and no further testing is required at this time.   Diabetes She presents for her follow-up diabetic visit. She has type 2 diabetes mellitus. Her disease course has been stable. There are no hypoglycemic associated symptoms. Pertinent negatives for diabetes include no polydipsia, no polyphagia and no polyuria. There are no hypoglycemic complications. Risk factors for coronary artery disease include dyslipidemia, diabetes mellitus, sedentary lifestyle and post-menopausal. She is following a generally healthy diet. She participates in exercise intermittently. There is no change in her home blood glucose trend. Her breakfast blood glucose is taken between 7-8 am. Her breakfast blood glucose range is generally 90-110 mg/dl. An ACE inhibitor/angiotensin II receptor blocker is not being taken. Eye exam is current.  Hyperlipidemia This is a chronic problem. The problem is controlled. Exacerbating diseases include diabetes. Current antihyperlipidemic treatment includes statins and diet change. The current treatment provides moderate improvement of lipids. Risk factors for coronary artery disease include diabetes mellitus, dyslipidemia and post-menopausal.     Past Medical History:  Diagnosis Date   Allergy    Chronic kidney disease    Diabetes mellitus    GERD (gastroesophageal reflux disease)    Glaucoma    right eye   Hyperlipidemia    Leukopenia    Primary hypertension 07/31/2022     Family History  Problem Relation Age of Onset   Prostate cancer Father    Cancer Father    Heart disease Father    Hypertension Father    Diabetes Mother  Prostate cancer Brother    Cancer Brother    Kidney disease Brother    Kidney disease Sister    Depression Sister    Diabetes Sister    Liver disease Brother    Colon cancer Brother    Esophageal cancer Neg Hx    Rectal cancer Neg Hx    Stomach cancer Neg Hx       Current Outpatient Medications:    Barberry-Oreg Grape-Goldenseal (BERBERINE COMPLEX PO), Take 500 mg by mouth., Disp: , Rfl:    BIOTIN PO, Take 100 mg by mouth. , Disp: , Rfl:    CALCIUM PO, Take 1,000 mg by mouth. , Disp: , Rfl:    Cholecalciferol (VITAMIN D3) 50 MCG (2000 UT) capsule, Take 2,000 Units by mouth daily., Disp: , Rfl:    co-enzyme Q-10 30 MG capsule, Take 30 mg by mouth daily. , Disp: , Rfl:    DiphenhydrAMINE HCl (BENADRYL ALLERGY PO), Take 25 mg by mouth as needed., Disp: , Rfl:    hydrocortisone 2.5 % cream, Apply to face twice daily for 2 wks, then daily for 2 wks, then daily as needed for itching, Disp: , Rfl:    latanoprost (XALATAN) 0.005 % ophthalmic solution, Place 1 drop into the left eye at bedtime., Disp: , Rfl:    Multiple Vitamin (MULTIVITAMIN) tablet, Take 1 tablet by mouth daily., Disp: , Rfl:    pravastatin  (PRAVACHOL ) 40 MG tablet, One tab po daily MON, TUES, WED, THURS, FRI skip SAT & SUN., Disp: 75 tablet, Rfl: 2   SPIRULINA PO, Take by mouth. As needed, Disp: , Rfl:    timolol (TIMOPTIC) 0.5 % ophthalmic solution, , Disp: , Rfl:    vitamin C (ASCORBIC ACID) 500 MG tablet, Take 500 mg by mouth daily., Disp: , Rfl:    telmisartan  (MICARDIS ) 20 MG tablet, Take 1 tablet (20 mg total) by mouth daily., Disp: 90 tablet, Rfl: 2   Allergies  Allergen Reactions   Almond Oil Hives    Almonds   Bean Pod Extract Hives    String Beans   Corn-Containing Products Hives   Peanut-Containing Drug Products Hives   Peanuts [Peanut Oil] Hives   Sodium Laureth Sulfate Hives   Soy Allergy (Obsolete) Hives   Strawberry Extract Hives   Tomato Hives     Review of Systems  Constitutional: Negative.   Eyes: Negative.   Respiratory: Negative.    Cardiovascular: Negative.   Endocrine: Negative for polydipsia, polyphagia and polyuria.  Musculoskeletal:  Positive for arthralgias.       She c/o right hand pain. Denies recent fall/trauma. She is right handed.  There is  stiffness upon awakening. At times, has difficulty making a fist.   Skin: Negative.   Psychiatric/Behavioral: Negative.       Today's Vitals   06/02/24 1147  BP: 120/70  Pulse: 88  Temp: 98.1 F (36.7 C)  TempSrc: Oral  Weight: 132 lb (59.9 kg)  Height: 5' 2 (1.575 m)  PainSc: 0-No pain   Body mass index is 24.14 kg/m.  Wt Readings from Last 3 Encounters:  06/02/24 132 lb (59.9 kg)  03/11/24 132 lb 3.2 oz (60 kg)  01/28/24 135 lb (61.2 kg)    The ASCVD Risk score (Arnett DK, et al., 2019) failed to calculate for the following reasons:   The 2019 ASCVD risk score is only valid for ages 74 to 8  Objective:  Physical Exam Vitals and nursing note reviewed.  Constitutional:  Appearance: Normal appearance.  HENT:     Head: Normocephalic and atraumatic.  Eyes:     Extraocular Movements: Extraocular movements intact.  Cardiovascular:     Rate and Rhythm: Normal rate and regular rhythm.     Heart sounds: Normal heart sounds.  Pulmonary:     Effort: Pulmonary effort is normal.     Breath sounds: Normal breath sounds.  Musculoskeletal:     Cervical back: Normal range of motion.     Comments: Pos squeeze test on the right  Skin:    General: Skin is warm.  Neurological:     General: No focal deficit present.     Mental Status: She is alert.  Psychiatric:        Mood and Affect: Mood normal.        Behavior: Behavior normal.         Assessment And Plan:  Type 2 diabetes mellitus with stage 2 chronic kidney disease, without long-term current use of insulin (HCC) Assessment & Plan: Chronic, she has been able to control DM without meds. She will f/u in four months.  I will check an A1c today. She is encouraged to follow her regular exercise routine.  - Reminded to stay well hydrated to address CKD  Orders: -     CMP14+EGFR -     Hemoglobin A1c  Parenchymal renal hypertension, stage 1 through stage 4 or unspecified chronic kidney disease Assessment &  Plan: Chronic, well controlled.  She will continue with telmisartan  20mg  daily for now.  - She is reminded to follow low sodium diet.    Pure hypercholesterolemia Assessment & Plan: Cholesterol levels remain elevated. She is on pravastatin  40 mg but experiences leg cramps at this dose. Previous recommendation to increase to 80 mg was not followed due to cramps. Discussed importance of lowering LDL cholesterol to <70 mg/dL. Consider referral to lipid specialist after next visit. - Recheck cholesterol at next visit. - Consider referral to lipid specialist after next visit.  Orders: -     CMP14+EGFR  Osteopenia of neck of femur, unspecified laterality -     VITAMIN D  25 Hydroxy (Vit-D Deficiency, Fractures)  Right hand pain -     ANA, IFA (with reflex) -     CYCLIC CITRUL PEPTIDE ANTIBODY, IGG/IGA -     Rheumatoid factor -     Sedimentation rate -     Uric acid  Other orders -     Telmisartan ; Take 1 tablet (20 mg total) by mouth daily.  Dispense: 90 tablet; Refill: 2   Assessment & Plan Hypertension She is on olmesartan 20 mg daily with no refills remaining. - Refill olmesartan prescription.  Osteoarthritis Reports localized hand pain, particularly in the right hand, suggesting arthritis rather than carpal tunnel syndrome. An arthritis panel will be checked to confirm diagnosis. - Order arthritis panel.  Osteopenia Osteopenia present. Discussed need for bone density test next year. Vitamin D  levels will be checked to ensure adequacy. - Check vitamin D  levels. - Plan for bone density test next year.  General Health Maintenance Received flu shot and COVID booster at [employer]. No thyroid  issues, so selenium supplementation is not needed. Discussed sporadic exercise and importance of staying active. Engages in activities contributing to cognitive health. - Ensure flu shot and COVID booster are documented. Return for controlled DM check-4 months.  Patient was given  opportunity to ask questions. Patient verbalized understanding of the plan and was able to repeat key elements of  the plan. All questions were answered to their satisfaction.   I, Ashley LOISE Slocumb, MD, have reviewed all documentation for this visit. The documentation on 06/02/24 for the exam, diagnosis, procedures, and orders are all accurate and complete.   IF YOU HAVE BEEN REFERRED TO A SPECIALIST, IT MAY TAKE 1-2 WEEKS TO SCHEDULE/PROCESS THE REFERRAL. IF YOU HAVE NOT HEARD FROM US /SPECIALIST IN TWO WEEKS, PLEASE GIVE US  A CALL AT [phone].

## 2024-06-02 NOTE — Assessment & Plan Note (Signed)
 Chronic, well controlled.  She will continue with telmisartan  20mg  daily for now.  - She is reminded to follow low sodium diet.

## 2024-06-02 NOTE — Assessment & Plan Note (Signed)
 Chronic, she has been able to control DM without meds. She will f/u in four months.  I will check an A1c today. She is encouraged to follow her regular exercise routine.  - Reminded to stay well hydrated to address CKD

## 2024-06-03 LAB — HEMOGLOBIN A1C
Est. average glucose Bld gHb Est-mCnc: 143 mg/dL
Hgb A1c MFr Bld: 6.6 % — ABNORMAL HIGH (ref 4.8–5.6)

## 2024-06-03 LAB — ANTINUCLEAR ANTIBODIES, IFA: ANA Titer 1: NEGATIVE

## 2024-06-03 LAB — URIC ACID: Uric Acid: 4.8 mg/dL (ref 3.1–7.9)

## 2024-06-03 LAB — CMP14+EGFR
ALT: 10 IU/L (ref 0–32)
AST: 21 IU/L (ref 0–40)
Albumin: 4.3 g/dL (ref 3.7–4.7)
Alkaline Phosphatase: 53 IU/L (ref 48–129)
BUN/Creatinine Ratio: 14 (ref 12–28)
BUN: 14 mg/dL (ref 8–27)
Bilirubin Total: 0.7 mg/dL (ref 0.0–1.2)
CO2: 23 mmol/L (ref 20–29)
Calcium: 9.6 mg/dL (ref 8.7–10.3)
Chloride: 101 mmol/L (ref 96–106)
Creatinine, Ser: 0.99 mg/dL (ref 0.57–1.00)
Globulin, Total: 3.1 g/dL (ref 1.5–4.5)
Glucose: 117 mg/dL — ABNORMAL HIGH (ref 70–99)
Potassium: 4.7 mmol/L (ref 3.5–5.2)
Sodium: 139 mmol/L (ref 134–144)
Total Protein: 7.4 g/dL (ref 6.0–8.5)
eGFR: 56 mL/min/1.73 — ABNORMAL LOW (ref >59)

## 2024-06-03 LAB — RHEUMATOID FACTOR: Rheumatoid fact SerPl-aCnc: <10 [IU]/mL (ref <14.0)

## 2024-06-03 LAB — VITAMIN D 25 HYDROXY (VIT D DEFICIENCY, FRACTURES): Vit D, 25-Hydroxy: 51.2 ng/mL (ref 30.0–100.0)

## 2024-06-03 LAB — CYCLIC CITRUL PEPTIDE ANTIBODY, IGG/IGA: Cyclic Citrullin Peptide Ab: 9 U (ref 0–19)

## 2024-06-03 LAB — SEDIMENTATION RATE: Sed Rate: 15 mm/h (ref 0–40)

## 2024-06-04 ENCOUNTER — Other Ambulatory Visit

## 2024-06-04 DIAGNOSIS — Z79899 Other long term (current) drug therapy: Secondary | ICD-10-CM

## 2024-06-05 LAB — BMP8+EGFR
BUN/Creatinine Ratio: 15 (ref 12–28)
BUN: 12 mg/dL (ref 8–27)
CO2: 25 mmol/L (ref 20–29)
Calcium: 9.3 mg/dL (ref 8.7–10.3)
Chloride: 103 mmol/L (ref 96–106)
Creatinine, Ser: 0.78 mg/dL (ref 0.57–1.00)
Glucose: 130 mg/dL — ABNORMAL HIGH (ref 70–99)
Potassium: 4.6 mmol/L (ref 3.5–5.2)
Sodium: 141 mmol/L (ref 134–144)
eGFR: 74 mL/min/1.73 (ref >59)

## 2024-06-08 ENCOUNTER — Ambulatory Visit: Payer: Self-pay | Admitting: Internal Medicine

## 2024-06-10 ENCOUNTER — Ambulatory Visit: Payer: Self-pay | Admitting: Internal Medicine

## 2024-06-18 ENCOUNTER — Encounter: Payer: Self-pay | Admitting: Internal Medicine

## 2024-06-18 ENCOUNTER — Ambulatory Visit (INDEPENDENT_AMBULATORY_CARE_PROVIDER_SITE_OTHER): Payer: Self-pay | Admitting: Internal Medicine

## 2024-06-18 VITALS — BP 132/84 | HR 60 | Temp 98.3°F | Ht 62.0 in | Wt 132.4 lb

## 2024-06-18 DIAGNOSIS — N182 Chronic kidney disease, stage 2 (mild): Secondary | ICD-10-CM | POA: Diagnosis not present

## 2024-06-18 DIAGNOSIS — Z0000 Encounter for general adult medical examination without abnormal findings: Secondary | ICD-10-CM | POA: Diagnosis not present

## 2024-06-18 DIAGNOSIS — I129 Hypertensive chronic kidney disease with stage 1 through stage 4 chronic kidney disease, or unspecified chronic kidney disease: Secondary | ICD-10-CM | POA: Diagnosis not present

## 2024-06-18 DIAGNOSIS — E1122 Type 2 diabetes mellitus with diabetic chronic kidney disease: Secondary | ICD-10-CM | POA: Diagnosis not present

## 2024-06-18 DIAGNOSIS — E7800 Pure hypercholesterolemia, unspecified: Secondary | ICD-10-CM

## 2024-06-18 DIAGNOSIS — M85859 Other specified disorders of bone density and structure, unspecified thigh: Secondary | ICD-10-CM

## 2024-06-18 DIAGNOSIS — M85852 Other specified disorders of bone density and structure, left thigh: Secondary | ICD-10-CM

## 2024-06-18 LAB — POCT URINALYSIS DIPSTICK
Bilirubin, UA: NEGATIVE
Blood, UA: NEGATIVE
Glucose, UA: NEGATIVE
Ketones, UA: NEGATIVE
Nitrite, UA: NEGATIVE
Protein, UA: NEGATIVE
Spec Grav, UA: 1.015 (ref 1.010–1.025)
Urobilinogen, UA: 0.2 U/dL
pH, UA: 6 (ref 5.0–8.0)

## 2024-06-18 LAB — LIPID PANEL
Chol/HDL Ratio: 2.6 ratio (ref 0.0–4.4)
Cholesterol, Total: 192 mg/dL (ref 100–199)
HDL: 74 mg/dL (ref >39)
LDL Chol Calc (NIH): 107 mg/dL — ABNORMAL HIGH (ref 0–99)
Triglycerides: 59 mg/dL (ref 0–149)
VLDL Cholesterol Cal: 11 mg/dL (ref 5–40)

## 2024-06-18 LAB — ALT: ALT: 12 IU/L (ref 0–32)

## 2024-06-18 MED ORDER — PRAVASTATIN SODIUM 40 MG PO TABS: ORAL_TABLET | ORAL | 2 refills | Status: DC

## 2024-06-18 MED ORDER — PRAVASTATIN SODIUM 80 MG PO TABS: ORAL_TABLET | ORAL | 2 refills | Status: AC

## 2024-06-18 NOTE — Patient Instructions (Signed)

## 2024-06-18 NOTE — Progress Notes (Signed)
 I,Victoria T Emmitt, CMA,acting as a neurosurgeon for Ashley LOISE Slocumb, MD.,have documented all relevant documentation on the behalf of Ashley LOISE Slocumb, MD,as directed by  Ashley LOISE Slocumb, MD while in the presence of Ashley LOISE Slocumb, MD.  Subjective:    Patient ID: Ashley Lyons , female    DOB: 1941-06-14 , 83 y.o.   MRN: [id number]  Chief Complaint  Patient presents with   Annual Exam    She is here today for full physical examination. She is no longer followed by GYN. She reports compliance with meds. She denies headaches, chest pain and shortness of breath.     Diabetes   Hypertension   Hyperlipidemia    HPI Discussed the use of AI scribe software for clinical note transcription with the patient, who gave verbal consent to proceed.  History of Present Illness Ashley Lyons is an 83 year old female with diabetes and hyperlipidemia who presents for an annual physical exam.  She has been taking pravastatin  80 mg Monday through Friday and is due for a refill. Her cholesterol was checked less than six months ago, and she also had an A1c test done recently. She does not consume a lot of butter or fast food and includes fish in her diet. Exercise is a significant challenge for her.  She is currently taking telmisartan . Her kidneys and liver were checked in October, and she is scheduled for a urine test today. Her bowel movements are regular, occurring once or twice daily, and she maintains adequate water intake, recognizing when she feels dehydrated.  She discusses her perfectionist tendencies, stating that she cannot leave the house unless everything is in order. She has attended classes on perfectionism and was informed that she has obsessive-compulsive personality traits. She shares a dream about being a perfectionist and the need to take better care of herself.   Diabetes She presents for her follow-up diabetic visit. She has type 2 diabetes mellitus. There are no hypoglycemic associated  symptoms. Pertinent negatives for diabetes include no blurred vision and no chest pain. There are no hypoglycemic complications. Risk factors for coronary artery disease include diabetes mellitus, dyslipidemia, sedentary lifestyle and post-menopausal. Current diabetic treatment includes diet. She is compliant with treatment most of the time. She is following a diabetic diet. She participates in exercise intermittently. An ACE inhibitor/angiotensin II receptor blocker is not being taken. Eye exam is current.  Hyperlipidemia This is a chronic problem. The current episode started more than 1 year ago. The problem is controlled. Exacerbating diseases include diabetes. Pertinent negatives include no chest pain. Current antihyperlipidemic treatment includes diet change and statins. The current treatment provides moderate improvement of lipids. Risk factors for coronary artery disease include diabetes mellitus, dyslipidemia, post-menopausal and hypertension.     Past Medical History:  Diagnosis Date   Allergy    Chronic kidney disease    Diabetes mellitus    GERD (gastroesophageal reflux disease)    Glaucoma    right eye   Hyperlipidemia    Leukopenia    Primary hypertension 07/31/2022     Family History  Problem Relation Age of Onset   Prostate cancer Father    Cancer Father    Heart disease Father    Hypertension Father    Diabetes Mother    Prostate cancer Brother    Cancer Brother    Kidney disease Brother    Kidney disease Sister    Depression Sister    Diabetes Sister    Liver  disease Brother    Colon cancer Brother    Esophageal cancer Neg Hx    Rectal cancer Neg Hx    Stomach cancer Neg Hx      Current Outpatient Medications:    Barberry-Oreg Grape-Goldenseal (BERBERINE COMPLEX PO), Take 500 mg by mouth., Disp: , Rfl:    BIOTIN PO, Take 100 mg by mouth. , Disp: , Rfl:    CALCIUM PO, Take 1,000 mg by mouth. , Disp: , Rfl:    Cholecalciferol (VITAMIN D3) 50 MCG (2000 UT)  capsule, Take 2,000 Units by mouth daily., Disp: , Rfl:    co-enzyme Q-10 30 MG capsule, Take 30 mg by mouth daily. , Disp: , Rfl:    DiphenhydrAMINE HCl (BENADRYL ALLERGY PO), Take 25 mg by mouth as needed., Disp: , Rfl:    hydrocortisone 2.5 % cream, Apply to face twice daily for 2 wks, then daily for 2 wks, then daily as needed for itching, Disp: , Rfl:    latanoprost (XALATAN) 0.005 % ophthalmic solution, Place 1 drop into the left eye at bedtime., Disp: , Rfl:    Multiple Vitamin (MULTIVITAMIN) tablet, Take 1 tablet by mouth daily., Disp: , Rfl:    pravastatin  (PRAVACHOL ) 80 MG tablet, One tab po daily M-F, skip the weekends, Disp: 75 tablet, Rfl: 2   SPIRULINA PO, Take by mouth. As needed, Disp: , Rfl:    telmisartan  (MICARDIS ) 20 MG tablet, Take 1 tablet (20 mg total) by mouth daily., Disp: 90 tablet, Rfl: 2   timolol (TIMOPTIC) 0.5 % ophthalmic solution, , Disp: , Rfl:    vitamin C (ASCORBIC ACID) 500 MG tablet, Take 500 mg by mouth daily., Disp: , Rfl:    Allergies  Allergen Reactions   Almond Oil Hives    Almonds   Bean Pod Extract Hives    String Beans   Corn-Containing Products Hives   Peanut-Containing Drug Products Hives   Peanuts [Peanut Oil] Hives   Sodium Laureth Sulfate Hives   Soy Allergy (Obsolete) Hives   Strawberry Extract Hives   Tomato Hives      The patient states she uses post menopausal status for birth control. No LMP recorded. Patient has had a hysterectomy.. Negative for Dysmenorrhea. Negative for: breast discharge, breast lump(s), breast pain and breast self exam. Associated symptoms include abnormal vaginal bleeding. Pertinent negatives include abnormal bleeding (hematology), anxiety, decreased libido, depression, difficulty falling sleep, dyspareunia, history of infertility, nocturia, sexual dysfunction, sleep disturbances, urinary incontinence, urinary urgency, vaginal discharge and vaginal itching. Diet regular.The patient states her exercise level is   intermittent.  . The patient's tobacco use is:  Social History   Tobacco Use  Smoking Status Never  Smokeless Tobacco Never  . She has been exposed to passive smoke. The patient's alcohol use is:  Social History   Substance and Sexual Activity  Alcohol Use Not Currently   Comment: wine on occasion    Review of Systems  Constitutional: Negative.   HENT: Negative.    Eyes: Negative.  Negative for blurred vision.  Respiratory: Negative.    Cardiovascular: Negative.  Negative for chest pain.  Gastrointestinal: Negative.   Endocrine: Negative.   Genitourinary: Negative.   Musculoskeletal: Negative.   Skin: Negative.   Allergic/Immunologic: Negative.   Neurological: Negative.   Hematological: Negative.   Psychiatric/Behavioral: Negative.       Today's Vitals   06/18/24 1002  BP: 132/84  Pulse: 60  Temp: 98.3 F (36.8 C)  SpO2: 98%  Weight: 132 lb  6.4 oz (60.1 kg)  Height: 5' 2 (1.575 m)   Body mass index is 24.22 kg/m.  Wt Readings from Last 3 Encounters:  06/18/24 132 lb 6.4 oz (60.1 kg)  06/02/24 132 lb (59.9 kg)  03/11/24 132 lb 3.2 oz (60 kg)     Objective:  Physical Exam Vitals and nursing note reviewed.  Constitutional:      Appearance: Normal appearance.  HENT:     Head: Normocephalic and atraumatic.     Right Ear: Tympanic membrane, ear canal and external ear normal.     Left Ear: Tympanic membrane, ear canal and external ear normal.     Nose: Nose normal.     Mouth/Throat:     Mouth: Mucous membranes are moist.     Pharynx: Oropharynx is clear.  Eyes:     Extraocular Movements: Extraocular movements intact.     Conjunctiva/sclera: Conjunctivae normal.     Pupils: Pupils are equal, round, and reactive to light.  Cardiovascular:     Rate and Rhythm: Normal rate and regular rhythm.     Pulses: Normal pulses.          Dorsalis pedis pulses are 2+ on the right side and 2+ on the left side.     Heart sounds: Normal heart sounds.  Pulmonary:      Effort: Pulmonary effort is normal.     Breath sounds: Normal breath sounds.  Chest:  Breasts:    Tanner Score is 5.     Right: Normal.     Left: Normal.  Abdominal:     General: Abdomen is flat. Bowel sounds are normal.     Palpations: Abdomen is soft.  Genitourinary:    Comments: deferred Musculoskeletal:        General: Normal range of motion.     Cervical back: Normal range of motion and neck supple.  Feet:     Right foot:     Protective Sensation: 5 sites tested.  5 sites sensed.     Skin integrity: Skin integrity normal.     Toenail Condition: Right toenails are normal.     Left foot:     Protective Sensation: 5 sites tested.  5 sites sensed.     Skin integrity: Skin integrity normal.     Toenail Condition: Left toenails are normal.  Skin:    General: Skin is warm and dry.  Neurological:     General: No focal deficit present.     Mental Status: She is alert and oriented to person, place, and time.  Psychiatric:        Mood and Affect: Mood normal.        Behavior: Behavior normal.         Assessment And Plan:     Encounter for annual health examination Assessment & Plan: A full exam was performed.  Importance of monthly self breast exams was discussed with the patient.  She is advised to get 30-45 minutes of regular exercise, no less than four to five days per week. Both weight-bearing and aerobic exercises are recommended.  She is advised to follow a healthy diet with at least six fruits/veggies per day, decrease intake of red meat and other saturated fats and to increase fish intake to twice weekly.  Meats/fish should not be fried -- baked, boiled or broiled is preferable. It is also important to cut back on your sugar intake.  Be sure to read labels - try to avoid anything with added sugar, high  fructose corn syrup or other sweeteners.  If you must use a sweetener, you can try stevia or monkfruit.  It is also important to avoid artificially sweetened foods/beverages  and diet drinks. Lastly, wear SPF 50 sunscreen on exposed skin and when in direct sunlight for an extended period of time.  Be sure to avoid fast food restaurants and aim for at least 60 ounces of water daily.       Type 2 diabetes mellitus with stage 2 chronic kidney disease, without long-term current use of insulin (HCC) Assessment & Plan: Chronic, diabetic foot exam was performed.  Chronic, she has been able to control DM without meds. She will f/u in four months.  I will check an A1c today. She is encouraged to follow her regular exercise routine.  - Reminded to stay well hydrated to address CKD - I DISCUSSED WITH THE PATIENT AT LENGTH REGARDING THE GOALS OF GLYCEMIC CONTROL AND POSSIBLE LONG-TERM COMPLICATIONS.  I  ALSO STRESSED THE IMPORTANCE OF COMPLIANCE WITH HOME GLUCOSE MONITORING, DIETARY RESTRICTIONS INCLUDING AVOIDANCE OF SUGARY DRINKS/PROCESSED FOODS,  ALONG WITH REGULAR EXERCISE.  I  ALSO STRESSED THE IMPORTANCE OF ANNUAL EYE EXAMS, SELF FOOT CARE AND COMPLIANCE WITH OFFICE VISITS.    Parenchymal renal hypertension, stage 1 through stage 4 or unspecified chronic kidney disease Assessment & Plan: Chronic, fair control. Goal BP<130/80.  EKG performed, marked sinus bradycardia w/ HR 47.  She will continue with telmisartan  20mg  daily for now.  - If BP elevated at next visit, consider increasing telmisartan  dose to 40mg  daily.  - She is reminded to follow low sodium diet.  - she is encouraged to incorporate more exercise into her daily routine.   Orders: -     POCT urinalysis dipstick -     Microalbumin / creatinine urine ratio -     EKG 12-Lead  Pure hypercholesterolemia Assessment & Plan: Chronic, LDL goal is less than 70.  She will continue with pravastatin . Encouraged to incorporate more exercise into daily routine.   Orders: -     Lipid panel -     ALT  Osteopenia of neck of left femur Assessment & Plan: Last bone density performed April 2024.  She is due for repeat  testing April 2026.  - Encouraged to engage in weight-bearing exercises - Continue with calcium/vitamin D  supplementation.    Other orders -     Pravastatin  Sodium; One tab po daily M-F, skip the weekends  Dispense: 75 tablet; Refill: 2   Return for 1 YEAR HM, 4 MONTH DM F/U.SABRA Patient was given opportunity to ask questions. Patient verbalized understanding of the plan and was able to repeat key elements of the plan. All questions were answered to their satisfaction.   I, Ashley LOISE Slocumb, MD, have reviewed all documentation for this visit. The documentation on 06/18/24 for the exam, diagnosis, procedures, and orders are all accurate and complete.

## 2024-06-19 LAB — MICROALBUMIN / CREATININE URINE RATIO
Creatinine, Urine: 94.9 mg/dL
Microalb/Creat Ratio: 5 mg/g{creat} (ref 0–29)
Microalbumin, Urine: 4.5 ug/mL

## 2024-06-21 ENCOUNTER — Ambulatory Visit: Payer: Self-pay | Admitting: Internal Medicine

## 2024-06-21 DIAGNOSIS — M85852 Other specified disorders of bone density and structure, left thigh: Secondary | ICD-10-CM | POA: Insufficient documentation

## 2024-06-21 NOTE — Assessment & Plan Note (Signed)
 Chronic, diabetic foot exam was performed.  Chronic, she has been able to control DM without meds. She will f/u in four months.  I will check an A1c today. She is encouraged to follow her regular exercise routine.  - Reminded to stay well hydrated to address CKD - I DISCUSSED WITH THE PATIENT AT LENGTH REGARDING THE GOALS OF GLYCEMIC CONTROL AND POSSIBLE LONG-TERM COMPLICATIONS.  I  ALSO STRESSED THE IMPORTANCE OF COMPLIANCE WITH HOME GLUCOSE MONITORING, DIETARY RESTRICTIONS INCLUDING AVOIDANCE OF SUGARY DRINKS/PROCESSED FOODS,  ALONG WITH REGULAR EXERCISE.  I  ALSO STRESSED THE IMPORTANCE OF ANNUAL EYE EXAMS, SELF FOOT CARE AND COMPLIANCE WITH OFFICE VISITS.

## 2024-06-21 NOTE — Assessment & Plan Note (Signed)
 Last bone density performed April 2024.  She is due for repeat testing April 2026.  - Encouraged to engage in weight-bearing exercises - Continue with calcium/vitamin D  supplementation.

## 2024-06-21 NOTE — Assessment & Plan Note (Signed)

## 2024-06-21 NOTE — Assessment & Plan Note (Signed)
 Chronic, LDL goal is less than 70.  She will continue with pravastatin . Encouraged to incorporate more exercise into daily routine.

## 2024-06-21 NOTE — Assessment & Plan Note (Addendum)
 Chronic, fair control. Goal BP<130/80.  EKG performed, marked sinus bradycardia w/ HR 47.  She will continue with telmisartan  20mg  daily for now.  - If BP elevated at next visit, consider increasing telmisartan  dose to 40mg  daily.  - She is reminded to follow low sodium diet.  - she is encouraged to incorporate more exercise into her daily routine.

## 2024-07-22 ENCOUNTER — Ambulatory Visit
Admission: EM | Admit: 2024-07-22 | Discharge: 2024-07-22 | Disposition: A | Discharge status: Home / Self Care | Source: Home / Self Care

## 2024-07-22 DIAGNOSIS — T783XXA Angioneurotic edema, initial encounter: Secondary | ICD-10-CM

## 2024-07-22 MED ORDER — DEXAMETHASONE SOD PHOSPHATE PF 10 MG/ML IJ SOLN
8.0000 mg | Freq: Once | INTRAMUSCULAR | Status: AC
  Administered 2024-07-22: 18:00:00 8 mg via INTRAMUSCULAR

## 2024-07-22 MED ORDER — PREDNISONE 20 MG PO TABS: 40.0000 mg | ORAL_TABLET | Freq: Every day | ORAL | 0 refills | Status: AC

## 2024-07-22 MED ORDER — CETIRIZINE HCL 10 MG PO TABS
10.0000 mg | ORAL_TABLET | Freq: Every day | ORAL | Status: DC
  Administered 2024-07-22: 18:00:00 10 mg via ORAL

## 2024-07-22 NOTE — ED Triage Notes (Addendum)
 Pt states lip swelling since yesterday.  States she used a chap stick yesterday on her lips.  States she took a benadryl last night. Redness and swelling to mouth and lips.  Pt denies any difficulty breathing.

## 2024-07-22 NOTE — Discharge Instructions (Addendum)
 You were given a steroid injection as well as cetirizine  while here in the clinic.  You may start Claritin over-the-counter tomorrow, 12/18 as well as prednisone  daily for 5 days tomorrow.  Monitor your symptoms closely and if you notice any worsening symptoms which include were not limited to worsening lip swelling, tongue, throat, difficulty breathing or swallowing please call 911/go to the emergency room ASAP for further treatment.  Follow-up with your PCP in 2 days for recheck.  Hope you feel better soon!

## 2024-07-22 NOTE — ED Provider Notes (Signed)
 [facility]    CSN: [id number] Arrival date & time: 07/22/24  1626      History   Chief Complaint Chief Complaint  Patient presents with   Allergic Reaction    HPI Ashley Lyons is a 83 y.o. female presents for lip swelling.  Patient reports she used a Chapstick yesterday and since then has had some swelling of her lips and a rash around her mouth.  She denies any tongue/throat swelling, difficulty breathing or swallowing, chest pain or shortness of breath.  No new medications.  She states the swelling is slightly improved after she took Benadryl last night.  Has taken nothing today.  No other concerns at this time.   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   Vitamin D  deficiency 06/30/2023   Adjustment insomnia 06/30/2023   Acute pain of right knee 06/30/2023   Encounter for annual health examination 06/18/2023   Grief 03/06/2023   Primary hypertension 07/31/2022   Chronic renal disease, stage II 06/07/2021   Type 2 diabetes mellitus with stage 2 chronic kidney disease, without long-term current use of insulin (HCC) 04/15/2019   Neutropenia 04/15/2019   Decreased estrogen level 05/10/2018   Pure hypercholesterolemia 05/10/2018   Asymptomatic bradycardia 05/10/2018   Prediabetes 05/10/2018   Other chromoabnormalities of urine 05/10/2018   Leucopenia 06/19/2011    Past Surgical History:  Procedure Laterality Date   APPENDECTOMY     COLONOSCOPY     EYE SURGERY     TONSILLECTOMY     TUBAL LIGATION     VAGINAL HYSTERECTOMY      OB History   No obstetric history on file.      Home  Medications    Prior to Admission medications  Medication Sig Start Date End Date Taking? Authorizing Provider  predniSONE  (DELTASONE ) 20 MG tablet Take 2 tablets (40 mg total) by mouth daily with breakfast for 5 days. 07/23/24 07/28/24 Yes Loreda Myla SAUNDERS, NP  Barberry-Oreg Grape-Goldenseal (BERBERINE COMPLEX PO) Take 500 mg by mouth.    [provider]  BIOTIN PO Take 100 mg by mouth.     [provider]  CALCIUM PO Take 1,000 mg by mouth.     [provider]  Cholecalciferol (VITAMIN D3) 50 MCG (2000 UT) capsule Take 2,000 Units by mouth daily.    [provider]  co-enzyme Q-10 30 MG capsule Take 30 mg by mouth daily.     [provider]  DiphenhydrAMINE HCl (BENADRYL ALLERGY PO) Take 25 mg by mouth as needed.    [provider]  hydrocortisone 2.5 % cream Apply to face twice daily for 2 wks, then daily for 2 wks, then daily as needed for itching 10/19/19   [provider]  latanoprost (XALATAN) 0.005 % ophthalmic solution Place 1 drop into the left eye at bedtime. 05/01/22   [provider]  Multiple Vitamin (MULTIVITAMIN) tablet Take 1 tablet by mouth daily.    [provider]  pravastatin  (PRAVACHOL ) 80 MG tablet One tab po daily M-F, skip the weekends 06/18/24  Jarold Medici, MD  SPIRULINA PO Take by mouth. As needed    [provider]  telmisartan  (MICARDIS ) 20 MG tablet Take 1 tablet (20 mg total) by mouth daily. 06/02/24   Jarold Medici, MD  timolol (TIMOPTIC) 0.5 % ophthalmic solution  05/15/12   [provider]  vitamin C (ASCORBIC ACID) 500 MG tablet Take 500 mg by mouth daily.    [provider]    Family History Family History  Problem Relation Age of Onset   Prostate cancer Father    Cancer Father    Heart disease Father    Hypertension Father    Diabetes Mother    Prostate cancer Brother    Cancer Brother    Kidney disease Brother    Kidney disease Sister     Depression Sister    Diabetes Sister    Liver disease Brother    Colon cancer Brother    Esophageal cancer Neg Hx    Rectal cancer Neg Hx    Stomach cancer Neg Hx     Social History Social History[1]   Allergies   Almond oil, Bean pod extract, Corn-containing products, Peanut-containing drug products, Peanuts [peanut oil], Sodium laureth sulfate, Soy allergy (obsolete), Strawberry extract, and Tomato   Review of Systems Review of Systems  HENT:         Lip swelling     Physical Exam Triage Vital Signs ED Triage Vitals  Encounter Vitals Group     BP 07/22/24 1710 (!) 182/77     Girls Systolic BP Percentile --      Girls Diastolic BP Percentile --      Boys Systolic BP Percentile --      Boys Diastolic BP Percentile --      Pulse Rate 07/22/24 1710 (!) 54     Resp 07/22/24 1710 16     Temp 07/22/24 1710 98.6 F (37 C)     Temp Source 07/22/24 1710 Oral     SpO2 07/22/24 1710 96 %     Weight --      Height --      Head Circumference --      Peak Flow --      Pain Score 07/22/24 1709 0     Pain Loc --      Pain Education --      Exclude from Growth Chart --    No data found.  Updated Vital Signs BP (!) 152/82 (BP Location: Left Arm)   Pulse (!) 54   Temp 98.6 F (37 C) (Oral)   Resp 16   SpO2 96%   Visual Acuity Right Eye Distance:   Left Eye Distance:   Bilateral Distance:    Right Eye Near:   Left Eye Near:    Bilateral Near:     Physical Exam Vitals and nursing note reviewed.  Constitutional:      General: She is not in acute distress.    Appearance: Normal appearance. She is not ill-appearing, toxic-appearing or diaphoretic.  HENT:     Head: Normocephalic and atraumatic.     Mouth/Throat:     Lips: Pink.     Mouth: Mucous membranes are moist. Angioedema present.     Tongue: Tongue does not deviate from midline.     Pharynx: Oropharynx is clear. Uvula midline. No pharyngeal swelling or uvula swelling.     Comments: Airway is patent with  midline uvula.  Patient speaking in complete sentences without difficulty.  There is  a mildly erythematous macular rash around the lips.  No facial swelling. Neurological:     Mental Status: She is alert.      UC Treatments / Results  Labs (all labs ordered are listed, but only abnormal results are displayed) Labs Reviewed - No data to display [id number] Order: [id number]  Status: Final result     Next appt: 10/29/2024 at 11:20 AM in Internal Medicine Sherel LOISE Sloc[place], MD)     Dx: Type 2 diabetes mellitus with stage 2...   Test Result Released: Yes (seen)     Messages: Seen   1 Result Note     1 Patient Communication     View Follow-Up Encounter     1 HM Topic  important suggestion  Newer results are available. Click to view them now.            Component Ref Range & Units (hover) 1 mo ago (06/02/24) 5 mo ago (01/28/24) 5 mo ago (01/28/24) 8 mo ago (10/28/23) 1 yr ago (06/18/23) 1 yr ago (03/06/23) 1 yr ago (10/16/22)  Glucose 117 High   101 High  103 High  102 High  115 High  110 High   BUN 14  14 14 15 12 13   Creatinine, Ser 0.99  0.81 0.74 0.82 0.82 0.80  eGFR 56 Low   71 79 70 70 73  BUN/Creatinine Ratio 14  17 19 18 15 16   Sodium 139  138 141 140 141 141  Potassium 4.7  4.2 4.6 4.5 4.7 4.3  Chloride 101  102 103 102 104 104  CO2 23  22 23 23 23 24   Calcium 9.6  9.1 9.3 9.3 9.3 9.3  Total Protein 7.4   7.3 7.5 7.3 7.1  Albumin 4.3   4.1 4.3 4.0 4.2  Globulin, Total 3.1   3.2 3.2 3.3 2.9  Bilirubin Total 0.7   0.4 0.5 0.5 0.6  Alkaline Phosphatase 53   59 R 63 R 59 R 63 R  AST 21   26 19 20 19   ALT 10 9  13 10 12 9   Resulting Agency [facility]         Narrative Performed by: HOYT Performed at:  [address] [facility] [address] [place], [place]  [id number] Lab Director: Frankey Sas MD, Phone:  [phone]  Specimen Collected: 06/02/24 12:19 Last Resulted: 06/03/24 22:35   EKG   Radiology No  results found.  Procedures Procedures (including critical care time)  Medications Ordered in UC Medications  cetirizine  (ZYRTEC ) tablet 10 mg (10 mg Oral Given 07/22/24 1744)  dexamethasone  (DECADRON ) injection 8 mg (8 mg Intramuscular Given 07/22/24 1752)    Initial Impression / Assessment and Plan / UC Course  I have reviewed the triage vital signs and the nursing notes.  Pertinent labs & imaging results that were available during my care of the patient were reviewed by me and considered in my medical decision making (see chart for details).  Clinical Course as of 07/22/24 1825  Wed Jul 22, 2024  1724 BP recheck 168/84 [JM]    Clinical Course User Index [JM] Loreda Myla SAUNDERS, NP    Patient given cetirizine  and dexamethasone  injection in clinic.  She was monitored for 30 minutes after injection with improvement in symptoms.  She continues to deny any shortness of breath, difficulty breathing, tongue swelling or throat swelling.  Patient would like to go home and monitor her symptoms.  Advise she can  continue Claritin which she has at home tomorrow as well as start prednisone  daily for 5 days tomorrow, 12/17.  She was instructed to monitor symptoms closely and go to the ER for any worsening symptoms, red flags reviewed.  PCP follow-up 2 days for recheck. Final Clinical Impressions(s) / UC Diagnoses   Final diagnoses:  Angioedema, initial encounter     Discharge Instructions      You were given a steroid injection as well as cetirizine  while here in the clinic.  You may start Claritin over-the-counter tomorrow, 12/18 as well as prednisone  daily for 5 days tomorrow.  Monitor your symptoms closely and if you notice any worsening symptoms which include were not limited to worsening lip swelling, tongue, throat, difficulty breathing or swallowing please call 911/go to the emergency room ASAP for further treatment.  Follow-up with your PCP in 2 days for recheck.  Hope you feel better  soon!     ED Prescriptions     Medication Sig Dispense Auth. Provider   predniSONE  (DELTASONE ) 20 MG tablet Take 2 tablets (40 mg total) by mouth daily with breakfast for 5 days. 10 tablet Namya Voges, Jodi R, NP      PDMP not reviewed this encounter.     [1]  Social History Tobacco Use   Smoking status: Never   Smokeless tobacco: Never  Vaping Use   Vaping status: Never Used  Substance Use Topics   Alcohol use: Not Currently    Comment: wine on occasion   Drug use: No     Loreda Myla SAUNDERS, NP 07/22/24 1825

## 2024-07-24 ENCOUNTER — Ambulatory Visit: Admitting: Family Medicine

## 2024-07-24 ENCOUNTER — Encounter: Payer: Self-pay | Admitting: Family Medicine

## 2024-07-24 VITALS — BP 120/64 | HR 93 | Temp 98.2°F | Ht 62.0 in | Wt 133.0 lb

## 2024-07-24 DIAGNOSIS — T783XXD Angioneurotic edema, subsequent encounter: Secondary | ICD-10-CM | POA: Diagnosis not present

## 2024-07-24 DIAGNOSIS — N182 Chronic kidney disease, stage 2 (mild): Secondary | ICD-10-CM

## 2024-07-24 DIAGNOSIS — E1122 Type 2 diabetes mellitus with diabetic chronic kidney disease: Secondary | ICD-10-CM | POA: Diagnosis not present

## 2024-07-24 NOTE — Progress Notes (Signed)
 I,Jameka J Llittleton, CMA,acting as a neurosurgeon for [employer], NP.,have documented all relevant documentation on the behalf of Bruna Creighton, NP,as directed by  Bruna Creighton, NP while in the presence of Bruna Creighton, NP.  Subjective:  Patient ID: Ashley Lyons , female    DOB: 10-16-1940 , 83 y.o.   MRN: [id number]  Chief Complaint  Patient presents with   Allergic Reaction    Patient reports she used a lip balm on Monday night and her lips swelled overnight so she went to urgent care. Patient reports she started her prednisone  this morning. She reports her lips still dry.     HPI Discussed the use of AI scribe software for clinical note transcription with the patient, who gave verbal consent to proceed.  History of Present Illness   Ashley Lyons is an 83 year old female with sensitive skin who presents with a lip reaction after using chapstick.  On Monday, she used chapstick from her grandson's orthodontist bag due to chapped lips. Overnight, she developed what she thought was a cold sore, which worsened despite applying Vaseline and cold compresses. By Tuesday, her granddaughter noticed the condition and consulted another granddaughter, a doctor, via Facetime, who advised urgent care. At urgent care, she received a shot, Zyrtec , and a prescription for prednisone .  She has a history of sensitive skin and allergies, avoiding products with parabens and sodium lauryl sulfate. She noted that the chapstick contained peppermint. She experienced lip swelling and redness, particularly at the corners of her lips. The swelling has since reduced, but some redness and soreness persist.  She has type 2 diabetes managed with berberine and does not regularly monitor her blood sugar. She noticed increased urination, which she attributes to increased water intake due to thirst.  She also takes medication for cholesterol and blood pressure, and uses Vanicream as a moisturizer.     Ashley Lyons is an 83  year old female with sensitive skin who presents with a lip reaction after using chapstick.  On Monday, she used chapstick from her grandson's orthodontist bag due to chapped lips. Overnight, she developed what she thought was a cold sore, which worsened despite applying Vaseline and cold compresses. By Tuesday, her granddaughter noticed the condition and consulted another granddaughter, a doctor, via Facetime, who advised urgent care. At urgent care, she received a shot, Zyrtec , and a prescription for prednisone .  She has a history of sensitive skin and allergies, avoiding products with parabens and sodium lauryl sulfate. She noted that the chapstick contained peppermint. She experienced lip swelling and redness, particularly at the corners of her lips. The swelling has since reduced, but some redness and soreness persist.  She has type 2 diabetes managed with berberine and does not regularly monitor her blood sugar. She noticed increased urination, which she attributes to increased water intake due to thirst.  She also takes medication for cholesterol and blood pressure, and uses Vanicream as a moisturizer.          Past Medical History:  Diagnosis Date   Allergy    Chronic kidney disease    Diabetes mellitus    GERD (gastroesophageal reflux disease)    Glaucoma    right eye   Hyperlipidemia    Leukopenia    Primary hypertension 07/31/2022     Family History  Problem Relation Age of Onset   Prostate cancer Father    Cancer Father    Heart disease Father    Hypertension Father  Diabetes Mother    Prostate cancer Brother    Cancer Brother    Kidney disease Brother    Kidney disease Sister    Depression Sister    Diabetes Sister    Liver disease Brother    Colon cancer Brother    Esophageal cancer Neg Hx    Rectal cancer Neg Hx    Stomach cancer Neg Hx     Current Medications[1]   Allergies[2]   Review of Systems  Constitutional: Negative.   HENT: Negative.   Negative for trouble swallowing.   Respiratory:  Negative for choking, chest tightness and stridor.   Musculoskeletal: Negative.   Skin:  Positive for rash.  Allergic/Immunologic: Positive for environmental allergies and food allergies.     Today's Vitals   07/24/24 1135  BP: 120/64  Pulse: 93  Temp: 98.2 F (36.8 C)  TempSrc: Oral  Weight: 133 lb (60.3 kg)  Height: 5' 2 (1.575 m)  PainSc: 0-No pain   Body mass index is 24.33 kg/m.  Wt Readings from Last 3 Encounters:  07/24/24 133 lb (60.3 kg)  06/18/24 132 lb 6.4 oz (60.1 kg)  06/02/24 132 lb (59.9 kg)    The ASCVD Risk score (Arnett DK, et al., 2019) failed to calculate for the following reasons:   The 2019 ASCVD risk score is only valid for ages 66 to 35   * - Cholesterol units were assumed  Objective:  Physical Exam Constitutional:      Appearance: Normal appearance.  HENT:     Mouth/Throat:     Mouth: Angioedema present.     Pharynx: No pharyngeal swelling.     Comments: Slight swelling of her lip after using a chapped lipstick,but denies any tongue swelling or difficulty swallowing. Cardiovascular:     Rate and Rhythm: Normal rate and regular rhythm.     Pulses: Normal pulses.     Heart sounds: Normal heart sounds.  Pulmonary:     Effort: Pulmonary effort is normal.     Breath sounds: Normal breath sounds.  Abdominal:     General: Bowel sounds are normal.  Neurological:     Mental Status: She is alert.         Assessment And Plan:   Assessment & Plan Allergic angioedema, subsequent encounter Angioedema of the lips Acute angioedema likely due to contact allergy from orthodontist chapstick. Improved with treatment, no systemic reaction observed. - Continue Claritin as prescribed. - Continue prednisone  for five days as prescribed. - Apply Vaseline for moisture to affected area.  Type 2 diabetes mellitus with stage 2 chronic kidney disease, without long-term current use of insulin (HCC) Managed with  berberine. Advised her prednisone  may elevate blood sugar, requiring dietary adjustments. - Monitor blood sugar levels during prednisone  treatment. - Reduce carbohydrate intake during prednisone  treatment.      Return if symptoms worsen or fail to improve, for keep next appt.  Patient was given opportunity to ask questions. Patient verbalized understanding of the plan and was able to repeat key elements of the plan. All questions were answered to their satisfaction.    I, Bruna Creighton, NP, have reviewed all documentation for this visit. The documentation on 07/30/2024 for the exam, diagnosis, procedures, and orders are all accurate and complete.   IF YOU HAVE BEEN REFERRED TO A SPECIALIST, IT MAY TAKE 1-2 WEEKS TO SCHEDULE/PROCESS THE REFERRAL. IF YOU HAVE NOT HEARD FROM US /SPECIALIST IN TWO WEEKS, PLEASE GIVE US  A CALL AT [phone].      [  1]  Current Outpatient Medications:    Barberry-Oreg Grape-Goldenseal (BERBERINE COMPLEX PO), Take 500 mg by mouth., Disp: , Rfl:    BIOTIN PO, Take 100 mg by mouth. , Disp: , Rfl:    CALCIUM PO, Take 1,000 mg by mouth. , Disp: , Rfl:    Cholecalciferol (VITAMIN D3) 50 MCG (2000 UT) capsule, Take 2,000 Units by mouth daily., Disp: , Rfl:    co-enzyme Q-10 30 MG capsule, Take 30 mg by mouth daily. , Disp: , Rfl:    DiphenhydrAMINE HCl (BENADRYL ALLERGY PO), Take 25 mg by mouth as needed., Disp: , Rfl:    hydrocortisone 2.5 % cream, Apply to face twice daily for 2 wks, then daily for 2 wks, then daily as needed for itching, Disp: , Rfl:    latanoprost (XALATAN) 0.005 % ophthalmic solution, Place 1 drop into the left eye at bedtime., Disp: , Rfl:    Multiple Vitamin (MULTIVITAMIN) tablet, Take 1 tablet by mouth daily., Disp: , Rfl:    pravastatin  (PRAVACHOL ) 80 MG tablet, One tab po daily M-F, skip the weekends, Disp: 75 tablet, Rfl: 2   SPIRULINA PO, Take by mouth. As needed, Disp: , Rfl:    telmisartan  (MICARDIS ) 20 MG tablet, Take 1 tablet (20  mg total) by mouth daily., Disp: 90 tablet, Rfl: 2   timolol (TIMOPTIC) 0.5 % ophthalmic solution, , Disp: , Rfl:    vitamin C (ASCORBIC ACID) 500 MG tablet, Take 500 mg by mouth daily., Disp: , Rfl:  [2]  Allergies Allergen Reactions   Almond Oil Hives    Almonds   Bean Pod Extract Hives    String Beans   Corn-Containing Products Hives   Peanut-Containing Drug Products Hives   Peanuts [Peanut Oil] Hives   Sodium Laureth Sulfate Hives   Soy Allergy (Obsolete) Hives   Strawberry Extract Hives   Tomato Hives

## 2024-07-31 DIAGNOSIS — T783XXA Angioneurotic edema, initial encounter: Secondary | ICD-10-CM | POA: Insufficient documentation

## 2024-07-31 NOTE — Assessment & Plan Note (Addendum)
 Managed with berberine. Advised her prednisone  may elevate blood sugar, requiring dietary adjustments. - Monitor blood sugar levels during prednisone  treatment. - Reduce carbohydrate intake during prednisone  treatment.

## 2024-07-31 NOTE — Assessment & Plan Note (Signed)
 Angioedema of the lips Acute angioedema likely due to contact allergy from orthodontist chapstick. Improved with treatment, no systemic reaction observed. - Continue Claritin as prescribed. - Continue prednisone  for five days as prescribed. - Apply Vaseline for moisture to affected area.

## 2024-10-29 ENCOUNTER — Ambulatory Visit: Admitting: Internal Medicine

## 2025-06-24 ENCOUNTER — Encounter: Payer: Self-pay | Admitting: Internal Medicine
